# Patient Record
Sex: Female | Born: 1955
Health system: Southern US, Community
[De-identification: ages and names within clinical notes are randomized; demographics above are authoritative.]

## PROBLEM LIST (undated history)

## (undated) DIAGNOSIS — F419 Anxiety disorder, unspecified: Secondary | ICD-10-CM

## (undated) DIAGNOSIS — T7840XA Allergy, unspecified, initial encounter: Secondary | ICD-10-CM

## (undated) DIAGNOSIS — I8390 Asymptomatic varicose veins of unspecified lower extremity: Secondary | ICD-10-CM

## (undated) DIAGNOSIS — M549 Dorsalgia, unspecified: Secondary | ICD-10-CM

## (undated) DIAGNOSIS — N950 Postmenopausal bleeding: Secondary | ICD-10-CM

## (undated) DIAGNOSIS — K219 Gastro-esophageal reflux disease without esophagitis: Secondary | ICD-10-CM

## (undated) DIAGNOSIS — F329 Major depressive disorder, single episode, unspecified: Secondary | ICD-10-CM

## (undated) DIAGNOSIS — F32A Depression, unspecified: Secondary | ICD-10-CM

## (undated) DIAGNOSIS — R51 Headache: Secondary | ICD-10-CM

## (undated) DIAGNOSIS — H269 Unspecified cataract: Secondary | ICD-10-CM

## (undated) DIAGNOSIS — M8580 Other specified disorders of bone density and structure, unspecified site: Secondary | ICD-10-CM

## (undated) HISTORY — PX: APPENDECTOMY: SHX54

## (undated) HISTORY — DX: Asymptomatic varicose veins of unspecified lower extremity: I83.90

## (undated) HISTORY — PX: COLONOSCOPY: SHX174

## (undated) HISTORY — DX: Anxiety disorder, unspecified: F41.9

## (undated) HISTORY — DX: Other specified disorders of bone density and structure, unspecified site: M85.80

## (undated) HISTORY — PX: TONSILLECTOMY: SHX5217

## (undated) HISTORY — DX: Allergy, unspecified, initial encounter: T78.40XA

## (undated) HISTORY — DX: Major depressive disorder, single episode, unspecified: F32.9

## (undated) HISTORY — DX: Depression, unspecified: F32.A

## (undated) HISTORY — DX: Unspecified cataract: H26.9

## (undated) HISTORY — DX: Gastro-esophageal reflux disease without esophagitis: K21.9

---

## 1997-07-04 ENCOUNTER — Other Ambulatory Visit: Admission: RE | Admit: 1997-07-04 | Discharge: 1997-07-04 | Payer: Self-pay | Admitting: Obstetrics and Gynecology

## 1998-08-17 ENCOUNTER — Other Ambulatory Visit: Admission: RE | Admit: 1998-08-17 | Discharge: 1998-08-17 | Payer: Self-pay | Admitting: Obstetrics and Gynecology

## 1998-08-26 ENCOUNTER — Encounter: Payer: Self-pay | Admitting: Emergency Medicine

## 1998-08-26 ENCOUNTER — Inpatient Hospital Stay (HOSPITAL_COMMUNITY): Admission: EM | Admit: 1998-08-26 | Discharge: 1998-08-27 | Payer: Self-pay | Admitting: Emergency Medicine

## 1999-11-12 ENCOUNTER — Other Ambulatory Visit: Admission: RE | Admit: 1999-11-12 | Discharge: 1999-11-12 | Payer: Self-pay | Admitting: Obstetrics and Gynecology

## 1999-11-13 ENCOUNTER — Encounter (INDEPENDENT_AMBULATORY_CARE_PROVIDER_SITE_OTHER): Payer: Self-pay | Admitting: Specialist

## 1999-11-13 ENCOUNTER — Other Ambulatory Visit: Admission: RE | Admit: 1999-11-13 | Discharge: 1999-11-13 | Payer: Self-pay | Admitting: Obstetrics and Gynecology

## 2000-11-17 ENCOUNTER — Other Ambulatory Visit: Admission: RE | Admit: 2000-11-17 | Discharge: 2000-11-17 | Payer: Self-pay | Admitting: Obstetrics and Gynecology

## 2001-11-20 ENCOUNTER — Other Ambulatory Visit: Admission: RE | Admit: 2001-11-20 | Discharge: 2001-11-20 | Payer: Self-pay | Admitting: Obstetrics and Gynecology

## 2002-04-15 DIAGNOSIS — M8580 Other specified disorders of bone density and structure, unspecified site: Secondary | ICD-10-CM

## 2002-04-15 HISTORY — DX: Other specified disorders of bone density and structure, unspecified site: M85.80

## 2003-02-14 ENCOUNTER — Other Ambulatory Visit: Admission: RE | Admit: 2003-02-14 | Discharge: 2003-02-14 | Payer: Self-pay | Admitting: Obstetrics and Gynecology

## 2004-06-04 ENCOUNTER — Ambulatory Visit: Payer: Self-pay | Admitting: Internal Medicine

## 2005-04-24 ENCOUNTER — Ambulatory Visit: Payer: Self-pay | Admitting: Internal Medicine

## 2005-06-03 ENCOUNTER — Ambulatory Visit: Payer: Self-pay | Admitting: Internal Medicine

## 2005-11-04 ENCOUNTER — Ambulatory Visit: Payer: Self-pay | Admitting: Internal Medicine

## 2006-01-03 ENCOUNTER — Ambulatory Visit: Payer: Self-pay | Admitting: Internal Medicine

## 2006-04-25 ENCOUNTER — Ambulatory Visit: Payer: Self-pay | Admitting: Internal Medicine

## 2006-05-20 ENCOUNTER — Ambulatory Visit: Payer: Self-pay | Admitting: Internal Medicine

## 2006-05-20 LAB — CONVERTED CEMR LAB
ALT: 12 units/L (ref 0–40)
AST: 26 units/L (ref 0–37)
Albumin: 3.6 g/dL (ref 3.5–5.2)
Alkaline Phosphatase: 101 units/L (ref 39–117)
BUN: 11 mg/dL (ref 6–23)
Basophils Absolute: 0.1 10*3/uL (ref 0.0–0.1)
Basophils Relative: 1 % (ref 0.0–1.0)
Bilirubin, Direct: 0.1 mg/dL (ref 0.0–0.3)
CO2: 26 meq/L (ref 19–32)
Calcium: 9 mg/dL (ref 8.4–10.5)
Chloride: 110 meq/L (ref 96–112)
Cholesterol: 182 mg/dL (ref 0–200)
Creatinine, Ser: 0.9 mg/dL (ref 0.4–1.2)
Eosinophils Absolute: 0.2 10*3/uL (ref 0.0–0.6)
Eosinophils Relative: 2.9 % (ref 0.0–5.0)
GFR calc Af Amer: 85 mL/min
GFR calc non Af Amer: 70 mL/min
Glucose, Bld: 88 mg/dL (ref 70–99)
HCT: 44.2 % (ref 36.0–46.0)
HDL: 47.9 mg/dL (ref >39.0)
Hemoglobin: 15.1 g/dL — ABNORMAL HIGH (ref 12.0–15.0)
LDL Cholesterol: 117 mg/dL — ABNORMAL HIGH (ref 0–99)
Lymphocytes Relative: 23.9 % (ref 12.0–46.0)
MCHC: 34.1 g/dL (ref 30.0–36.0)
MCV: 89.4 fL (ref 78.0–100.0)
Monocytes Absolute: 0.4 10*3/uL (ref 0.2–0.7)
Monocytes Relative: 5.7 % (ref 3.0–11.0)
Neutro Abs: 4.2 10*3/uL (ref 1.4–7.7)
Neutrophils Relative %: 66.5 % (ref 43.0–77.0)
Platelets: 237 10*3/uL (ref 150–400)
Potassium: 3.9 meq/L (ref 3.5–5.1)
RBC: 4.94 M/uL (ref 3.87–5.11)
RDW: 12.5 % (ref 11.5–14.6)
Sodium: 141 meq/L (ref 135–145)
Total Bilirubin: 0.5 mg/dL (ref 0.3–1.2)
Total CHOL/HDL Ratio: 3.8
Total Protein: 6.6 g/dL (ref 6.0–8.3)
Triglycerides: 88 mg/dL (ref 0–149)
VLDL: 18 mg/dL (ref 0–40)
WBC: 6.4 10*3/uL (ref 4.5–10.5)

## 2006-05-23 ENCOUNTER — Ambulatory Visit: Payer: Self-pay | Admitting: Internal Medicine

## 2006-08-22 ENCOUNTER — Ambulatory Visit: Payer: Self-pay | Admitting: Internal Medicine

## 2007-03-02 ENCOUNTER — Telehealth: Payer: Self-pay | Admitting: Internal Medicine

## 2007-04-06 ENCOUNTER — Encounter (INDEPENDENT_AMBULATORY_CARE_PROVIDER_SITE_OTHER): Payer: Self-pay | Admitting: *Deleted

## 2007-04-21 ENCOUNTER — Ambulatory Visit: Payer: Self-pay | Admitting: Internal Medicine

## 2007-05-01 ENCOUNTER — Ambulatory Visit: Payer: Self-pay | Admitting: Internal Medicine

## 2007-05-05 ENCOUNTER — Encounter: Payer: Self-pay | Admitting: Internal Medicine

## 2007-05-05 ENCOUNTER — Ambulatory Visit: Payer: Self-pay | Admitting: Internal Medicine

## 2007-11-20 ENCOUNTER — Ambulatory Visit: Payer: Self-pay | Admitting: Internal Medicine

## 2007-11-27 ENCOUNTER — Ambulatory Visit: Payer: Self-pay | Admitting: Internal Medicine

## 2007-11-30 LAB — CONVERTED CEMR LAB
ALT: 12 units/L (ref 0–35)
AST: 22 units/L (ref 0–37)
BUN: 10 mg/dL (ref 6–23)
Basophils Absolute: 0.1 10*3/uL (ref 0.0–0.1)
Basophils Relative: 0.9 % (ref 0.0–3.0)
CO2: 27 meq/L (ref 19–32)
Calcium: 8.9 mg/dL (ref 8.4–10.5)
Chloride: 108 meq/L (ref 96–112)
Cholesterol: 203 mg/dL (ref 0–200)
Creatinine, Ser: 0.9 mg/dL (ref 0.4–1.2)
Direct LDL: 141.7 mg/dL
Eosinophils Absolute: 0.2 10*3/uL (ref 0.0–0.7)
Eosinophils Relative: 3 % (ref 0.0–5.0)
GFR calc Af Amer: 85 mL/min
GFR calc non Af Amer: 70 mL/min
Glucose, Bld: 82 mg/dL (ref 70–99)
HCT: 40.6 % (ref 36.0–46.0)
HDL: 53.6 mg/dL (ref >39.0)
Hemoglobin: 14.4 g/dL (ref 12.0–15.0)
Lymphocytes Relative: 28.4 % (ref 12.0–46.0)
MCHC: 35.5 g/dL (ref 30.0–36.0)
MCV: 88.1 fL (ref 78.0–100.0)
Monocytes Absolute: 0.4 10*3/uL (ref 0.1–1.0)
Monocytes Relative: 6.8 % (ref 3.0–12.0)
Neutro Abs: 3.5 10*3/uL (ref 1.4–7.7)
Neutrophils Relative %: 60.9 % (ref 43.0–77.0)
Platelets: 228 10*3/uL (ref 150–400)
Potassium: 4.2 meq/L (ref 3.5–5.1)
RBC: 4.61 M/uL (ref 3.87–5.11)
RDW: 12.6 % (ref 11.5–14.6)
Sodium: 140 meq/L (ref 135–145)
TSH: 1.76 microintl units/mL (ref 0.35–5.50)
Total CHOL/HDL Ratio: 3.8
Triglycerides: 98 mg/dL (ref 0–149)
VLDL: 20 mg/dL (ref 0–40)
WBC: 5.9 10*3/uL (ref 4.5–10.5)

## 2008-03-07 ENCOUNTER — Telehealth: Payer: Self-pay | Admitting: Internal Medicine

## 2008-05-03 ENCOUNTER — Encounter: Payer: Self-pay | Admitting: Internal Medicine

## 2008-07-12 ENCOUNTER — Ambulatory Visit: Payer: Self-pay | Admitting: Internal Medicine

## 2008-08-05 ENCOUNTER — Encounter: Payer: Self-pay | Admitting: Internal Medicine

## 2008-08-13 LAB — CONVERTED CEMR LAB: Pap Smear: NORMAL

## 2008-09-06 ENCOUNTER — Telehealth (INDEPENDENT_AMBULATORY_CARE_PROVIDER_SITE_OTHER): Payer: Self-pay | Admitting: *Deleted

## 2008-09-13 ENCOUNTER — Encounter: Payer: Self-pay | Admitting: Internal Medicine

## 2008-09-20 ENCOUNTER — Encounter: Payer: Self-pay | Admitting: Internal Medicine

## 2008-11-28 ENCOUNTER — Ambulatory Visit: Payer: Self-pay | Admitting: Internal Medicine

## 2008-12-08 ENCOUNTER — Ambulatory Visit: Payer: Self-pay | Admitting: Internal Medicine

## 2008-12-12 LAB — CONVERTED CEMR LAB
ALT: 14 units/L (ref 0–35)
AST: 21 units/L (ref 0–37)
BUN: 13 mg/dL (ref 6–23)
CO2: 27 meq/L (ref 19–32)
Calcium: 8.8 mg/dL (ref 8.4–10.5)
Chloride: 108 meq/L (ref 96–112)
Cholesterol: 214 mg/dL — ABNORMAL HIGH (ref 0–200)
Creatinine, Ser: 0.9 mg/dL (ref 0.4–1.2)
Direct LDL: 145.9 mg/dL
GFR calc non Af Amer: 69.59 mL/min (ref >60)
Glucose, Bld: 85 mg/dL (ref 70–99)
HDL: 52.5 mg/dL (ref >39.00)
Potassium: 4.2 meq/L (ref 3.5–5.1)
Sodium: 141 meq/L (ref 135–145)
TSH: 1.2 microintl units/mL (ref 0.35–5.50)
Total CHOL/HDL Ratio: 4
Triglycerides: 105 mg/dL (ref 0.0–149.0)
VLDL: 21 mg/dL (ref 0.0–40.0)

## 2009-04-21 ENCOUNTER — Telehealth: Payer: Self-pay | Admitting: Internal Medicine

## 2009-05-31 ENCOUNTER — Ambulatory Visit: Payer: Self-pay | Admitting: Internal Medicine

## 2009-06-08 ENCOUNTER — Encounter: Payer: Self-pay | Admitting: Internal Medicine

## 2009-06-08 LAB — HM MAMMOGRAPHY

## 2009-06-21 ENCOUNTER — Encounter: Payer: Self-pay | Admitting: Internal Medicine

## 2009-06-26 ENCOUNTER — Encounter: Payer: Self-pay | Admitting: Internal Medicine

## 2009-09-19 ENCOUNTER — Encounter: Payer: Self-pay | Admitting: Internal Medicine

## 2009-09-19 ENCOUNTER — Telehealth: Payer: Self-pay | Admitting: Internal Medicine

## 2009-11-16 ENCOUNTER — Ambulatory Visit: Payer: Self-pay | Admitting: Internal Medicine

## 2009-11-17 ENCOUNTER — Ambulatory Visit: Payer: Self-pay | Admitting: Internal Medicine

## 2009-11-23 LAB — CONVERTED CEMR LAB
ALT: 13 units/L (ref 0–35)
AST: 21 units/L (ref 0–37)
BUN: 19 mg/dL (ref 6–23)
Basophils Absolute: 0 10*3/uL (ref 0.0–0.1)
Basophils Relative: 0.6 % (ref 0.0–3.0)
Chloride: 108 meq/L (ref 96–112)
Direct LDL: 152.2 mg/dL
Eosinophils Absolute: 0.2 10*3/uL (ref 0.0–0.7)
Lymphocytes Relative: 27.7 % (ref 12.0–46.0)
MCHC: 34 g/dL (ref 30.0–36.0)
Neutrophils Relative %: 63.1 % (ref 43.0–77.0)
Potassium: 4.7 meq/L (ref 3.5–5.1)
RBC: 4.8 M/uL (ref 3.87–5.11)
TSH: 1.4 microintl units/mL (ref 0.35–5.50)
Total CHOL/HDL Ratio: 4
Triglycerides: 59 mg/dL (ref 0.0–149.0)

## 2010-02-05 ENCOUNTER — Telehealth: Payer: Self-pay | Admitting: Internal Medicine

## 2010-05-13 LAB — CONVERTED CEMR LAB
Pap Smear: NEGATIVE
Vit D, 25-Hydroxy: 45.2 ng/mL

## 2010-05-15 NOTE — Progress Notes (Signed)
Summary: Refill Request  Phone Note Refill Request Call back at [phone] Message from:  Pharmacy on September 19, 2009 9:58 AM  Refills Requested: Medication #1:  ALPRAZOLAM 0.5 MG  TABS 1 1/2 qam; 1 by mouth qpm fax [phone]   Dosage confirmed as above?Dosage Confirmed   Supply Requested: 1 month   Last Refilled: 07/20/2009 [facility] [address]  Next Appointment Scheduled: none Initial call taken by: Harold Barban,  September 19, 2009 9:58 AM  Follow-up for Phone Call        last filled, last OV 05-31-09 #60 1..............Marland KitchenFelecia Deloach CMA  September 19, 2009 10:55 AM   ok 60 and 2 RF Jose E. Paz MD  September 19, 2009 5:31 PM     Prescriptions: ALPRAZOLAM 0.5 MG  TABS (ALPRAZOLAM) 1 1/2 qam; 1 by mouth qpm fax [phone]  #60 x 2   Entered by:   Jeremy Johann CMA   Authorized by:   Nolon Rod. Paz MD   Signed by:   Jeremy Johann CMA on 09/20/2009   Method used:   Printed then faxed to ...       [facility] [address] (retail)       [address]       [place], [place]  [address]       Ph: [phone] or [phone]       Fax: [phone]   RxID:   [id number]

## 2010-05-15 NOTE — Progress Notes (Signed)
Summary: Refill--Alprazolam  Phone Note Refill Request Call back at Home Phone [phone] Message from:  Patient on February 05, 2010 8:15 AM  Refills Requested: Medication #1:  ALPRAZOLAM 0.5 MG  TABS 1 1/2 qam; 1 by mouth qpm fax [phone]   Last Refilled: 09/20/2009   Notes: Requesting 90 day supply Patient is changing to [employer] [place]  **ok to left message patient will be at work  Initial call taken by: Lucious Groves CMA,  February 05, 2010 8:15 AM  Follow-up for Phone Call        needs a three-month supply, ok  to call #250, no refills Follow-up by: Jose E. Paz MD,  February 05, 2010 11:48 AM  Additional Follow-up for Phone Call Additional follow up Details #1::        Left message on voicemail notifying patient that prescription was called in. Additional Follow-up by: Lucious Groves CMA,  February 05, 2010 12:13 PM    Prescriptions: ALPRAZOLAM 0.5 MG  TABS (ALPRAZOLAM) 1 1/2 qam; 1 by mouth qpm fax [phone]  #250 x 0   Entered by:   Lucious Groves CMA   Authorized by:   Nolon Rod. Paz MD   Signed by:   Lucious Groves CMA on 02/05/2010   Method used:   Telephoned to ...       [facility]  [address] # [phone]* (retail)       [phone]       [place], [place]  [address]       Ph: [phone]       Fax: [phone]   RxID:   [id number]

## 2010-05-15 NOTE — Progress Notes (Signed)
Summary: RX  Phone Note Refill Request   Refills Requested: Medication #1:  ALPRAZOLAM 0.5 MG  TABS 1 1/2 qam; 1 by mouth qpm fax [phone]   Last Refilled: 09/08/2008 [facility] ON [address]--[id number] [phone]  last ov- 11/28/08- cpx Army Fossa CMA  April 21, 2009 10:22 AM  Initial call taken by: Freddy Jaksch,  April 21, 2009 10:14 AM  Follow-up for Phone Call        LAST RX - #60 with 6 refills 5/10, rov scheduled 05/31/09.  Ok'd #60 -- additional refills @ ov  Follow-up by: Shary Decamp,  April 21, 2009 11:32 AM  Additional Follow-up for Phone Call Additional follow up Details #1::        agree Additional Follow-up by: [facility] E. Paz MD,  April 21, 2009 4:47 PM    Prescriptions: ALPRAZOLAM 0.5 MG  TABS (ALPRAZOLAM) 1 1/2 qam; 1 by mouth qpm fax [phone]  #60 x 0   Entered by:   Shary Decamp   Authorized by:   Nolon Rod. Paz MD   Signed by:   Shary Decamp on 04/21/2009   Method used:   Printed then faxed to ...       [facility] [address] (retail)       [address]       [place], [place]  [address]       Ph: [phone] or [phone]       Fax: [phone]   RxID:   [id number]

## 2010-05-15 NOTE — Letter (Signed)
Summary: planning laser treatment----[facility]  [facility]   Imported By: Lennie Odor 10/10/2009 11:53:56  _____________________________________________________________________  External Attachment:    Type:   Image     Comment:   External Document

## 2010-05-15 NOTE — Assessment & Plan Note (Signed)
Summary: CPX/KN   Vital Signs:  Patient profile:   55 year old female Height:      65 inches Weight:      190.13 pounds Pulse rate:   84 / minute Pulse rhythm:   regular BP sitting:   122 / 76  (left arm) Cuff size:   large  Vitals Entered By: Army Fossa CMA (November 16, 2009 1:37 PM) CC: CPX: Had breakfast at 8 am.   History of Present Illness: complete physical exam    Preventive Screening-Counseling & Management  Caffeine-Diet-Exercise     Does Patient Exercise: yes     Type of exercise: dance     Times/week: 1  Current Medications (verified): 1)  Alprazolam 0.5 Mg  Tabs (Alprazolam) .Marland Kitchen.. 1 1/2 Qam; 1 By Mouth Qpm Fax [phone] 2)  Premphase 0.625-5 Mg Tabs (Conj Estrog-Medroxyprogest Ace) .... As Directed 3)  Vitamin D 1000 Unit Tabs (Cholecalciferol) .... Take 1 Tab Two Times A Day 4)  Calicum  Allergies: 1)  ! Pcn  Past History:  Past Medical History: G2 P2 Anxiety Depression DEXA 2010 :osteopenia hip, gyn Rx Vit D and Ca+ varicose veins   Past Surgical History: Reviewed history from 07/25/2006 and no changes required. Appendectomy Tonsillectomy  Family History: Reviewed history from 11/28/2008 and no changes required. 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 CV:  Denies chest pain or discomfort and shortness of breath with exertion. Resp:  Denies cough and shortness of breath. GI:  Denies bloody stools, diarrhea, nausea, and vomiting. Psych:  doing well as far as her depression is concerned Still having anxiety from time to time, it uses  Xanax p.r.n.Marland Kitchen  Physical Exam  General:  alert, well-developed, and overweight-appearing.   Neck:  no masses, no thyromegaly, and normal carotid upstroke.   Lungs:  normal respiratory effort, no intercostal retractions, no accessory muscle use, and normal breath sounds.   Heart:  normal rate, regular rhythm, no murmur, and no gallop.   Abdomen:  soft, non-tender, no distention, no masses, no guarding, and no rigidity.   Extremities:  no pretibial edema bilaterally  Neurologic:  alert & oriented X3.   Psych:  memory intact for recent and remote, normally interactive, good eye contact, not anxious appearing, and not depressed appearing.     Impression & Recommendations:  Problem # 1:  HEALTH SCREENING (ICD-V70.0) Td 2010 Cscope neg 1-09 labs  diet is healthy encouraged exercise sees gynecology mammogram negative 2-11 Pap Smear negative 3-11  Problem # 2:  ANXIETY (ICD-300.00) Well controlled , refill Xanax as needed Her updated medication list for this problem includes:    Alprazolam 0.5 Mg Tabs (Alprazolam) .Marland Kitchen... 1 1/2 qam; 1 by mouth qpm fax [phone]  Complete Medication List: 1)  Alprazolam 0.5 Mg Tabs (Alprazolam) .Marland Kitchen.. 1 1/2 qam; 1 by mouth qpm fax [phone] 2)  Premphase 0.625-5 Mg Tabs (Conj estrog-medroxyprogest ace) .... As directed 3)  Vitamin D 1000 Unit Tabs (Cholecalciferol) .... Take 1 tab two times a day 4)  Calicum   Patient Instructions: 1)  please come back fasting 2)  FLP,  AST, ALT, CBC, BMP, TSH---dx v70 3)  Please schedule a follow-up appointment in 1 year.    Preventive Care Screening  Mammogram:    Date:  05/16/2009    Results:  normal   Pap Smear:    Date:  05/16/2009    Results:  normal     Risk Factors:  Exercise:  yes    Times per week:  1    Type:  dance  Mammogram History:     Date of Last Mammogram:  05/16/2009    Results:  normal   PAP Smear History:     Date of Last PAP Smear:  05/16/2009    Results:  normal

## 2010-05-15 NOTE — Assessment & Plan Note (Signed)
Summary: 6 mth fu/kdc   Vital Signs:  Patient profile:   55 year old female Height:      65 inches Weight:      194.6 pounds BMI:     32.50 Pulse rate:   60 / minute Pulse rhythm:   regular BP sitting:   140 / 82  (left arm) Cuff size:   large  Vitals Entered By: Shary Decamp (May 31, 2009 3:25 PM) CC: rov   History of Present Illness: ROV,  doing well  Current Medications (verified): 1)  Alprazolam 0.5 Mg  Tabs (Alprazolam) .Marland Kitchen.. 1 1/2 Qam; 1 By Mouth Qpm Fax [phone] 2)  Premphase 0.625-5 Mg Tabs (Conj Estrog-Medroxyprogest Ace) .... As Directed 3)  Vitamin D 1000 Unit Tabs (Cholecalciferol) .... Take 1 Tab Two Times A Day  Allergies (verified): 1)  ! Pcn  Past History:  Past Medical History: G2 P2 Anxiety Depression DEXA 2010 :osteopenia hip, gyn Rx Vit D and Ca+  Past Surgical History: Reviewed history from 07/25/2006 and no changes required. Appendectomy Tonsillectomy  Social History: Reviewed history from 11/28/2008 and no changes required. 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   Ears:  R ear normal and L ear normal.   Lungs:  normal respiratory effort, no intercostal retractions, no accessory muscle use, and normal breath sounds.   Psych:  Cognition and judgment appear intact. Alert and cooperative with normal attention span and concentration.    Impression & Recommendations:  Problem #  1:  DEPRESSION (ICD-311) doing well without Wellbutrin The following medications were removed from the medication list:    Wellbutrin 75 Mg Tabs (Bupropion hcl) .Marland Kitchen... 1 by mouth two times a day Her updated medication list for this problem includes:    Alprazolam 0.5 Mg Tabs (Alprazolam) .Marland Kitchen... 1 1/2 qam; 1 by mouth qpm fax [phone]  Problem # 2:  ANXIETY (ICD-300.00) uses Xanax rarely, uses it more to help her sleep than for anxiety RF  Her updated medication list for this problem includes:    Alprazolam 0.5 Mg Tabs (Alprazolam) .Marland Kitchen... 1 1/2 qam; 1 by mouth qpm fax [phone]  Problem # 3:  URI (ICD-465.9) getting better  Complete Medication List: 1)  Alprazolam 0.5 Mg Tabs (Alprazolam) .Marland Kitchen.. 1 1/2 qam; 1 by mouth qpm fax [phone] 2)  Premphase 0.625-5 Mg Tabs (Conj estrog-medroxyprogest ace) .... As directed 3)  Vitamin D 1000 Unit Tabs (Cholecalciferol) .... Take 1 tab two times a day  Patient Instructions: 1)  Please schedule a follow-up appointment in 6-7  months (physical) Prescriptions: ALPRAZOLAM 0.5 MG  TABS (ALPRAZOLAM) 1 1/2 qam; 1 by mouth qpm fax [phone]  #60 x 1   Entered and Authorized by:   Nolon Rod. Natasia Sanko MD   Signed by:   Nolon Rod. Alcide Memoli MD on 05/31/2009   Method used:   Print then Give to Patient   RxID:  [id number]  

## 2010-05-15 NOTE — Miscellaneous (Signed)
Summary: neg pap, Dr. Maxie Better   Clinical Lists Changes  Observations: Added new observation of PAP SMEAR: Interpretation/Result:Negative for intraepithelial Lesion or Malignancy.    (06/21/2009 10:20)      Pap Smear  Procedure date:  06/21/2009  Findings:      Interpretation/Result:Negative for intraepithelial Lesion or Malignancy.     Comments:      Repeat Pap in 1 year.     Pap Smear  Procedure date:  06/21/2009  Findings:      Interpretation/Result:Negative for intraepithelial Lesion or Malignancy.     Comments:      Repeat Pap in 1 year.

## 2010-06-11 ENCOUNTER — Encounter: Payer: Self-pay | Admitting: Internal Medicine

## 2010-06-25 ENCOUNTER — Other Ambulatory Visit: Payer: Self-pay | Admitting: Obstetrics and Gynecology

## 2010-08-19 ENCOUNTER — Other Ambulatory Visit: Payer: Self-pay | Admitting: Internal Medicine

## 2010-08-20 NOTE — Telephone Encounter (Signed)
Ok 250, no RF (3 months supply)

## 2010-08-27 ENCOUNTER — Other Ambulatory Visit: Payer: Self-pay | Admitting: Internal Medicine

## 2010-08-30 ENCOUNTER — Telehealth: Payer: Self-pay | Admitting: Internal Medicine

## 2010-08-30 ENCOUNTER — Other Ambulatory Visit: Payer: Self-pay | Admitting: Internal Medicine

## 2010-08-30 NOTE — Telephone Encounter (Signed)
Refill on Dr.Paz's desktop.

## 2010-08-30 NOTE — Telephone Encounter (Signed)
She needs a 90 day supply, #250, no RF

## 2010-08-30 NOTE — Telephone Encounter (Signed)
Patient said she has called [facility] [address] for over a week & they state they havn't received refill for alprazolam - patient wanted to know if it can be called in & call her to let her know  When it was called in

## 2011-01-04 ENCOUNTER — Encounter: Payer: Self-pay | Admitting: Internal Medicine

## 2011-01-11 ENCOUNTER — Encounter: Payer: Self-pay | Admitting: Internal Medicine

## 2011-01-11 ENCOUNTER — Ambulatory Visit (INDEPENDENT_AMBULATORY_CARE_PROVIDER_SITE_OTHER): Payer: 59 | Admitting: Internal Medicine

## 2011-01-11 DIAGNOSIS — F329 Major depressive disorder, single episode, unspecified: Secondary | ICD-10-CM | POA: Insufficient documentation

## 2011-01-11 DIAGNOSIS — Z0000 Encounter for general adult medical examination without abnormal findings: Secondary | ICD-10-CM | POA: Insufficient documentation

## 2011-01-11 DIAGNOSIS — F32A Depression, unspecified: Secondary | ICD-10-CM | POA: Insufficient documentation

## 2011-01-11 MED ORDER — ALPRAZOLAM 0.5 MG PO TABS: ORAL_TABLET | ORAL | Status: DC

## 2011-01-11 NOTE — Progress Notes (Signed)
  Subjective:    Patient ID: Jessica Huang, female    DOB: 1955/09/12, 55 y.o.   MRN: [id number]  HPI Complete physical exam  Past Medical History  Diagnosis Date  . Anxiety and depression   . Varicose veins   . Osteopenia 2004    Dexa, hip   Past Surgical History  Procedure Date  . Appendectomy   . Tonsillectomy    History   Social History  . Marital Status: Married    Spouse Name: N/A    Number of Children: 2  . Years of Education: N/A   Occupational History  . Private school teacher, 7th grade    Social History Main Topics  . Smoking status: Never Smoker   . Smokeless tobacco: Never Used  . Alcohol Use: Yes     rarely   . Drug Use: No  . Sexually Active: Not on file   Other Topics Concern  . Not on file   Social History Narrative   Regular exercise- X 2 week, activeDiet-- healthy divorce, ex-husband is bipolar   Family History  Problem Relation Age of Onset  . Other Mother     valve replacement--M  . Breast cancer Mother     M late 87s  . COPD Father     smoker  . Heart attack Neg Hx   . Colon cancer Neg Hx   . Heart failure      M  . Diabetes      GPs , late onset    Review of Systems Denies any chest pain or shortness of breath No nausea, vomiting, diarrhea or blood in the stools. They discontinued her hormone replacement therapy about 9 months ago, since then, gradually has developed episodes of feeling sad, crying, feeling blue. Symptoms are mostly during the weekends when she is no busy, no suicidal ideas    Objective:   Physical Exam  Constitutional: She is oriented to person, place, and time. She appears well-developed. No distress.  HENT:  Head: Normocephalic and atraumatic.  Neck: No thyromegaly present.  Cardiovascular: Normal rate, regular rhythm and normal heart sounds.   No murmur heard. Pulmonary/Chest: Effort normal and breath sounds normal. No respiratory distress. She has no wheezes. She has no rales.  Abdominal: Soft. Bowel  sounds are normal. She exhibits no distension. There is no tenderness. There is no rebound and no guarding.  Musculoskeletal: She exhibits no edema.  Neurological: She is alert and oriented to person, place, and time.  Skin: Skin is warm and dry. She is not diaphoretic.  Psychiatric: She has a normal mood and affect. Her behavior is normal. Judgment and thought content normal.          Assessment & Plan:

## 2011-01-11 NOTE — Patient Instructions (Signed)
Please come back fasting: FLP, TSH, CBC, CMP====dx v70

## 2011-01-11 NOTE — Assessment & Plan Note (Addendum)
She was on Wellbutrin up to  2 years ago and was doing well  Few months ago  her hormone replacement therapy was d/c , since then having episodes of mild depression . Plan:  Counseled today She  sees a counselor once a month, has not discuss this issue with her. As a first step I recommend discussion with a counselor, consider medication. Patient will let me know

## 2011-01-11 NOTE — Assessment & Plan Note (Addendum)
Td 2010 Shingles immunization discussed Cscope neg 1-09 labs  diet is healthy Encouraged exercise  Female care: sees gynecology

## 2011-01-16 ENCOUNTER — Other Ambulatory Visit: Payer: Self-pay | Admitting: Internal Medicine

## 2011-01-16 DIAGNOSIS — Z0000 Encounter for general adult medical examination without abnormal findings: Secondary | ICD-10-CM

## 2011-01-17 ENCOUNTER — Other Ambulatory Visit (INDEPENDENT_AMBULATORY_CARE_PROVIDER_SITE_OTHER): Payer: 59

## 2011-01-17 DIAGNOSIS — Z0000 Encounter for general adult medical examination without abnormal findings: Secondary | ICD-10-CM

## 2011-01-17 LAB — CBC WITH DIFFERENTIAL/PLATELET
Basophils Absolute: 0 10*3/uL (ref 0.0–0.1)
Basophils Relative: 0.6 % (ref 0.0–3.0)
Eosinophils Absolute: 0.2 10*3/uL (ref 0.0–0.7)
MCHC: 33.8 g/dL (ref 30.0–36.0)
MCV: 90.4 fl (ref 78.0–100.0)
Monocytes Absolute: 0.4 10*3/uL (ref 0.1–1.0)
Neutrophils Relative %: 63.8 % (ref 43.0–77.0)
Platelets: 207 10*3/uL (ref 150.0–400.0)
RBC: 4.86 Mil/uL (ref 3.87–5.11)
RDW: 12.8 % (ref 11.5–14.6)

## 2011-01-17 LAB — COMPREHENSIVE METABOLIC PANEL
AST: 17 U/L (ref 0–37)
Albumin: 4.1 g/dL (ref 3.5–5.2)
Alkaline Phosphatase: 92 U/L (ref 39–117)
BUN: 16 mg/dL (ref 6–23)
Potassium: 4 mEq/L (ref 3.5–5.1)

## 2011-01-17 LAB — LIPID PANEL
Cholesterol: 192 mg/dL (ref 0–200)
LDL Cholesterol: 117 mg/dL — ABNORMAL HIGH (ref 0–99)
Total CHOL/HDL Ratio: 3
Triglycerides: 78 mg/dL (ref 0.0–149.0)
VLDL: 15.6 mg/dL (ref 0.0–40.0)

## 2011-01-17 NOTE — Progress Notes (Signed)
Labs only

## 2011-02-08 ENCOUNTER — Telehealth: Payer: Self-pay | Admitting: Internal Medicine

## 2011-02-08 MED ORDER — BUPROPION HCL ER (XL) 150 MG PO TB24: ORAL_TABLET | ORAL | Status: DC

## 2011-02-08 NOTE — Telephone Encounter (Signed)
Patient aware rx sent in  

## 2011-02-08 NOTE — Telephone Encounter (Signed)
Patient discussed depression at cpx - patient tried to coupe be feels she needs rx - she has taken wellbutrin in the past with success - [facility] [address] [place]

## 2011-02-08 NOTE — Telephone Encounter (Signed)
Please call Wellbutrin XL 150 mg : one by mouth for 2 weeks, then increase to one tablet twice a day. Office visit in 4 weeks.

## 2011-03-25 ENCOUNTER — Ambulatory Visit (INDEPENDENT_AMBULATORY_CARE_PROVIDER_SITE_OTHER): Payer: 59 | Admitting: Internal Medicine

## 2011-03-25 VITALS — BP 120/82 | HR 69 | Temp 98.5°F | Ht 65.0 in | Wt 187.0 lb

## 2011-03-25 DIAGNOSIS — J069 Acute upper respiratory infection, unspecified: Secondary | ICD-10-CM

## 2011-03-25 MED ORDER — AZITHROMYCIN 250 MG PO TABS: ORAL_TABLET | ORAL | Status: AC

## 2011-03-25 NOTE — Progress Notes (Signed)
  Subjective:    Patient ID: Jessica Huang, female    DOB: [birth year], 55 y.o.   MRN: [id number]  HPI One-week history of headache, nasal congestion, postnasal dripping, . She did have a flu shot. She is taking Sudafed behind the counter with some relief.  Past Medical History  Diagnosis Date  . Anxiety and depression   . Varicose veins   . Osteopenia 2004    Dexa, hip     Review of Systems No fever chills No nausea, vomiting, diarrhea or myalgias No sore throat Some cough, she believes due to postnasal dripping. No chest congestion    Objective:   Physical Exam  Constitutional: She appears well-developed and well-nourished. No distress.  HENT:  Head: Normocephalic and atraumatic.  Right Ear: External ear normal.  Left Ear: External ear normal.  Mouth/Throat: No oropharyngeal exudate.       Face is not tender to palpation  Cardiovascular: Normal rate, regular rhythm and normal heart sounds.   No murmur heard. Pulmonary/Chest: Effort normal and breath sounds normal. No respiratory distress. She has no wheezes. She has no rales.  Skin: She is not diaphoretic.      Assessment & Plan:  URI: Symptoms consistent with URI, see instructions

## 2011-03-25 NOTE — Patient Instructions (Signed)
Rest, fluids , tylenol For cough, take Mucinex DM twice a day as needed  For congestion use Sudafed (pseudoephedrine) behind the counter 30 mg every 4 to 6 hours as needed If no better in few days, take the antibiotic as prescribed ----> zithromax Call anytime if the symptoms are severe or not well in 2 weeks

## 2011-06-28 ENCOUNTER — Encounter: Payer: Self-pay | Admitting: Internal Medicine

## 2011-08-13 ENCOUNTER — Other Ambulatory Visit: Payer: Self-pay | Admitting: Internal Medicine

## 2011-08-13 NOTE — Telephone Encounter (Signed)
Refill done.  

## 2011-08-30 ENCOUNTER — Other Ambulatory Visit: Payer: Self-pay | Admitting: Internal Medicine

## 2011-08-30 NOTE — Telephone Encounter (Signed)
#  90 with 3 refills. Last filled on 9.28.12. OK to refill?

## 2011-09-05 NOTE — Telephone Encounter (Signed)
Refill done.  

## 2011-09-05 NOTE — Telephone Encounter (Signed)
90, 2 RF 

## 2011-11-19 ENCOUNTER — Telehealth: Payer: Self-pay | Admitting: Internal Medicine

## 2011-11-19 MED ORDER — ALPRAZOLAM 0.5 MG PO TABS: ORAL_TABLET | ORAL | Status: DC

## 2011-11-19 NOTE — Telephone Encounter (Signed)
90, 3 Rf

## 2011-11-19 NOTE — Telephone Encounter (Signed)
Refill done.  

## 2011-11-19 NOTE — Telephone Encounter (Signed)
Refill: Alprazolam 0.5mg  tab. Take one and one-half tablets by mouth in the morning, then one at bedtime as needed. Qty 90. Last fill 09-05-11

## 2011-11-19 NOTE — Telephone Encounter (Signed)
Ok to refill 

## 2011-12-02 ENCOUNTER — Ambulatory Visit
Admission: RE | Admit: 2011-12-02 | Discharge: 2011-12-02 | Disposition: A | Discharge status: Home / Self Care | Payer: 59 | Source: Ambulatory Visit | Attending: Obstetrics and Gynecology | Admitting: Obstetrics and Gynecology

## 2011-12-02 ENCOUNTER — Other Ambulatory Visit: Payer: Self-pay | Admitting: Obstetrics and Gynecology

## 2011-12-02 DIAGNOSIS — T148XXA Other injury of unspecified body region, initial encounter: Secondary | ICD-10-CM

## 2012-01-03 ENCOUNTER — Ambulatory Visit (INDEPENDENT_AMBULATORY_CARE_PROVIDER_SITE_OTHER): Payer: 59 | Admitting: Internal Medicine

## 2012-01-03 VITALS — BP 138/86 | HR 72 | Temp 98.0°F | Wt 200.0 lb

## 2012-01-03 DIAGNOSIS — M7710 Lateral epicondylitis, unspecified elbow: Secondary | ICD-10-CM

## 2012-01-03 DIAGNOSIS — IMO0002 Reserved for concepts with insufficient information to code with codable children: Secondary | ICD-10-CM

## 2012-01-03 MED ORDER — PREDNISONE 10 MG PO TABS: ORAL_TABLET | ORAL | Status: DC

## 2012-01-03 NOTE — Patient Instructions (Addendum)
No heavy lifting Prednisone for a few days as prescribed Tylenol as needed for pain Call if you're not improving in the next 2 or 3 weeks

## 2012-01-03 NOTE — Progress Notes (Signed)
  Subjective:    Patient ID: Jessica Huang, female    DOB: Sep 05, 1955, 56 y.o.   MRN: [id number]  HPI Acute visit Right elbow pain developed 2 weeks ago after she did a lot of yard  work. Pain was initially only located at the elbow and now it is radiating down to the forearm. Pain increased by grasping or picking up things with her right hand. Took some Advil and use icy-hot with mild help.  Past medical history Anxiety and depression Varicose veins Osteopenia, bone density test 2004  Review of Systems Denies neck pain or hand tingling.     Objective:   Physical Exam General -- alert, well-developed  Extremities-- Left elbow and hand normal Right elbow without deformities, lateral epicondyle tender to palpation, no swollen or red. The olecranon is normal Right wrist and hand normal to inspection on palpation. Psych-- Cognition and judgment appear intact. Alert and cooperative with normal attention span and concentration.  not anxious appearing and not depressed appearing.       Assessment & Plan:

## 2012-01-03 NOTE — Assessment & Plan Note (Addendum)
Epicondylitis likely related to recent yard work. Advil has not helped much. Plan: Anti-inflamatory treatment with prednisone ice If not better will call for a sports medicine referral, she may benefit from a local ejection

## 2012-01-08 ENCOUNTER — Encounter: Payer: Self-pay | Admitting: Internal Medicine

## 2012-01-20 ENCOUNTER — Telehealth: Payer: Self-pay | Admitting: Internal Medicine

## 2012-01-20 DIAGNOSIS — M25529 Pain in unspecified elbow: Secondary | ICD-10-CM

## 2012-01-20 NOTE — Telephone Encounter (Signed)
Please advise 

## 2012-01-20 NOTE — Telephone Encounter (Signed)
Referral entered  

## 2012-01-20 NOTE — Telephone Encounter (Signed)
Please arrange, Dx elbow pain

## 2012-01-20 NOTE — Telephone Encounter (Signed)
Pt states she is not better and is interested in the sports medicine referral. She would prefer to go to Dr. Pearletha Forge at [facility].

## 2012-01-22 ENCOUNTER — Ambulatory Visit: Payer: 59 | Admitting: Family Medicine

## 2012-01-23 ENCOUNTER — Ambulatory Visit: Payer: 59 | Admitting: Family Medicine

## 2012-01-28 ENCOUNTER — Encounter: Payer: Self-pay | Admitting: Family Medicine

## 2012-01-28 ENCOUNTER — Ambulatory Visit (INDEPENDENT_AMBULATORY_CARE_PROVIDER_SITE_OTHER): Payer: 59 | Admitting: Family Medicine

## 2012-01-28 VITALS — BP 151/89 | HR 80 | Ht 65.0 in | Wt 195.0 lb

## 2012-01-28 DIAGNOSIS — IMO0002 Reserved for concepts with insufficient information to code with codable children: Secondary | ICD-10-CM

## 2012-01-28 DIAGNOSIS — S56919A Strain of unspecified muscles, fascia and tendons at forearm level, unspecified arm, initial encounter: Secondary | ICD-10-CM

## 2012-01-28 NOTE — Patient Instructions (Addendum)
You have strained your right forearm extensor muscles. Unfortunately a cortisone shot is not an option in this location because it's a wide area (as opposed to when people have epicondylitis up at the elbow focally). Ice area 15 minutes at a time 3-4 times a day. Start physical therapy including iontophoresis. Do home exercises on days you don't go to physical therapy. Wrist brace may help with preventing extension that can aggravate this. Ibuprofen 600mg  three times a day OR aleve 2 tabs twice a day with food for pain and inflammation. Ok to take tylenol in addition to this (2 tabs three times a day). Follow up with me in 6 weeks for reevaluation.

## 2012-01-29 ENCOUNTER — Encounter: Payer: Self-pay | Admitting: Family Medicine

## 2012-01-29 NOTE — Assessment & Plan Note (Signed)
Right forearm strain - No tenderness at lateral epicondyle where would expect tennis elbow.  And advised her unfortunately with diffuse forearm pain/strain cortisone injection unlikely to be helpful.  Start with formal PT including ionto.  Icing, ibuprofen.  Will buy OTC wrist brace to help prevent wrist extension which is one of the motions that worsens her pain.  F/u in 6 weeks for reevaluation.

## 2012-01-29 NOTE — Progress Notes (Signed)
Subjective:    Patient ID: Jessica Huang, female    DOB: 1955-09-08, 56 y.o.   MRN: [id number]  PCP: Dr. Drue Novel  HPI 56 yo F here for right elbow/forearm injury.  Patient reports about 1 month ago while picking up a ladder she felt a pull in right forearm/elbow area. Able to finish carrying this but seemed to worsen later that day and has persisted. Now has pain when picking things up, turning to outside within forearm and mostly outside right elbow. Is right handed. Took a prednisone 6 day pack which helped a little. Has tried icing, aleve, massage. No prior problems.  Past Medical History  Diagnosis Date  . Anxiety and depression   . Varicose veins   . Osteopenia 2004    Dexa, hip    Current Outpatient Prescriptions on File Prior to Visit  Medication Sig Dispense Refill  . ALPRAZolam (XANAX) 0.5 MG tablet Take one & one-half tablets by mouth daily in the morning & when at bedtime as needed.  90 tablet  3  . buPROPion (WELLBUTRIN XL) 150 MG 24 hr tablet TAKE ONE TABLET BY MOUTH EVERY DAY FOR 2 WEEKS THEN INCREASE TO 2 BY MOUTH DAILY  60 tablet  5  . CALCIUM PO Take by mouth.        . cholecalciferol (VITAMIN D) 1000 UNITS tablet Take 1,000 Units by mouth 2 (two) times daily.          Past Surgical History  Procedure Date  . Appendectomy   . Tonsillectomy     Allergies  Allergen Reactions  . Penicillins     History   Social History  . Marital Status: Divorced    Spouse Name: N/A    Number of Children: 2  . Years of Education: N/A   Occupational History  . Private school teacher, 7th grade    Social History Main Topics  . Smoking status: Never Smoker   . Smokeless tobacco: Never Used  . Alcohol Use: Yes     rarely   . Drug Use: No  . Sexually Active: Not on file   Other Topics Concern  . Not on file   Social History Narrative   Regular exercise- X 2 week, activeDiet-- healthy divorce, ex-husband is bipolar    Family History  Problem Relation Age of  Onset  . Other Mother     valve replacement--M  . Breast cancer Mother     M late 33s  . COPD Father     smoker  . Heart attack Neg Hx   . Colon cancer Neg Hx   . Heart failure      M  . Diabetes      GPs , late onset    BP 151/89  Pulse 80  Ht 5\' 5"  (1.651 m)  Wt 195 lb (88.451 kg)  BMI 32.45 kg/m2   Review of Systems See HPI above.    Objective:   Physical Exam Gen: NAD  R elbow: No gross deformity, swelling, bruising. TTP extensors of forearm.  No lateral epicondyle tenderness.  Minimal tenderness medial epicondyle.  No other TTP about elbow, wrist. FROM. Collateral ligaments intact. Pain reproduced with resisted wrist and 3rd finger extension but pain is within forearm, not at lat epicondyle. NVI distally. Negative tinels radial and cubital tunnels.    Assessment & Plan:  1. Right forearm strain - No tenderness at lateral epicondyle where would expect tennis elbow.  And advised her unfortunately with diffuse forearm  pain/strain cortisone injection unlikely to be helpful.  Start with formal PT including ionto.  Icing, ibuprofen.  Will buy OTC wrist brace to help prevent wrist extension which is one of the motions that worsens her pain.  F/u in 6 weeks for reevaluation.

## 2012-01-30 MED ORDER — TRAMADOL HCL 50 MG PO TABS: 50.0000 mg | ORAL_TABLET | Freq: Three times a day (TID) | ORAL | Status: DC | PRN

## 2012-01-30 NOTE — Addendum Note (Signed)
Addended by: Lenda Kelp on: 01/30/2012 03:42 PM   Modules accepted: Orders

## 2012-02-03 ENCOUNTER — Ambulatory Visit: Payer: 59 | Attending: Family Medicine | Admitting: Rehabilitation

## 2012-02-03 DIAGNOSIS — M25539 Pain in unspecified wrist: Secondary | ICD-10-CM | POA: Insufficient documentation

## 2012-02-03 DIAGNOSIS — IMO0001 Reserved for inherently not codable concepts without codable children: Secondary | ICD-10-CM | POA: Insufficient documentation

## 2012-02-08 ENCOUNTER — Other Ambulatory Visit: Payer: Self-pay | Admitting: Internal Medicine

## 2012-02-10 NOTE — Telephone Encounter (Signed)
Refill done.  

## 2012-02-11 ENCOUNTER — Ambulatory Visit: Payer: 59 | Admitting: Rehabilitation

## 2012-02-13 ENCOUNTER — Ambulatory Visit: Payer: 59 | Admitting: Rehabilitation

## 2012-02-18 ENCOUNTER — Ambulatory Visit: Payer: 59 | Attending: Family Medicine | Admitting: Rehabilitation

## 2012-02-18 DIAGNOSIS — IMO0001 Reserved for inherently not codable concepts without codable children: Secondary | ICD-10-CM | POA: Insufficient documentation

## 2012-02-18 DIAGNOSIS — M25539 Pain in unspecified wrist: Secondary | ICD-10-CM | POA: Insufficient documentation

## 2012-02-20 ENCOUNTER — Ambulatory Visit: Payer: 59 | Admitting: Rehabilitation

## 2012-02-24 ENCOUNTER — Encounter: Payer: Self-pay | Admitting: Internal Medicine

## 2012-02-24 ENCOUNTER — Ambulatory Visit (INDEPENDENT_AMBULATORY_CARE_PROVIDER_SITE_OTHER): Payer: 59 | Admitting: Internal Medicine

## 2012-02-24 VITALS — BP 144/82 | HR 74 | Temp 98.1°F | Ht 65.5 in | Wt 199.0 lb

## 2012-02-24 DIAGNOSIS — F329 Major depressive disorder, single episode, unspecified: Secondary | ICD-10-CM

## 2012-02-24 DIAGNOSIS — F32A Depression, unspecified: Secondary | ICD-10-CM

## 2012-02-24 DIAGNOSIS — Z0000 Encounter for general adult medical examination without abnormal findings: Secondary | ICD-10-CM

## 2012-02-24 LAB — LDL CHOLESTEROL, DIRECT: Direct LDL: 128.2 mg/dL

## 2012-02-24 LAB — COMPREHENSIVE METABOLIC PANEL
Albumin: 3.8 g/dL (ref 3.5–5.2)
Alkaline Phosphatase: 104 U/L (ref 39–117)
BUN: 14 mg/dL (ref 6–23)
GFR: 56.93 mL/min — ABNORMAL LOW (ref >60.00)
Glucose, Bld: 89 mg/dL (ref 70–99)
Potassium: 4.1 mEq/L (ref 3.5–5.1)
Total Bilirubin: 0.5 mg/dL (ref 0.3–1.2)

## 2012-02-24 LAB — LIPID PANEL
Cholesterol: 201 mg/dL — ABNORMAL HIGH (ref 0–200)
HDL: 58.3 mg/dL (ref >39.00)
VLDL: 15.6 mg/dL (ref 0.0–40.0)

## 2012-02-24 MED ORDER — FLUOXETINE HCL 20 MG PO TABS: 20.0000 mg | ORAL_TABLET | Freq: Every day | ORAL | Status: DC

## 2012-02-24 MED ORDER — BUPROPION HCL ER (XL) 150 MG PO TB24: 300.0000 mg | ORAL_TABLET | ORAL | Status: DC

## 2012-02-24 MED ORDER — FLUTICASONE PROPIONATE 50 MCG/ACT NA SUSP: 2.0000 | Freq: Every day | NASAL | Status: DC

## 2012-02-24 NOTE — Progress Notes (Signed)
  Subjective:    Patient ID: Jessica Huang, female    DOB: [birth year], 56 y.o.   MRN: [id number]  HPI CPX  Past Medical History  Diagnosis Date  . Anxiety and depression   . Varicose veins   . Osteopenia 2004    Dexa, hip   Past Surgical History  Procedure Date  . Appendectomy   . Tonsillectomy    Family History  Problem Relation Age of Onset  . Other Mother     valve replacement--M  . Breast cancer Mother     M late 65s  . COPD Father     smoker  . Heart attack Neg Hx   . Colon cancer Neg Hx   . Heart failure      M  . Diabetes      GPs , late onset   History   Social History  . Marital Status: Divorced    Spouse Name: N/A    Number of Children: 2  . Years of Education: N/A   Occupational History  . Private school teacher, 7th grade    Social History Main Topics  . Smoking status: Never Smoker   . Smokeless tobacco: Never Used  . Alcohol Use: Yes     Comment: rarely   . Drug Use: No  . Sexually Active: Not on file   Other Topics Concern  . Not on file   Social History Narrative   Regular exercise non lately ----Diet: not  healthy ---divorce, 1 her children lives at home     Review of Systems Reports anxiety and depression are not well controlled despite taking Wellbutrin 2 tablets daily. Has crying spells, feeling sad and down. During the weekdays when she is busy she is mostly okay, during the weekends she feels that she does not like to do anything. Denies any suicidal thoughts. See assessment and plan. Also, for few months has noted a cough mostly in the morning, she does have some phlegm in the throat, color?. Denies any itchy eyes or itchy nose. Denies chest congestion and wheezing. Claritin over-the-counter help to some extent. Also having right arm pain, doing physical therapy and taking ultram.     Objective:   Physical Exam General -- alert, well-developed, and overweight appearing. No apparent distress.  HEENT -- Tnose slt congested    Lungs -- normal respiratory effort, no intercostal retractions, no accessory muscle use, and normal breath sounds.   Heart-- normal rate, regular rhythm, no murmur, and no gallop.   Abdomen--soft, non-tender, no distention, no masses, no HSM, no guarding, and no rigidity.   Extremities-- no pretibial edema bilaterally  Neurologic-- alert & oriented X3 and strength normal in all extremities. Psych-- Cognition and judgment appear intact. Alert and cooperative with normal attention span and concentration.  Emotional during the visit, tearful at times .      Assessment & Plan:

## 2012-02-24 NOTE — Assessment & Plan Note (Addendum)
Td 2010 Had a flu shot  Shingles immunization pending, concerned about cost, rec to discuss w/ insurance  Cscope neg 1-09 labs  Female care: sees gynecology , up date on pap, mmg  Currently not eating healthy or exercising, mostly due to depression. Patient is counseled.

## 2012-02-24 NOTE — Patient Instructions (Addendum)
Think about Zostavax, the  shingles shot Continue with Wellbutrin 2 tablets a day Fluoxetine half tablet the first week, then one tablet every day. ---- Continue Claritin every day as needed Flonase 2 sprays on each side of the nose daily ---- Return in 4 to 5 weeks

## 2012-02-24 NOTE — Assessment & Plan Note (Addendum)
Symptoms not well-controlled, worse during  weekends when she is not busy. She is divorced, one of her childhood lives with her but she does not see her future as bright. PHQ. 9---> #14 , moderate depression Plan: Definitely needs counseling, continue welbutrin, add prozac Counseled today

## 2012-02-25 ENCOUNTER — Encounter: Payer: 59 | Admitting: Rehabilitation

## 2012-02-26 ENCOUNTER — Encounter: Payer: Self-pay | Admitting: *Deleted

## 2012-02-27 ENCOUNTER — Encounter: Payer: 59 | Admitting: Rehabilitation

## 2012-03-03 ENCOUNTER — Encounter: Payer: 59 | Admitting: Rehabilitation

## 2012-03-05 ENCOUNTER — Encounter: Payer: 59 | Admitting: Rehabilitation

## 2012-03-10 ENCOUNTER — Encounter: Payer: 59 | Admitting: Rehabilitation

## 2012-04-15 HISTORY — PX: VARICOSE VEIN SURGERY: SHX832

## 2012-05-15 ENCOUNTER — Other Ambulatory Visit: Payer: Self-pay | Admitting: Internal Medicine

## 2012-05-15 NOTE — Telephone Encounter (Signed)
Refill done.  

## 2012-07-04 ENCOUNTER — Telehealth: Payer: Self-pay | Admitting: Internal Medicine

## 2012-07-06 ENCOUNTER — Encounter: Payer: Self-pay | Admitting: *Deleted

## 2012-07-06 NOTE — Telephone Encounter (Signed)
Done Needs controlled substance agreement and urine test

## 2012-07-06 NOTE — Telephone Encounter (Signed)
Ok to refill? Last OV 11.11.13 Last filled 8.6.13

## 2012-07-06 NOTE — Telephone Encounter (Signed)
Pt made aware rx & controlled substance contract is at front desk ready to be picked up.

## 2012-09-13 DIAGNOSIS — K219 Gastro-esophageal reflux disease without esophagitis: Secondary | ICD-10-CM

## 2012-09-13 HISTORY — DX: Gastro-esophageal reflux disease without esophagitis: K21.9

## 2012-09-23 ENCOUNTER — Encounter: Payer: Self-pay | Admitting: Internal Medicine

## 2012-10-14 ENCOUNTER — Other Ambulatory Visit: Payer: Self-pay | Admitting: Internal Medicine

## 2012-10-14 ENCOUNTER — Encounter: Payer: Self-pay | Admitting: *Deleted

## 2012-10-14 NOTE — Telephone Encounter (Signed)
Letter mailed to pt to make aware she is due for OV.  Refill done.

## 2012-11-06 ENCOUNTER — Encounter: Payer: Self-pay | Admitting: Internal Medicine

## 2012-11-06 ENCOUNTER — Ambulatory Visit (INDEPENDENT_AMBULATORY_CARE_PROVIDER_SITE_OTHER): Payer: Commercial Managed Care - PPO | Admitting: Internal Medicine

## 2012-11-06 VITALS — BP 130/90 | HR 71 | Temp 98.0°F | Wt 196.4 lb

## 2012-11-06 DIAGNOSIS — F419 Anxiety disorder, unspecified: Secondary | ICD-10-CM

## 2012-11-06 DIAGNOSIS — F32A Depression, unspecified: Secondary | ICD-10-CM

## 2012-11-06 DIAGNOSIS — K219 Gastro-esophageal reflux disease without esophagitis: Secondary | ICD-10-CM

## 2012-11-06 DIAGNOSIS — F341 Dysthymic disorder: Secondary | ICD-10-CM

## 2012-11-06 MED ORDER — BUPROPION HCL ER (XL) 150 MG PO TB24: ORAL_TABLET | ORAL | Status: DC

## 2012-11-06 MED ORDER — FLUOXETINE HCL 20 MG PO TABS: ORAL_TABLET | ORAL | Status: DC

## 2012-11-06 NOTE — Assessment & Plan Note (Signed)
Improve with the addition of fluoxetine few months ago.We talk about possibly increasing the SSRIs dose but she does not feel that is necessary at this point. Doing counseling, encouraged to continue with that. Refill Wellbutrin and fluoxetine. Will call for Xanax RF when necessary. States she gave a urine sample for a UDS few months ago, I don't see any results, will ask her to sign a contract and provide another urine sample today.

## 2012-11-06 NOTE — Progress Notes (Signed)
  Subjective:    Patient ID: Jessica Huang, female    DOB: 1955-11-16, 57 y.o.   MRN: [id number]  HPI Routine office visit Anxiety and depression, started Prozac several months ago, initially took only half tablet then one tablet daily. Symptoms have definitely improved, see assessment and plan. She takes Xanax, needs a UDS. Has ultram at home, a leftover, has not taken it recently.  Past Medical History  Diagnosis Date  . Anxiety and depression   . Varicose veins   . Osteopenia 2004    Dexa, hip  . GERD (gastroesophageal reflux disease) 09-2012    dx by ENT    Past Surgical History  Procedure Laterality Date  . Appendectomy    . Tonsillectomy    . Varicose vein surgery  2014    laser    History   Social History  . Marital Status: Divorced    Spouse Name: N/A    Number of Children: 2  . Years of Education: N/A   Occupational History  . Private school teacher, 7th grade    Social History Main Topics  . Smoking status: Never Smoker   . Smokeless tobacco: Never Used  . Alcohol Use: Yes     Comment: rarely   . Drug Use: No  . Sexually Active: Not on file   Other Topics Concern  . Not on file   Social History Narrative   divorce, 1 her children lives at home    Review of Systems In general feels well. Sleeping better. Need to take Xanax in the morning has definitely decreased. Denies suicidal ideas History of "allergies",  failed to improve with standard treatment and eventually saw ENT, was diagnosed with GERD. On Prilosec. Sx improving     Objective:   Physical Exam BP 130/90  Pulse 71  Temp(Src) 98 F (36.7 C) (Oral)  Wt 196 lb 6.4 oz (89.086 kg)  BMI 32.17 kg/m2  SpO2 95%  General -- alert, well-developed, NAD .    Lungs -- normal respiratory effort, no intercostal retractions, no accessory muscle use, and normal breath sounds.   Heart-- normal rate, regular rhythm, no murmur, and no gallop.    Psych-- Cognition and judgment appear intact. Alert and  cooperative with normal attention span and concentration.  not anxious appearing and not depressed appearing.      Assessment & Plan:

## 2012-11-06 NOTE — Assessment & Plan Note (Signed)
Present diagnosis of atypical GERD by ENT.

## 2012-11-06 NOTE — Patient Instructions (Addendum)
Next visit by November 2014, fasting for a physical exam

## 2012-11-08 ENCOUNTER — Encounter: Payer: Self-pay | Admitting: Internal Medicine

## 2012-11-11 ENCOUNTER — Telehealth: Payer: Self-pay | Admitting: Internal Medicine

## 2012-11-11 NOTE — Telephone Encounter (Signed)
Chart updated with this information.  

## 2012-11-11 NOTE — Telephone Encounter (Addendum)
UDS 06-2012  showed "Darvocet". UDS 11-06-12 +4 hydrocodone which is not on hee medication list. No darvocet Plan: Moderate risk

## 2012-11-20 ENCOUNTER — Encounter: Payer: Self-pay | Admitting: Internal Medicine

## 2013-02-16 ENCOUNTER — Telehealth: Payer: Self-pay | Admitting: *Deleted

## 2013-02-16 MED ORDER — ALPRAZOLAM 0.5 MG PO TABS: ORAL_TABLET | ORAL | Status: DC

## 2013-02-16 NOTE — Telephone Encounter (Signed)
rx refill- Xanax 0.5mg   Last ov- 11/06/12 Last refilled- 07/04/12 #90 / 3 rf  Future ov- 03/08/13  UDS moderate - 11/06/12  Please advise. DJR

## 2013-02-16 NOTE — Telephone Encounter (Signed)
Pt notified. DJR  

## 2013-02-16 NOTE — Telephone Encounter (Signed)
Ok RF, needs UDS

## 2013-02-16 NOTE — Addendum Note (Signed)
Addended by: Willow Ora E on: 02/16/2013 10:34 AM   Modules accepted: Orders

## 2013-03-04 ENCOUNTER — Telehealth: Payer: Self-pay

## 2013-03-04 NOTE — Telephone Encounter (Signed)
Left message for call back  identifiable     

## 2013-03-05 NOTE — Telephone Encounter (Addendum)
Medication and allergies: reviewed and updated  90 day supply/mail order: na Local pharmacy: [facility] [address] [place]   Immunizations due:  UTD  A/P:   No changes to FH or PSH Flu vaccine at work Health Maintenance  Topic Date Due  . Pap Smear  06/24/2013  . Influenza Vaccine  11/13/2013  . Mammogram  09/16/2014  . Colonoscopy  05/04/2017  . Tetanus/tdap  11/29/2018    To Discuss with Provider: Prilosec

## 2013-03-08 ENCOUNTER — Encounter: Payer: Self-pay | Admitting: Internal Medicine

## 2013-03-08 ENCOUNTER — Ambulatory Visit (INDEPENDENT_AMBULATORY_CARE_PROVIDER_SITE_OTHER): Payer: Commercial Managed Care - PPO | Admitting: Internal Medicine

## 2013-03-08 VITALS — BP 131/82 | HR 77 | Temp 98.0°F | Ht 65.2 in | Wt 201.0 lb

## 2013-03-08 DIAGNOSIS — F32A Depression, unspecified: Secondary | ICD-10-CM

## 2013-03-08 DIAGNOSIS — K219 Gastro-esophageal reflux disease without esophagitis: Secondary | ICD-10-CM

## 2013-03-08 DIAGNOSIS — F329 Major depressive disorder, single episode, unspecified: Secondary | ICD-10-CM

## 2013-03-08 DIAGNOSIS — Z0000 Encounter for general adult medical examination without abnormal findings: Secondary | ICD-10-CM

## 2013-03-08 LAB — CBC WITH DIFFERENTIAL/PLATELET
Basophils Absolute: 0 10*3/uL (ref 0.0–0.1)
Eosinophils Absolute: 0.2 10*3/uL (ref 0.0–0.7)
HCT: 44 % (ref 36.0–46.0)
Lymphs Abs: 1.6 10*3/uL (ref 0.7–4.0)
MCHC: 34.3 g/dL (ref 30.0–36.0)
MCV: 89.3 fl (ref 78.0–100.0)
Monocytes Absolute: 0.4 10*3/uL (ref 0.1–1.0)
Platelets: 256 10*3/uL (ref 150.0–400.0)
RDW: 13.1 % (ref 11.5–14.6)

## 2013-03-08 LAB — LIPID PANEL
Cholesterol: 187 mg/dL (ref 0–200)
LDL Cholesterol: 116 mg/dL — ABNORMAL HIGH (ref 0–99)

## 2013-03-08 LAB — COMPREHENSIVE METABOLIC PANEL
ALT: 10 U/L (ref 0–35)
Albumin: 3.8 g/dL (ref 3.5–5.2)
Alkaline Phosphatase: 103 U/L (ref 39–117)
Potassium: 4.3 mEq/L (ref 3.5–5.1)
Sodium: 139 mEq/L (ref 135–145)
Total Bilirubin: 0.4 mg/dL (ref 0.3–1.2)
Total Protein: 7.4 g/dL (ref 6.0–8.3)

## 2013-03-08 MED ORDER — FLUTICASONE PROPIONATE 50 MCG/ACT NA SUSP: NASAL | Status: DC

## 2013-03-08 MED ORDER — TRAMADOL HCL 50 MG PO TABS: 50.0000 mg | ORAL_TABLET | Freq: Three times a day (TID) | ORAL | Status: DC | PRN

## 2013-03-08 NOTE — Assessment & Plan Note (Addendum)
Sx are  mostly throat phlegm-irritation in AM, had two laryngoscopes, has some irritation at the [facility], after last scope was red to increase PPIs to bid by ENT, still has sx. Plan: Cont w/ PPIs Prilosec 40 mg BID but to be taken always on a empty stomach  GERD precautions Cont nasal steroids  (could sx be multifactorial?) If  no better, ?GI

## 2013-03-08 NOTE — Assessment & Plan Note (Addendum)
Td 2010 Had a flu shot  Shingles immunization not covered by insurance  Cscope neg 1-09 labs  Female care: sees gynecology , up date on pap, mmg  Patient is counseled about diet-exercise

## 2013-03-08 NOTE — Patient Instructions (Signed)
Get your blood work before you leave  Next visit in 6 months for a   follow up . No Fasting Please make an appointment    

## 2013-03-08 NOTE — Assessment & Plan Note (Addendum)
Excellent control, PHQ 9 zero Xanax prn at night, taking less than before  RTC 6 months, UDS then

## 2013-03-08 NOTE — Progress Notes (Signed)
  Subjective:    Patient ID: Jessica Huang, female    DOB: 1955-10-11, 57 y.o.   MRN: [id number]  HPI CPX  Past Medical History  Diagnosis Date  . Anxiety and depression   . Varicose veins   . Osteopenia 2004    Dexa, hip  . GERD (gastroesophageal reflux disease) 09-2012    dx by ENT    Past Surgical History  Procedure Laterality Date  . Appendectomy    . Tonsillectomy    . Varicose vein surgery  2014    laser    History   Social History  . Marital Status: Divorced    Spouse Name: N/A    Number of Children: 2  . Years of Education: N/A   Occupational History  . Private school teacher, 7th grade    Social History Main Topics  . Smoking status: Never Smoker   . Smokeless tobacco: Never Used  . Alcohol Use: Yes     Comment: rarely   . Drug Use: No  . Sexual Activity: Not on file   Other Topics Concern  . Not on file   Social History Narrative   divorce, 2 children lives at home    Family History  Problem Relation Age of Onset  . Other Mother     valve replacement--M  . Breast cancer Mother     M late 40s  . COPD Father     smoker  . Heart attack Neg Hx   . Colon cancer Neg Hx   . Heart failure Mother     M  . Diabetes Other     GPs , late onset    Review of Systems Diet-- regular to healthy Exercise-- needs more exercise  No  CP, SOB, lower extremity edema Denies  nausea, vomiting diarrhea Denies  blood in the stools See GERD (-) cough, (-) wheezing, chest congestion No dysuria, gross hematuria, difficulty urinating       Objective:   Physical Exam  BP 131/82  Pulse 77  Temp(Src) 98 F (36.7 C)  Ht 5' 5.2" (1.656 m)  Wt 201 lb (91.173 kg)  BMI 33.25 kg/m2  SpO2 96% General -- alert, well-developed, NAD.  Neck --no thyromegaly , normal carotid pulse Lungs -- normal respiratory effort, no intercostal retractions, no accessory muscle use, and normal breath sounds.  Heart-- normal rate, regular rhythm, no murmur.  Abdomen-- Not  distended, good bowel sounds,soft, non-tender. Extremities-- no pretibial edema bilaterally  Neurologic--  alert & oriented X3. Speech normal, gait normal, strength normal in all extremities.  Psych-- Cognition and judgment appear intact. Cooperative with normal attention span and concentration. No anxious appearing , no depressed appearing.     Assessment & Plan:

## 2013-03-08 NOTE — Progress Notes (Signed)
Pre visit review using our clinic review tool, if applicable. No additional management support is needed unless otherwise documented below in the visit note. 

## 2013-03-08 NOTE — Assessment & Plan Note (Signed)
occ ultram

## 2013-03-12 ENCOUNTER — Encounter: Payer: Self-pay | Admitting: *Deleted

## 2013-04-12 ENCOUNTER — Ambulatory Visit (INDEPENDENT_AMBULATORY_CARE_PROVIDER_SITE_OTHER): Payer: Commercial Managed Care - PPO | Admitting: Family Medicine

## 2013-04-12 ENCOUNTER — Encounter: Payer: Self-pay | Admitting: Family Medicine

## 2013-04-12 VITALS — BP 126/80 | HR 77 | Temp 98.1°F | Resp 16 | Wt 202.1 lb

## 2013-04-12 DIAGNOSIS — J0100 Acute maxillary sinusitis, unspecified: Secondary | ICD-10-CM

## 2013-04-12 MED ORDER — PROMETHAZINE-DM 6.25-15 MG/5ML PO SYRP: 5.0000 mL | ORAL_SOLUTION | Freq: Four times a day (QID) | ORAL | Status: DC | PRN

## 2013-04-12 MED ORDER — CLARITHROMYCIN 500 MG PO TABS: 500.0000 mg | ORAL_TABLET | Freq: Two times a day (BID) | ORAL | Status: DC

## 2013-04-12 NOTE — Progress Notes (Signed)
Pre visit review using our clinic review tool, if applicable. No additional management support is needed unless otherwise documented below in the visit note. 

## 2013-04-12 NOTE — Progress Notes (Signed)
   Subjective:    Patient ID: Jessica Huang, female    DOB: [birth year], 57 y.o.   MRN: [id number]  HPI URI- sxs started 2 weeks ago w/ nasal congestion, cough.  Started Sudafed w/out relief.  + sick contacts.  No fevers.  + sinus pain.  Bilateral ear fullness.  No sore throat.  Cough is productive.  Coughing fit to point of near vomiting.  Worsening rather than improving.   Review of Systems For ROS see HPI     Objective:   Physical Exam  Vitals reviewed. Constitutional: She appears well-developed and well-nourished. No distress.  HENT:  Head: Normocephalic and atraumatic.  Right Ear: Tympanic membrane normal.  Left Ear: Tympanic membrane normal.  Nose: Mucosal edema and rhinorrhea present. Right sinus exhibits maxillary sinus tenderness and frontal sinus tenderness. Left sinus exhibits maxillary sinus tenderness and frontal sinus tenderness.  Mouth/Throat: Uvula is midline and mucous membranes are normal. Posterior oropharyngeal erythema present. No oropharyngeal exudate.  Eyes: Conjunctivae and EOM are normal. Pupils are equal, round, and reactive to light.  Neck: Normal range of motion. Neck supple.  Cardiovascular: Normal rate, regular rhythm and normal heart sounds.   Pulmonary/Chest: Effort normal and breath sounds normal. No respiratory distress. She has no wheezes.  Lymphadenopathy:    She has no cervical adenopathy.          Assessment & Plan:

## 2013-04-12 NOTE — Assessment & Plan Note (Signed)
Pt's sxs and PE consistent w/ infxn.  Start abx.  Cough meds prn.  Reviewed supportive care and red flags that should prompt return.  Pt expressed understanding and is in agreement w/ plan.  

## 2013-04-12 NOTE — Patient Instructions (Signed)
Follow up as needed Start the Biaxin twice daily- take w/ food Drink plenty of fluids REST! Use the cough syrup as needed- will cause drowsiness Use Mucinex DM for daytime cough Call with any questions or concerns Hang in there! Happy New Year!

## 2013-06-07 ENCOUNTER — Telehealth: Payer: Self-pay | Admitting: Internal Medicine

## 2013-06-07 MED ORDER — FLUOXETINE HCL 20 MG PO TABS: ORAL_TABLET | ORAL | Status: DC

## 2013-06-07 NOTE — Telephone Encounter (Signed)
rx sent per protocol. 

## 2013-06-07 NOTE — Telephone Encounter (Signed)
Patient's daughter called to request rx for fluoxetine 20 mg tab. Pt uses [facility] in [place]

## 2013-06-17 ENCOUNTER — Other Ambulatory Visit: Payer: Self-pay | Admitting: Internal Medicine

## 2013-06-28 ENCOUNTER — Encounter: Payer: Self-pay | Admitting: Physician Assistant

## 2013-06-28 ENCOUNTER — Ambulatory Visit (INDEPENDENT_AMBULATORY_CARE_PROVIDER_SITE_OTHER): Payer: Commercial Managed Care - PPO | Admitting: Physician Assistant

## 2013-06-28 VITALS — BP 153/85 | HR 83 | Temp 98.3°F | Resp 16 | Ht 65.5 in | Wt 208.2 lb

## 2013-06-28 DIAGNOSIS — L089 Local infection of the skin and subcutaneous tissue, unspecified: Secondary | ICD-10-CM | POA: Insufficient documentation

## 2013-06-28 DIAGNOSIS — T148XXA Other injury of unspecified body region, initial encounter: Principal | ICD-10-CM

## 2013-06-28 MED ORDER — SULFAMETHOXAZOLE-TMP DS 800-160 MG PO TABS: 1.0000 | ORAL_TABLET | Freq: Two times a day (BID) | ORAL | Status: DC

## 2013-06-28 NOTE — Patient Instructions (Signed)
Please take antibiotic as prescribed.  Keep area clean and dry.  Apply topical Neosporin. I am sending fluid off for culture.  Follow-up with Dr. Larose Kells on Friday.  If area worsens while on antibiotic, or if you develop fever/chills, please proceed directly to the ER.

## 2013-06-28 NOTE — Progress Notes (Signed)
Pre visit review using our clinic review tool, if applicable. No additional management support is needed unless otherwise documented below in the visit note/SLS  

## 2013-06-28 NOTE — Progress Notes (Signed)
Patient presents to clinic today c/o pain, swelling and redness of right middle phalanx.  Patient endorses cutting her finger on sharp glass around 2 weeks ago.  Did not have redness or swelling until about 1 week ago.  Endorses mild tenderness and decreased ROM.  Has noted some initial drainage from region that has subsided.  Denies fever, chills, sweats.  Past Medical History  Diagnosis Date  . Anxiety and depression   . Varicose veins   . Osteopenia 2004    Dexa, hip  . GERD (gastroesophageal reflux disease) 09-2012    dx by ENT     Current Outpatient Prescriptions on File Prior to Visit  Medication Sig Dispense Refill  . ALPRAZolam (XANAX) 0.5 MG tablet TAKE ONE AND ONE-HALF TABLETS BY MOUTH EVERY DAY IN THE MORNING AND  AT  BEDTIME  AS  NEEDED  90 tablet  1  . buPROPion (WELLBUTRIN XL) 150 MG 24 hr tablet TAKE TWO TABLETS BY MOUTH IN THE MORNING  60 tablet  5  . CALCIUM PO Take by mouth.        . cetirizine (ZYRTEC) 10 MG tablet Take 10 mg by mouth daily.      . cholecalciferol (VITAMIN D) 1000 UNITS tablet Take 1,000 Units by mouth 2 (two) times daily.        Marland Kitchen FLUoxetine (PROZAC) 20 MG tablet TAKE ONE TABLET BY MOUTH EVERY DAY  30 tablet  6  . fluticasone (FLONASE) 50 MCG/ACT nasal spray 2 sprays into nose daily.  16 g  6  . omeprazole (PRILOSEC) 40 MG capsule Take 40 mg by mouth 2 (two) times daily.       . traMADol (ULTRAM) 50 MG tablet Take 1 tablet (50 mg total) by mouth every 8 (eight) hours as needed.  90 tablet  0   No current facility-administered medications on file prior to visit.    Allergies  Allergen Reactions  . Penicillins     Family History  Problem Relation Age of Onset  . Other Mother     valve replacement--M  . Breast cancer Mother     M late 44s  . COPD Father     smoker  . Heart attack Neg Hx   . Colon cancer Neg Hx   . Heart failure Mother     M  . Diabetes Other     GPs , late onset    History   Social History  . Marital Status: Divorced     Spouse Name: N/A    Number of Children: 2  . Years of Education: N/A   Occupational History  . Private school teacher, 7th grade    Social History Main Topics  . Smoking status: Never Smoker   . Smokeless tobacco: Never Used  . Alcohol Use: Yes     Comment: rarely   . Drug Use: No  . Sexual Activity: None   Other Topics Concern  . None   Social History Narrative   divorce, 2 children lives at home    Review of Systems - See HPI.  All other ROS are negative.  BP 153/85  Pulse 83  Temp(Src) 98.3 F (36.8 C) (Oral)  Resp 16  Ht 5' 5.5" (1.664 m)  Wt 208 lb 4 oz (94.462 kg)  BMI 34.12 kg/m2  SpO2 97%  Physical Exam  Vitals reviewed. Constitutional: She is oriented to person, place, and time and well-developed, well-nourished, and in no distress.  HENT:  Head: Normocephalic.  Eyes:  Conjunctivae are normal. Pupils are equal, round, and reactive to light.  Neck: Neck supple.  Cardiovascular: Normal rate, regular rhythm and normal heart sounds.   Pulmonary/Chest: Effort normal and breath sounds normal. No respiratory distress. She has no wheezes. She has no rales. She exhibits no tenderness.  Neurological: She is oriented to person, place, and time.  Skin: Skin is warm and dry.  Swelling and erythema noted around PIP joint of 3rd R Phalanx.  Serous fluid noted.  Culture obtained.  Psychiatric: Affect normal.   Assessment/Plan: Wound infection Specimen obtained.  Sent for culture.  Td UTD.  Rx Bactrim.  Discussed wound care.  Follow-up in 3-4 days.  If symptoms worsen while on antibiotic, patient to proceed to ER.

## 2013-06-28 NOTE — Assessment & Plan Note (Signed)
Specimen obtained.  Sent for culture.  Td UTD.  Rx Bactrim.  Discussed wound care.  Follow-up in 3-4 days.  If symptoms worsen while on antibiotic, patient to proceed to ER.

## 2013-07-01 LAB — WOUND CULTURE
Gram Stain: NONE SEEN
Organism ID, Bacteria: NO GROWTH

## 2013-07-05 ENCOUNTER — Ambulatory Visit: Payer: Commercial Managed Care - PPO | Admitting: Family Medicine

## 2013-11-01 ENCOUNTER — Encounter: Payer: Self-pay | Admitting: Internal Medicine

## 2013-11-01 ENCOUNTER — Ambulatory Visit (INDEPENDENT_AMBULATORY_CARE_PROVIDER_SITE_OTHER): Payer: Commercial Managed Care - PPO | Admitting: Internal Medicine

## 2013-11-01 VITALS — BP 155/93 | HR 71 | Temp 98.2°F | Wt 215.0 lb

## 2013-11-01 DIAGNOSIS — S42309S Unspecified fracture of shaft of humerus, unspecified arm, sequela: Secondary | ICD-10-CM

## 2013-11-01 DIAGNOSIS — F341 Dysthymic disorder: Secondary | ICD-10-CM

## 2013-11-01 DIAGNOSIS — F32A Depression, unspecified: Secondary | ICD-10-CM

## 2013-11-01 DIAGNOSIS — F419 Anxiety disorder, unspecified: Secondary | ICD-10-CM

## 2013-11-01 DIAGNOSIS — S42301S Unspecified fracture of shaft of humerus, right arm, sequela: Secondary | ICD-10-CM

## 2013-11-01 DIAGNOSIS — F329 Major depressive disorder, single episode, unspecified: Secondary | ICD-10-CM

## 2013-11-01 MED ORDER — OXYCODONE-ACETAMINOPHEN 5-325 MG PO TABS: 1.0000 | ORAL_TABLET | Freq: Three times a day (TID) | ORAL | Status: DC | PRN

## 2013-11-01 MED ORDER — TRAMADOL HCL 50 MG PO TABS: 50.0000 mg | ORAL_TABLET | Freq: Three times a day (TID) | ORAL | Status: DC | PRN

## 2013-11-01 NOTE — Patient Instructions (Addendum)
Please go to the lab for a UDS Next visit by 12-2013

## 2013-11-01 NOTE — Assessment & Plan Note (Signed)
Controlled.  

## 2013-11-01 NOTE — Progress Notes (Signed)
Subjective:    Patient ID: Jessica Huang, female    DOB: 1955-06-18, 58 y.o.   MRN: [id number]  DOS:  11/01/2013 Type of visit - description: acute History: Here with her daughter, 2 weeks ago had a fall at home, developed a comminuted fracture of the humerus, right side. In a lot of pain, The ER doctor prescribed Percocet which helped. Saw the orthopedic doctor today, he refilled  tramadol which is not helping. Other issues are stable, good medication compliance   ROS Denies nausea, vomiting, diarrhea. Mild constipation, thinks related to pain medication  Past Medical History  Diagnosis Date  . Anxiety and depression   . Varicose veins   . Osteopenia 2004    Dexa, hip  . GERD (gastroesophageal reflux disease) 09-2012    dx by ENT     Past Surgical History  Procedure Laterality Date  . Appendectomy    . Tonsillectomy    . Varicose vein surgery  2014    laser     History   Social History  . Marital Status: Divorced    Spouse Name: N/A    Number of Children: 2  . Years of Education: N/A   Occupational History  . Private school teacher, 7th grade    Social History Main Topics  . Smoking status: Never Smoker   . Smokeless tobacco: Never Used  . Alcohol Use: Yes     Comment: rarely   . Drug Use: No  . Sexual Activity: Not on file   Other Topics Concern  . Not on file   Social History Narrative   divorce, 2 children lives at home         Medication List       This list is accurate as of: 11/01/13  5:51 PM.  Always use your most recent med list.               ALPRAZolam 0.5 MG tablet  Commonly known as:  XANAX  TAKE ONE AND ONE-HALF TABLETS BY MOUTH EVERY DAY IN THE MORNING AND  AT  BEDTIME  AS  NEEDED     buPROPion 150 MG 24 hr tablet  Commonly known as:  WELLBUTRIN XL  TAKE TWO TABLETS BY MOUTH IN THE MORNING     CALCIUM PO  Take by mouth.     cetirizine 10 MG tablet  Commonly known as:  ZYRTEC  Take 10 mg by mouth daily.     cholecalciferol 1000 UNITS tablet  Commonly known as:  VITAMIN D  Take 1,000 Units by mouth 2 (two) times daily.     FLUoxetine 20 MG tablet  Commonly known as:  PROZAC  TAKE ONE TABLET BY MOUTH EVERY DAY     fluticasone 50 MCG/ACT nasal spray  Commonly known as:  FLONASE  2 sprays into nose daily.     oxyCODONE-acetaminophen 5-325 MG per tablet  Commonly known as:  ROXICET  Take 1 tablet by mouth every 8 (eight) hours as needed for severe pain.     traMADol 50 MG tablet  Commonly known as:  ULTRAM  Take 1 tablet (50 mg total) by mouth every 8 (eight) hours as needed.           Objective:   Physical Exam BP 155/93  Pulse 71  Temp(Src) 98.2 F (36.8 C)  Wt 215 lb (97.523 kg)  SpO2 97% General -- alert, well-developed, NAD.   Lungs -- normal respiratory effort, no intercostal retractions, no accessory muscle  use, and normal breath sounds.  Heart-- normal rate, regular rhythm, no murmur.   Extremities-- no pretibial edema bilaterally ; arm in a sling on the R Neurologic--  alert & oriented X3.   Psych-- Cognition and judgment appear intact. Cooperative with normal attention span and concentration. No anxious or depressed appearing.        Assessment & Plan:

## 2013-11-01 NOTE — Progress Notes (Signed)
Pre visit review using our clinic review tool, if applicable. No additional management support is needed unless otherwise documented below in the visit note. 

## 2013-11-01 NOTE — Assessment & Plan Note (Addendum)
Arm fracture as described in the HPI. Took Percocet from the ER it worked well, she ran out and now she is taking tramadol (which she  Uses prn )  but is not helping enough. Plan: Will refill Percocet to use temporarily  Also, RF ultram for prn use . She knows not to take both. Will check a UDS noting  that she also takes Xanax and  Used a old darvocet left over

## 2013-11-15 ENCOUNTER — Encounter (HOSPITAL_COMMUNITY): Payer: Self-pay | Admitting: Pharmacy Technician

## 2013-11-16 ENCOUNTER — Encounter (HOSPITAL_COMMUNITY)
Admission: RE | Admit: 2013-11-16 | Discharge: 2013-11-16 | Disposition: A | Discharge status: Home / Self Care | Payer: Commercial Managed Care - PPO | Source: Ambulatory Visit | Attending: Orthopedic Surgery | Admitting: Orthopedic Surgery

## 2013-11-16 ENCOUNTER — Encounter (HOSPITAL_COMMUNITY): Payer: Self-pay

## 2013-11-16 DIAGNOSIS — F411 Generalized anxiety disorder: Secondary | ICD-10-CM | POA: Diagnosis not present

## 2013-11-16 DIAGNOSIS — X58XXXA Exposure to other specified factors, initial encounter: Secondary | ICD-10-CM | POA: Diagnosis not present

## 2013-11-16 DIAGNOSIS — Y929 Unspecified place or not applicable: Secondary | ICD-10-CM | POA: Diagnosis not present

## 2013-11-16 DIAGNOSIS — I8390 Asymptomatic varicose veins of unspecified lower extremity: Secondary | ICD-10-CM | POA: Diagnosis not present

## 2013-11-16 DIAGNOSIS — K219 Gastro-esophageal reflux disease without esophagitis: Secondary | ICD-10-CM | POA: Diagnosis not present

## 2013-11-16 DIAGNOSIS — F329 Major depressive disorder, single episode, unspecified: Secondary | ICD-10-CM | POA: Diagnosis not present

## 2013-11-16 DIAGNOSIS — S42253A Displaced fracture of greater tuberosity of unspecified humerus, initial encounter for closed fracture: Secondary | ICD-10-CM | POA: Diagnosis not present

## 2013-11-16 DIAGNOSIS — Z79899 Other long term (current) drug therapy: Secondary | ICD-10-CM | POA: Diagnosis not present

## 2013-11-16 DIAGNOSIS — F3289 Other specified depressive episodes: Secondary | ICD-10-CM | POA: Diagnosis not present

## 2013-11-16 HISTORY — DX: Headache: R51

## 2013-11-16 HISTORY — DX: Dorsalgia, unspecified: M54.9

## 2013-11-16 LAB — COMPREHENSIVE METABOLIC PANEL
ALBUMIN: 3.3 g/dL — AB (ref 3.5–5.2)
ALT: 18 U/L (ref 0–35)
ANION GAP: 15 (ref 5–15)
AST: 30 U/L (ref 0–37)
Alkaline Phosphatase: 134 U/L — ABNORMAL HIGH (ref 39–117)
BUN: 11 mg/dL (ref 6–23)
CHLORIDE: 104 meq/L (ref 96–112)
CO2: 21 mEq/L (ref 19–32)
Calcium: 8.8 mg/dL (ref 8.4–10.5)
Creatinine, Ser: 0.86 mg/dL (ref 0.50–1.10)
GFR calc non Af Amer: 73 mL/min — ABNORMAL LOW (ref >90)
GFR, EST AFRICAN AMERICAN: 85 mL/min — AB (ref >90)
Glucose, Bld: 88 mg/dL (ref 70–99)
POTASSIUM: 4.5 meq/L (ref 3.7–5.3)
Sodium: 140 mEq/L (ref 137–147)
TOTAL PROTEIN: 6.9 g/dL (ref 6.0–8.3)
Total Bilirubin: 0.3 mg/dL (ref 0.3–1.2)

## 2013-11-16 LAB — CBC WITH DIFFERENTIAL/PLATELET
BASOS ABS: 0 10*3/uL (ref 0.0–0.1)
Basophils Relative: 1 % (ref 0–1)
Eosinophils Absolute: 0.2 10*3/uL (ref 0.0–0.7)
Eosinophils Relative: 3 % (ref 0–5)
HEMATOCRIT: 42.4 % (ref 36.0–46.0)
HEMOGLOBIN: 14.3 g/dL (ref 12.0–15.0)
Lymphocytes Relative: 30 % (ref 12–46)
Lymphs Abs: 2.1 10*3/uL (ref 0.7–4.0)
MCH: 30.6 pg (ref 26.0–34.0)
MCHC: 33.7 g/dL (ref 30.0–36.0)
MCV: 90.6 fL (ref 78.0–100.0)
MONO ABS: 0.5 10*3/uL (ref 0.1–1.0)
Monocytes Relative: 7 % (ref 3–12)
NEUTROS ABS: 4.1 10*3/uL (ref 1.7–7.7)
Neutrophils Relative %: 59 % (ref 43–77)
Platelets: 234 10*3/uL (ref 150–400)
RBC: 4.68 MIL/uL (ref 3.87–5.11)
RDW: 12.7 % (ref 11.5–15.5)
WBC: 7 10*3/uL (ref 4.0–10.5)

## 2013-11-16 LAB — PROTIME-INR
INR: 1.03 (ref 0.00–1.49)
PROTHROMBIN TIME: 13.5 s (ref 11.6–15.2)

## 2013-11-16 LAB — APTT: APTT: 28 s (ref 24–37)

## 2013-11-16 MED ORDER — CHLORHEXIDINE GLUCONATE 4 % EX LIQD: 60.0000 mL | Freq: Once | CUTANEOUS | Status: DC

## 2013-11-16 NOTE — Progress Notes (Signed)
Pt doesn't have a cardiologist  Denies ever having an echo/stress test/heart cath  MEdical Md is Dr.Paz  Denies EKG or CXR in past yr

## 2013-11-16 NOTE — Pre-Procedure Instructions (Signed)
Jessica Huang  11/16/2013   Your procedure is scheduled on:  Thurs, Aug 6 @ 12:35 PM  Report to [facility]  at 10:30 AM.  Call this number if you have problems the morning of surgery: [phone]   Remember:   Do not eat food or drink liquids after midnight.   Take these medicines the morning of surgery with A SIP OF WATER: Alprazolam(Xanax),Wellbutrin(Bupropion),Valium(Diazepam),Prozac(Fluoxetine),and Pain Pill(if needed)               No Goody's,BC's,Aleve,Aspirin,Ibuprofen,Fish Oil,or any Herbal Medications   Do not wear jewelry, make-up or nail polish.  Do not wear lotions, powders, or perfumes. You may wear deodorant.  Do not shave 48 hours prior to surgery.   Do not bring valuables to the hospital.  [facility] is not responsible                  for any belongings or valuables.               Contacts, dentures or bridgework may not be worn into surgery.  Leave suitcase in the car. After surgery it may be brought to your room.  For patients admitted to the hospital, discharge time is determined by your                treatment team.               Patients discharged the day of surgery will not be allowed to drive  home.    Special Instructions:  [facility] - Preparing for Surgery  Before surgery, you can play an important role.  Because skin is not sterile, your skin needs to be as free of germs as possible.  You can reduce the number of germs on you skin by washing with CHG (chlorahexidine gluconate) soap before surgery.  CHG is an antiseptic cleaner which kills germs and bonds with the skin to continue killing germs even after washing.  Please DO NOT use if you have an allergy to CHG or antibacterial soaps.  If your skin becomes reddened/irritated stop using the CHG and inform your nurse when you arrive at Short Stay.  Do not shave (including legs and underarms) for at least 48 hours prior to the first CHG shower.  You may shave your face.  Please follow these  instructions carefully:   1.  Shower with CHG Soap the night before surgery and the                                morning of Surgery.  2.  If you choose to wash your hair, wash your hair first as usual with your       normal shampoo.  3.  After you shampoo, rinse your hair and body thoroughly to remove the                      Shampoo.  4.  Use CHG as you would any other liquid soap.  You can apply chg directly       to the skin and wash gently with scrungie or a clean washcloth.  5.  Apply the CHG Soap to your body ONLY FROM THE NECK DOWN.        Do not use on open wounds or open sores.  Avoid contact with your eyes,       ears, mouth and genitals (  private parts).  Wash genitals (private parts)       with your normal soap.  6.  Wash thoroughly, paying special attention to the area where your surgery        will be performed.  7.  Thoroughly rinse your body with warm water from the neck down.  8.  DO NOT shower/wash with your normal soap after using and rinsing off       the CHG Soap.  9.  Pat yourself dry with a clean towel.            10.  Wear clean pajamas.            11.  Place clean sheets on your bed the night of your first shower and do not        sleep with pets.  Day of Surgery  Do not apply any lotions/deoderants the morning of surgery.  Please wear clean clothes to the hospital/surgery center.     Please read over the following fact sheets that you were given: Pain Booklet, Coughing and Deep Breathing and Surgical Site Infection Prevention

## 2013-11-17 ENCOUNTER — Telehealth: Payer: Self-pay

## 2013-11-17 MED ORDER — CEFAZOLIN SODIUM-DEXTROSE 2-3 GM-% IV SOLR
2.0000 g | INTRAVENOUS | Status: AC
  Administered 2013-11-18: 2 g via INTRAVENOUS
  Filled 2013-11-17: qty 50

## 2013-11-17 MED ORDER — LACTATED RINGERS IV SOLN: INTRAVENOUS | Status: DC

## 2013-11-17 NOTE — Telephone Encounter (Signed)
UDS: 11/02/2013 Negative for oxycodone--not taking at time of test Positive for Tramadol Negative for Xanax--PRN Low Risk per Dr Larose Kells

## 2013-11-18 ENCOUNTER — Ambulatory Visit (HOSPITAL_COMMUNITY)
Admission: RE | Admit: 2013-11-18 | Discharge: 2013-11-18 | Disposition: A | Discharge status: Home / Self Care | Payer: Commercial Managed Care - PPO | Source: Ambulatory Visit | Attending: Orthopedic Surgery | Admitting: Orthopedic Surgery

## 2013-11-18 ENCOUNTER — Encounter (HOSPITAL_COMMUNITY): Payer: Self-pay | Admitting: *Deleted

## 2013-11-18 ENCOUNTER — Ambulatory Visit (HOSPITAL_COMMUNITY): Payer: Commercial Managed Care - PPO | Admitting: Certified Registered Nurse Anesthetist

## 2013-11-18 ENCOUNTER — Encounter (HOSPITAL_COMMUNITY): Payer: Commercial Managed Care - PPO | Admitting: Certified Registered Nurse Anesthetist

## 2013-11-18 ENCOUNTER — Ambulatory Visit (HOSPITAL_COMMUNITY): Payer: Commercial Managed Care - PPO

## 2013-11-18 ENCOUNTER — Encounter (HOSPITAL_COMMUNITY)
Admission: RE | Disposition: A | Discharge status: Home / Self Care | Payer: Self-pay | Source: Ambulatory Visit | Attending: Orthopedic Surgery

## 2013-11-18 DIAGNOSIS — Z79899 Other long term (current) drug therapy: Secondary | ICD-10-CM | POA: Insufficient documentation

## 2013-11-18 DIAGNOSIS — S42253A Displaced fracture of greater tuberosity of unspecified humerus, initial encounter for closed fracture: Secondary | ICD-10-CM | POA: Diagnosis not present

## 2013-11-18 DIAGNOSIS — F329 Major depressive disorder, single episode, unspecified: Secondary | ICD-10-CM | POA: Insufficient documentation

## 2013-11-18 DIAGNOSIS — F411 Generalized anxiety disorder: Secondary | ICD-10-CM | POA: Insufficient documentation

## 2013-11-18 DIAGNOSIS — K219 Gastro-esophageal reflux disease without esophagitis: Secondary | ICD-10-CM | POA: Insufficient documentation

## 2013-11-18 DIAGNOSIS — X58XXXA Exposure to other specified factors, initial encounter: Secondary | ICD-10-CM | POA: Insufficient documentation

## 2013-11-18 DIAGNOSIS — I8390 Asymptomatic varicose veins of unspecified lower extremity: Secondary | ICD-10-CM | POA: Insufficient documentation

## 2013-11-18 DIAGNOSIS — F3289 Other specified depressive episodes: Secondary | ICD-10-CM | POA: Insufficient documentation

## 2013-11-18 DIAGNOSIS — Y929 Unspecified place or not applicable: Secondary | ICD-10-CM | POA: Insufficient documentation

## 2013-11-18 HISTORY — PX: ORIF SHOULDER FRACTURE: SHX5035

## 2013-11-18 SURGERY — OPEN REDUCTION INTERNAL FIXATION (ORIF) SHOULDER FRACTURE
Anesthesia: General | Site: Shoulder | Laterality: Right

## 2013-11-18 MED ORDER — ONDANSETRON HCL 4 MG/2ML IJ SOLN
INTRAMUSCULAR | Status: DC | PRN
  Administered 2013-11-18: 4 mg via INTRAVENOUS

## 2013-11-18 MED ORDER — MIDAZOLAM HCL 2 MG/2ML IJ SOLN
INTRAMUSCULAR | Status: AC
  Administered 2013-11-18: 2 mg
  Filled 2013-11-18: qty 2

## 2013-11-18 MED ORDER — MIDAZOLAM HCL 2 MG/2ML IJ SOLN
INTRAMUSCULAR | Status: AC
  Filled 2013-11-18: qty 2

## 2013-11-18 MED ORDER — LIDOCAINE HCL (CARDIAC) 20 MG/ML IV SOLN
INTRAVENOUS | Status: DC | PRN
  Administered 2013-11-18: 30 mg via INTRAVENOUS
  Administered 2013-11-18: 60 mg via INTRATRACHEAL

## 2013-11-18 MED ORDER — PROPOFOL 10 MG/ML IV BOLUS
INTRAVENOUS | Status: AC
  Filled 2013-11-18: qty 20

## 2013-11-18 MED ORDER — GLYCOPYRROLATE 0.2 MG/ML IJ SOLN
INTRAMUSCULAR | Status: AC
  Filled 2013-11-18: qty 3

## 2013-11-18 MED ORDER — OXYCODONE HCL 5 MG PO TABS
ORAL_TABLET | ORAL | Status: AC
  Filled 2013-11-18: qty 1

## 2013-11-18 MED ORDER — PROMETHAZINE HCL 25 MG/ML IJ SOLN: 6.2500 mg | INTRAMUSCULAR | Status: DC | PRN

## 2013-11-18 MED ORDER — ARTIFICIAL TEARS OP OINT
TOPICAL_OINTMENT | OPHTHALMIC | Status: DC | PRN
  Administered 2013-11-18: 1 via OPHTHALMIC

## 2013-11-18 MED ORDER — EPHEDRINE SULFATE 50 MG/ML IJ SOLN
INTRAMUSCULAR | Status: DC | PRN
  Administered 2013-11-18: 10 mg via INTRAVENOUS
  Administered 2013-11-18 (×2): 15 mg via INTRAVENOUS
  Administered 2013-11-18: 5 mg via INTRAVENOUS

## 2013-11-18 MED ORDER — MEPERIDINE HCL 25 MG/ML IJ SOLN: 6.2500 mg | INTRAMUSCULAR | Status: DC | PRN

## 2013-11-18 MED ORDER — FENTANYL CITRATE 0.05 MG/ML IJ SOLN
INTRAMUSCULAR | Status: AC
  Administered 2013-11-18: 50 ug
  Filled 2013-11-18: qty 2

## 2013-11-18 MED ORDER — OXYCODONE HCL 5 MG/5ML PO SOLN: 5.0000 mg | Freq: Once | ORAL | Status: AC | PRN

## 2013-11-18 MED ORDER — ROCURONIUM BROMIDE 100 MG/10ML IV SOLN
INTRAVENOUS | Status: DC | PRN
  Administered 2013-11-18: 40 mg via INTRAVENOUS

## 2013-11-18 MED ORDER — ROCURONIUM BROMIDE 50 MG/5ML IV SOLN
INTRAVENOUS | Status: AC
  Filled 2013-11-18: qty 1

## 2013-11-18 MED ORDER — BUPIVACAINE-EPINEPHRINE (PF) 0.5% -1:200000 IJ SOLN
INTRAMUSCULAR | Status: DC | PRN
  Administered 2013-11-18: 30 mL via PERINEURAL

## 2013-11-18 MED ORDER — NEOSTIGMINE METHYLSULFATE 10 MG/10ML IV SOLN
INTRAVENOUS | Status: AC
  Filled 2013-11-18: qty 1

## 2013-11-18 MED ORDER — HYDROMORPHONE HCL PF 1 MG/ML IJ SOLN: 0.2500 mg | INTRAMUSCULAR | Status: DC | PRN

## 2013-11-18 MED ORDER — GLYCOPYRROLATE 0.2 MG/ML IJ SOLN
INTRAMUSCULAR | Status: DC | PRN
  Administered 2013-11-18: 0.6 mg via INTRAVENOUS

## 2013-11-18 MED ORDER — NEOSTIGMINE METHYLSULFATE 10 MG/10ML IV SOLN
INTRAVENOUS | Status: DC | PRN
  Administered 2013-11-18: 4 mg via INTRAVENOUS

## 2013-11-18 MED ORDER — DEXAMETHASONE SODIUM PHOSPHATE 4 MG/ML IJ SOLN
INTRAMUSCULAR | Status: DC | PRN
  Administered 2013-11-18: 4 mg via INTRAVENOUS

## 2013-11-18 MED ORDER — PROPOFOL 10 MG/ML IV BOLUS
INTRAVENOUS | Status: DC | PRN
  Administered 2013-11-18: 150 mg via INTRAVENOUS

## 2013-11-18 MED ORDER — PHENYLEPHRINE HCL 10 MG/ML IJ SOLN
10.0000 mg | INTRAVENOUS | Status: DC | PRN
  Administered 2013-11-18: 20 ug/min via INTRAVENOUS

## 2013-11-18 MED ORDER — OXYCODONE HCL 5 MG PO TABS
5.0000 mg | ORAL_TABLET | Freq: Once | ORAL | Status: AC | PRN
  Administered 2013-11-18: 5 mg via ORAL

## 2013-11-18 MED ORDER — ONDANSETRON HCL 4 MG/2ML IJ SOLN
INTRAMUSCULAR | Status: AC
  Filled 2013-11-18: qty 2

## 2013-11-18 MED ORDER — 0.9 % SODIUM CHLORIDE (POUR BTL) OPTIME
TOPICAL | Status: DC | PRN
  Administered 2013-11-18: 1000 mL

## 2013-11-18 MED ORDER — OXYCODONE-ACETAMINOPHEN 5-325 MG PO TABS: 1.0000 | ORAL_TABLET | ORAL | Status: DC | PRN

## 2013-11-18 MED ORDER — FENTANYL CITRATE 0.05 MG/ML IJ SOLN
INTRAMUSCULAR | Status: AC
  Filled 2013-11-18: qty 5

## 2013-11-18 MED ORDER — LACTATED RINGERS IV SOLN
INTRAVENOUS | Status: DC | PRN
  Administered 2013-11-18 (×2): via INTRAVENOUS

## 2013-11-18 MED ORDER — DIAZEPAM 5 MG PO TABS: 2.5000 mg | ORAL_TABLET | Freq: Four times a day (QID) | ORAL | Status: DC | PRN

## 2013-11-18 MED ORDER — MIDAZOLAM HCL 2 MG/2ML IJ SOLN: 0.5000 mg | Freq: Once | INTRAMUSCULAR | Status: DC | PRN

## 2013-11-18 MED ORDER — MIDAZOLAM HCL 5 MG/5ML IJ SOLN
INTRAMUSCULAR | Status: DC | PRN
  Administered 2013-11-18: 2 mg via INTRAVENOUS

## 2013-11-18 MED ORDER — ARTIFICIAL TEARS OP OINT
TOPICAL_OINTMENT | OPHTHALMIC | Status: AC
  Filled 2013-11-18: qty 3.5

## 2013-11-18 MED ORDER — LIDOCAINE HCL (CARDIAC) 20 MG/ML IV SOLN
INTRAVENOUS | Status: AC
  Filled 2013-11-18: qty 5

## 2013-11-18 MED ORDER — SCOPOLAMINE 1 MG/3DAYS TD PT72
1.0000 | MEDICATED_PATCH | Freq: Once | TRANSDERMAL | Status: DC
  Administered 2013-11-18: 1 via TRANSDERMAL

## 2013-11-18 MED ORDER — ONDANSETRON HCL 4 MG PO TABS: 4.0000 mg | ORAL_TABLET | Freq: Three times a day (TID) | ORAL | Status: DC | PRN

## 2013-11-18 MED ORDER — FENTANYL CITRATE 0.05 MG/ML IJ SOLN
INTRAMUSCULAR | Status: DC | PRN
  Administered 2013-11-18: 150 ug via INTRAVENOUS

## 2013-11-18 MED ORDER — SCOPOLAMINE 1 MG/3DAYS TD PT72
MEDICATED_PATCH | TRANSDERMAL | Status: AC
  Filled 2013-11-18: qty 1

## 2013-11-18 MED ORDER — DEXAMETHASONE SODIUM PHOSPHATE 4 MG/ML IJ SOLN
INTRAMUSCULAR | Status: AC
  Filled 2013-11-18: qty 1

## 2013-11-18 MED ORDER — LACTATED RINGERS IV SOLN
INTRAVENOUS | Status: DC
  Administered 2013-11-18: 11:00:00 via INTRAVENOUS

## 2013-11-18 SURGICAL SUPPLY — 57 items
ADH SKN CLS APL DERMABOND .7 (GAUZE/BANDAGES/DRESSINGS) ×2
ANCH SUT 2 SUTTK 14.5X3 BABSR (Orthopedic Implant) ×1 IMPLANT
ANCH SUT SWLK 19.1X4.75 VT (Anchor) ×1 IMPLANT
ANCHOR BIO-SUTURETAK 3X14.5 (Orthopedic Implant) ×2 IMPLANT
ANCHOR PEEK 4.75X19.1 SWLK C (Anchor) ×2 IMPLANT
ANCHOR PEEK CORKSCREW 4.5 (Anchor) ×2 IMPLANT
APL SKNCLS STERI-STRIP NONHPOA (GAUZE/BANDAGES/DRESSINGS)
BENZOIN TINCTURE PRP APPL 2/3 (GAUZE/BANDAGES/DRESSINGS) IMPLANT
CLSR STERI-STRIP ANTIMIC 1/2X4 (GAUZE/BANDAGES/DRESSINGS) IMPLANT
DERMABOND ADVANCED (GAUZE/BANDAGES/DRESSINGS) ×2
DERMABOND ADVANCED .7 DNX12 (GAUZE/BANDAGES/DRESSINGS) ×2 IMPLANT
DRAPE C-ARM 42X72 X-RAY (DRAPES) ×2 IMPLANT
DRAPE INCISE IOBAN 66X45 STRL (DRAPES) ×2 IMPLANT
DRAPE POUCH INSTRU U-SHP 10X18 (DRAPES) ×2 IMPLANT
DRAPE U-SHAPE 47X51 STRL (DRAPES) ×2 IMPLANT
DRSG EMULSION OIL 3X3 NADH (GAUZE/BANDAGES/DRESSINGS) IMPLANT
DRSG MEPILEX BORDER 4X4 (GAUZE/BANDAGES/DRESSINGS) ×2 IMPLANT
DRSG PAD ABDOMINAL 8X10 ST (GAUZE/BANDAGES/DRESSINGS) ×2 IMPLANT
ELECT REM PT RETURN 9FT ADLT (ELECTROSURGICAL) ×2
ELECTRODE REM PT RTRN 9FT ADLT (ELECTROSURGICAL) ×1 IMPLANT
GAUZE SPONGE 4X4 12PLY STRL (GAUZE/BANDAGES/DRESSINGS) IMPLANT
GLOVE BIO SURGEON STRL SZ7.5 (GLOVE) ×2 IMPLANT
GLOVE BIO SURGEON STRL SZ8 (GLOVE) ×2 IMPLANT
GLOVE EUDERMIC 7 POWDERFREE (GLOVE) ×2 IMPLANT
GLOVE SS BIOGEL STRL SZ 7.5 (GLOVE) ×1 IMPLANT
GLOVE SUPERSENSE BIOGEL SZ 7.5 (GLOVE) ×1
GOWN STRL REUS W/ TWL LRG LVL3 (GOWN DISPOSABLE) ×1 IMPLANT
GOWN STRL REUS W/ TWL XL LVL3 (GOWN DISPOSABLE) ×3 IMPLANT
GOWN STRL REUS W/TWL LRG LVL3 (GOWN DISPOSABLE) ×2
GOWN STRL REUS W/TWL XL LVL3 (GOWN DISPOSABLE) ×6
KIT BASIN OR (CUSTOM PROCEDURE TRAY) ×2 IMPLANT
KIT ROOM TURNOVER OR (KITS) ×2 IMPLANT
MANIFOLD NEPTUNE II (INSTRUMENTS) ×2 IMPLANT
NDL SUT 6 .5 CRC .975X.05 MAYO (NEEDLE) ×2 IMPLANT
NEEDLE 22X1 1/2 (OR ONLY) (NEEDLE) IMPLANT
NEEDLE MAYO TAPER (NEEDLE) ×2
NS IRRIG 1000ML POUR BTL (IV SOLUTION) ×2 IMPLANT
PACK SHOULDER (CUSTOM PROCEDURE TRAY) ×2 IMPLANT
PAD ARMBOARD 7.5X6 YLW CONV (MISCELLANEOUS) ×4 IMPLANT
SPONGE LAP 4X18 X RAY DECT (DISPOSABLE) ×2 IMPLANT
STRIP CLOSURE SKIN 1/2X4 (GAUZE/BANDAGES/DRESSINGS) ×2 IMPLANT
SUCTION FRAZIER TIP 10 FR DISP (SUCTIONS) ×2 IMPLANT
SUT BONE WAX W31G (SUTURE) ×2 IMPLANT
SUT ETHIBOND NAB CT1 #1 30IN (SUTURE) IMPLANT
SUT FIBERWIRE #2 38 T-5 BLUE (SUTURE) ×4
SUT MNCRL AB 3-0 PS2 18 (SUTURE) ×2 IMPLANT
SUT VIC AB 0 CT1 27 (SUTURE) ×2
SUT VIC AB 0 CT1 27XBRD ANBCTR (SUTURE) ×2 IMPLANT
SUT VIC AB 2-0 CT1 27 (SUTURE) ×2
SUT VIC AB 2-0 CT1 TAPERPNT 27 (SUTURE) ×1 IMPLANT
SUT VICRYL 4-0 PS2 18IN ABS (SUTURE) IMPLANT
SUTURE FIBERWR #2 38 T-5 BLUE (SUTURE) ×2 IMPLANT
SYR CONTROL 10ML LL (SYRINGE) ×2 IMPLANT
TOWEL OR 17X24 6PK STRL BLUE (TOWEL DISPOSABLE) ×2 IMPLANT
TOWEL OR 17X26 10 PK STRL BLUE (TOWEL DISPOSABLE) ×2 IMPLANT
WATER STERILE IRR 1000ML POUR (IV SOLUTION) IMPLANT
YANKAUER SUCT BULB TIP NO VENT (SUCTIONS) IMPLANT

## 2013-11-18 NOTE — Anesthesia Procedure Notes (Addendum)
Anesthesia Regional Block:  Interscalene brachial plexus block  Pre-Anesthetic Checklist: ,, timeout performed, Correct Patient, Correct Site, Correct Laterality, Correct Procedure, Correct Position, site marked, Risks and benefits discussed,  Surgical consent,  Pre-op evaluation,  At surgeon's request and post-op pain management  Laterality: Right and Upper  Prep: chloraprep       Needles:  Injection technique: Single-shot  Needle Type: Echogenic Stimulator Needle      Needle Gauge: 22 and 22 G    Additional Needles:  Procedures: nerve stimulator Interscalene brachial plexus block  Nerve Stimulator or Paresthesia:  Response: forearm twitch, 0.4 mA, 0.1 ms,   Additional Responses:   Narrative:  Start time: 11/18/2013 11:07 AM End time: 11/18/2013 11:12 AM Injection made incrementally with aspirations every 5 mL.  Performed by: Personally  Anesthesiologist: Jenita Seashore, MD  Additional Notes: Pt identified in Holding room.  Monitors applied. Working IV access confirmed. Sterile prep R neck.  #22ga PNS to forearm twitch at 0.83m threshold.  30cc 0.5% Bupivacaine with 1:200k epi injected incrementally after negative test dose.  Patient asymptomatic, VSS, no heme aspirated, tolerated well.  CJenita Seashore MD   Procedure Name: Intubation Date/Time: 11/18/2013 1:28 PM Performed by: YMary[place] PinkPre-anesthesia Checklist: Patient identified, Emergency Drugs available, Suction available, Patient being monitored and Timeout performed Patient Re-evaluated:Patient Re-evaluated prior to inductionOxygen Delivery Method: Circle system utilized Preoxygenation: Pre-oxygenation with 100% oxygen Intubation Type: IV induction Ventilation: Mask ventilation without difficulty Laryngoscope Size: Mac and 3 Grade View: Grade II Tube type: Oral Tube size: 7.0 mm Number of attempts: 1 Airway Equipment and Method: Stylet and LTA kit utilized Placement Confirmation: ETT inserted through vocal cords  under direct vision,  positive ETCO2 and breath sounds checked- equal and bilateral Secured at: 21 cm Tube secured with: Tape Dental Injury: Teeth and Oropharynx as per pre-operative assessment

## 2013-11-18 NOTE — Op Note (Signed)
11/18/2013  3:01 PM  PATIENT:   Jessica Huang  58 y.o. female  PRE-OPERATIVE DIAGNOSIS:  Displaced right shoulder great tuberosity fracture  POST-OPERATIVE DIAGNOSIS:  same  PROCEDURE:  ORIF right shoulder greater tuberosity fx  SURGEON:  Jarred Purtee, Metta Clines M.D.  ASSISTANTS: Shuford pac   ANESTHESIA:   GET + ISB  EBL: <50cc  SPECIMEN:  none  Drains: none   PATIENT DISPOSITION:  PACU - hemodynamically stable.    PLAN OF CARE: Discharge to home after PACU  Dictation# [id number]

## 2013-11-18 NOTE — Transfer of Care (Signed)
Immediate Anesthesia Transfer of Care Note  Patient: Jessica Huang  Procedure(s) Performed: Procedure(s): OPEN REDUCTION INTERNAL FIXATION (ORIF) RIGHT SHOULDER TUBEROSITY FRACTURE (Right)  Patient Location: PACU  Anesthesia Type:GA combined with regional for post-op pain  Level of Consciousness: awake, alert  and oriented  Airway & Oxygen Therapy: Patient Spontanous Breathing and Patient connected to nasal cannula oxygen  Post-op Assessment: Report given to PACU RN and Post -op Vital signs reviewed and stable  Post vital signs: Reviewed and stable  Complications: No apparent anesthesia complications

## 2013-11-18 NOTE — H&P (Signed)
Jessica Huang    Chief Complaint: right shoulder great tuberosity fracture HPI: The patient is a 58 y.o. female with a displaced right shoulder greater tuberosity fracture  Past Medical History  Diagnosis Date  . Anxiety and depression     takes Wellbutrin and Prozac daily  . Varicose veins   . Osteopenia 2004    Dexa, hip  . GERD (gastroesophageal reflux disease) 09-2012    dx by ENT   . Headache(784.0) last time at least a yr ago    migraine-occasionally  . Joint pain   . Joint swelling   . Back pain     buldging disc  . Osteopenia   . Anxiety     takes Xanax daily    Past Surgical History  Procedure Laterality Date  . Appendectomy    . Tonsillectomy    . Varicose vein surgery  2014    laser   . Colonoscopy      Family History  Problem Relation Age of Onset  . Other Mother     valve replacement--M  . Breast cancer Mother     M late 10s  . COPD Father     smoker  . Heart attack Neg Hx   . Colon cancer Neg Hx   . Heart failure Mother     M  . Diabetes Other     GPs , late onset    Social History:  reports that she has never smoked. She has never used smokeless tobacco. She reports that she does not drink alcohol or use illicit drugs.  Allergies:  Allergies  Allergen Reactions  . Penicillins Rash    CHILDHOOD ALLERGY    Medications Prior to Admission  Medication Sig Dispense Refill  . ALPRAZolam (XANAX) 0.5 MG tablet TAKE ONE AND ONE-HALF TABLETS BY MOUTH EVERY DAY IN THE MORNING AND  AT  BEDTIME  AS  NEEDED  90 tablet  1  . buPROPion (WELLBUTRIN XL) 150 MG 24 hr tablet Take 300 mg by mouth daily.      . diazepam (VALIUM) 5 MG tablet Take 2.5-5 mg by mouth every 6 (six) hours as needed for muscle spasms.      Marland Kitchen FLUoxetine (PROZAC) 20 MG tablet Take 20 mg by mouth daily.      Marland Kitchen oxyCODONE-acetaminophen (PERCOCET/ROXICET) 5-325 MG per tablet Take 1 tablet by mouth every 4 (four) hours as needed for severe pain.      . traMADol (ULTRAM) 50 MG tablet Take  50 mg by mouth every 6 (six) hours as needed for moderate pain.      Marland Kitchen ALPRAZolam (XANAX) 0.5 MG tablet Take 0.75 mg by mouth 2 (two) times daily as needed for anxiety or sleep.         Physical Exam: right shoulder with painful and restricted motion as noted at recent office visit  Vitals  Temp:  [98.2 F (36.8 C)] 98.2 F (36.8 C) (08/06 1116) Pulse Rate:  [71] 71 (08/06 1116) Resp:  [16] 16 (08/06 1116) BP: (163)/(82) 163/82 mmHg (08/06 1116) SpO2:  [97 %] 97 % (08/06 1116) Weight:  [98.431 kg (217 lb)] 98.431 kg (217 lb) (08/06 1116)  Assessment/Plan  Impression: Displaced right shoulder great tuberosity fracture  Plan of Action: Procedure(s): OPEN REDUCTION INTERNAL FIXATION (ORIF) RIGHT SHOULDER TUBEROSITY FRACTURE  Jessica Huang M 11/18/2013, 12:42 PM

## 2013-11-18 NOTE — Op Note (Signed)
NAMEMITCHELL, EPLING                ACCOUNT NO.:  [id number]  MEDICAL RECORD NO.:  [id number]  LOCATION:  [facility]                         FACILITY:  [facility]  PHYSICIAN:  Metta Clines. Zidan Helget, M.D.  DATE OF BIRTH:  1956/03/01  DATE OF PROCEDURE:  11/18/2013 DATE OF DISCHARGE:  11/18/2013                              OPERATIVE REPORT   PREOPERATIVE DIAGNOSIS:  Right shoulder displaced greater tuberosity fracture.  POSTOPERATIVE DIAGNOSIS:  Right shoulder displaced greater tuberosity fracture.  PROCEDURE:  Open reduction and internal fixation of right shoulder displaced greater tuberosity fracture using a suture anchor construct.  SURGEON:  Metta Clines. Deni Lefever, MD  ASSISTANT:  Reather Laurence. Shuford, PA-C  ANESTHESIA:  General endotracheal as well as interscalene block.  BLOOD LOSS:  Less than 50 mL.  DRAINS:  None.  HISTORY:  Ms. Filosa is a 58 year old female who approximately 3 weeks ago fell at home injuring the right shoulder sustaining a greater tuberosity fracture with displacement.  She had been seen at an outside facility and subsequently followed up at our office where repeat x-rays were obtained confirming displacement of a fragment of the greater tuberosity with insertion of the supraspinatus and in addition a triangular fragment involving the majority of the greater tuberosity which was minimally displaced.  Due to the degree of displacement of the smaller fragment of bone, approximately 1.5 x 2 cm at the apex of the tuberosity with displacement towards the acromion showed that she was at significant risk for impingement in the future as well as for an associated defect in the rotator cuff.  Given the degree of displacement, we discussed with Ms. Nourse, the possibility of open reduction and internal fixation and risks versus benefits thereof. Possible surgical complications were all reviewed including potential for bleeding, infection, neurovascular injury, failure of  fixation, anesthetic complication, possible need for additional surgery.  She understands and accepts and agrees for right shoulder surgery with plan for open reduction and internal fixation with either suture construct or hardware depending on pattern of fracture and quality bone.  PROCEDURE IN DETAIL:  After undergoing routine preop evaluation, the patient received prophylactic antibiotics.  An interscalene block was established in the holding area by the Anesthesia Department, placed supine on operative table, underwent smooth induction of a general endotracheal anesthesia.  Placed in beach-chair position and appropriate padding protected.  Right shoulder girdle was then sterilely prepped and draped in standard fashion.  Time-out was called.  A lateral deltoid splitting approach to the greater tuberosity was made through a 5-cm incision off the lateral margin of the acromion.  Skin flaps were elevated and electrocautery was used for hemostasis.  The deltoid was then split along the line of its fibers and self-retaining retractor were placed and digital dissection was used to divide adhesions throughout the subacromial/subdeltoid bursectomy.  This allowed immediate visualization of the displaced bone fragment which was quite prominent and pinging upon the acromion.  I carefully divided the soft tissues around the displaced fragment of bone and obtained good mobility in this region.  Given the dimensions of the fracture fragment in the overall construct, felt this would best be treated with a suture construct.  With this  plan, we utilized a 4.5  Corkscrew suture anchor and placed this into the bed of the displaced fracture fragment medially adjacent to the articular margin.  The 4 suture limbs were then passed through the rotator cuff distally at the bone-tendon junction in a horizontal mattress pattern and we did use fluoroscopic imaging to confirm that these sutures were passed  appropriately around the bone fragment.  These sutures were then tied initially with a number of overhand throws which reduced the rotator cuff tendon, get down to the osteoarticular margin of the humeral head, debriding rotator cuff back in excellent position and then we created "suture bridge" with 2 SwiveLock anchors which were placed slightly anterior and posterior straddling the apex of the larger tuberosity fracture fragment obtaining purchase into good solid metaphyseal bone and the suture bridge then allowed [place] to allow an excellent reduction of the bone fragment back into a nearly anatomic position, the overall construct and stability was much to our satisfaction.  We obtained final fluoroscopic images which showed good position of the fracture fragments.  The wound was then copiously irrigated.  Hemostasis was obtained.  The deltoid split was then repaired with figure-of-eight #1 Vicryl sutures, 2-0 Vicryl was used for the subcu layer, and intracuticular 3-0 Monocryl for the skin followed by Steri-Strips and a dry dressing.  Right arm was then placed in a sling.  The patient was awakened, extubated, and taken to recovery room in stable condition.  Jenetta Loges, PA-C was used as an Environmental consultant throughout this case essential for help with positioning of the patient, positioning of the extremity, tissue manipulation, retraction, wound closure, and intraoperative decision making.     Metta Clines. Lindzie Boxx, M.D.     KMS/MEDQ  D:  11/18/2013  T:  11/18/2013  Job:  [id number]

## 2013-11-18 NOTE — Anesthesia Preprocedure Evaluation (Addendum)
Anesthesia Evaluation  Patient identified by MRN, date of birth, ID band Patient awake    Reviewed: Allergy & Precautions, H&P , NPO status , Patient's Chart, lab work & pertinent test results  History of Anesthesia Complications Negative for: history of anesthetic complications  Airway Mallampati: I  Neck ROM: Full    Dental  (+) Teeth Intact, Dental Advisory Given   Pulmonary neg pulmonary ROS,  breath sounds clear to auscultation  Pulmonary exam normal       Cardiovascular negative cardio ROS  Rhythm:Regular Rate:Normal     Neuro/Psych Anxiety Depression negative neurological ROS     GI/Hepatic GERD-  Controlled,  Endo/Other  Morbid obesity  Renal/GU negative Renal ROS     Musculoskeletal   Abdominal (+) + obese,   Peds  Hematology negative hematology ROS (+)   Anesthesia Other Findings   Reproductive/Obstetrics                          Anesthesia Physical Anesthesia Plan  ASA: II  Anesthesia Plan: General   Post-op Pain Management:    Induction: Intravenous  Airway Management Planned: Oral ETT  Additional Equipment:   Intra-op Plan:   Post-operative Plan: Extubation in OR  Informed Consent: I have reviewed the patients History and Physical, chart, labs and discussed the procedure including the risks, benefits and alternatives for the proposed anesthesia with the patient or authorized representative who has indicated his/her understanding and acceptance.   Dental advisory given  Plan Discussed with: CRNA and Surgeon  Anesthesia Plan Comments: (Plan routine monitors, GETA with interscalene block for post op analgesia)        Anesthesia Quick Evaluation

## 2013-11-18 NOTE — Discharge Instructions (Signed)
You may allow arm out of sling to allow arm to dangle and move elbow wrist and hand. You may change dressing and shower on day 3. Leave the dermibond along incision. Follow up in 7-10 days

## 2013-11-19 ENCOUNTER — Encounter (HOSPITAL_COMMUNITY): Payer: Self-pay | Admitting: Orthopedic Surgery

## 2013-11-22 NOTE — Anesthesia Postprocedure Evaluation (Signed)
  Anesthesia Post-op Note  Patient: Jessica Huang  Procedure(s) Performed: Procedure(s): OPEN REDUCTION INTERNAL FIXATION (ORIF) RIGHT SHOULDER TUBEROSITY FRACTURE (Right)  Patient Location: PACU  Anesthesia Type:General  Level of Consciousness: awake and alert   Airway and Oxygen Therapy: Patient Spontanous Breathing  Post-op Pain: none  Post-op Assessment: Post-op Vital signs reviewed  Post-op Vital Signs: stable  Last Vitals:  Filed Vitals:   11/18/13 1700  BP: 140/78  Pulse: 76  Temp: 36.7 C  Resp: 18    Complications: No apparent anesthesia complications

## 2013-11-26 ENCOUNTER — Encounter: Payer: Self-pay | Admitting: Internal Medicine

## 2013-12-23 ENCOUNTER — Encounter: Payer: Self-pay | Admitting: Internal Medicine

## 2014-01-03 ENCOUNTER — Other Ambulatory Visit: Payer: Self-pay | Admitting: Internal Medicine

## 2014-01-03 ENCOUNTER — Telehealth: Payer: Self-pay

## 2014-01-03 MED ORDER — ALPRAZOLAM 0.5 MG PO TABS: ORAL_TABLET | ORAL | Status: DC

## 2014-01-03 NOTE — Telephone Encounter (Signed)
Will check w/ pt before RF

## 2014-01-03 NOTE — Telephone Encounter (Signed)
Pt is requesting refill on Tramadol and Alprazolam.  Last OV: 11/01/2013 UDS: 11/02/2013: Low Risk  Alprazolam discontinued on 11/18/2013 Tramadol never filled.    Please advise.

## 2014-01-03 NOTE — Telephone Encounter (Signed)
Spoke with the patient, was prescribed valium and  Percocet by ortho She's not taking Valium but is taking half Percocet 3-4 times a day. Plan:  Discontinue valium, ok to refill Xanax take Percocet, hold ultram (RF percocet per ortho) Please put  Prescription at the front desk

## 2014-01-04 NOTE — Telephone Encounter (Signed)
Medication placed at front desk for pick up.

## 2014-01-14 ENCOUNTER — Telehealth: Payer: Self-pay | Admitting: Internal Medicine

## 2014-01-14 NOTE — Telephone Encounter (Signed)
A user error has taken place.

## 2014-02-02 ENCOUNTER — Other Ambulatory Visit: Payer: Self-pay

## 2014-02-02 ENCOUNTER — Telehealth: Payer: Self-pay | Admitting: Internal Medicine

## 2014-02-02 MED ORDER — BUPROPION HCL ER (XL) 150 MG PO TB24: 300.0000 mg | ORAL_TABLET | Freq: Every day | ORAL | Status: DC

## 2014-02-02 NOTE — Telephone Encounter (Signed)
Caller name: Zissel Relation to pt: Call back [place]: [place]  Reason for call: pt is wanting a refill on Rx buPROPion (WELLBUTRIN XL) 150 MG 24 hr tablet  -pt is going out of town, and would like to have this before then.  Thanks

## 2014-02-02 NOTE — Telephone Encounter (Signed)
Wellbutrin sent to [facility].

## 2014-02-03 ENCOUNTER — Other Ambulatory Visit: Payer: Self-pay

## 2014-02-03 MED ORDER — BUPROPION HCL ER (XL) 150 MG PO TB24: 300.0000 mg | ORAL_TABLET | Freq: Every day | ORAL | Status: DC

## 2014-02-21 ENCOUNTER — Other Ambulatory Visit: Payer: Self-pay

## 2014-02-21 MED ORDER — FLUOXETINE HCL 20 MG PO TABS: 20.0000 mg | ORAL_TABLET | Freq: Every day | ORAL | Status: DC

## 2014-03-07 ENCOUNTER — Other Ambulatory Visit: Payer: Self-pay

## 2014-04-20 ENCOUNTER — Telehealth: Payer: Self-pay

## 2014-04-20 MED ORDER — ALPRAZOLAM 0.5 MG PO TABS: ORAL_TABLET | ORAL | Status: AC

## 2014-04-20 NOTE — Telephone Encounter (Signed)
done

## 2014-04-20 NOTE — Telephone Encounter (Signed)
Faxed to [facility]

## 2014-04-20 NOTE — Telephone Encounter (Signed)
Pt is requesting refill on Alprazolam  Last OV: 11/01/2013, CPE scheduled for 05/16/2014 Last Fill: 01/03/2014 # [id number] UDS: 11/02/2013 Low risk  Please advise.

## 2014-05-16 ENCOUNTER — Ambulatory Visit (INDEPENDENT_AMBULATORY_CARE_PROVIDER_SITE_OTHER): Payer: Commercial Managed Care - PPO | Admitting: Internal Medicine

## 2014-05-16 ENCOUNTER — Encounter: Payer: Self-pay | Admitting: Internal Medicine

## 2014-05-16 VITALS — BP 143/87 | HR 80 | Temp 98.1°F | Ht 65.0 in | Wt 211.2 lb

## 2014-05-16 DIAGNOSIS — F419 Anxiety disorder, unspecified: Secondary | ICD-10-CM

## 2014-05-16 DIAGNOSIS — M8580 Other specified disorders of bone density and structure, unspecified site: Secondary | ICD-10-CM | POA: Insufficient documentation

## 2014-05-16 DIAGNOSIS — F32A Depression, unspecified: Secondary | ICD-10-CM

## 2014-05-16 DIAGNOSIS — Z0000 Encounter for general adult medical examination without abnormal findings: Secondary | ICD-10-CM

## 2014-05-16 DIAGNOSIS — F329 Major depressive disorder, single episode, unspecified: Secondary | ICD-10-CM

## 2014-05-16 LAB — LIPID PANEL
CHOLESTEROL: 205 mg/dL — AB (ref 0–200)
HDL: 54.5 mg/dL (ref >39.00)
LDL Cholesterol: 120 mg/dL — ABNORMAL HIGH (ref 0–99)
NONHDL: 150.5
TRIGLYCERIDES: 155 mg/dL — AB (ref 0.0–149.0)
Total CHOL/HDL Ratio: 4
VLDL: 31 mg/dL (ref 0.0–40.0)

## 2014-05-16 NOTE — Assessment & Plan Note (Addendum)
Td 2010 Had a  flu shot  Shingles immunization discussed before Cscope neg 1-09 labs from the last few months reviewed, will check FLP EKG normal sinus rhythm Female care: sees gynecology, gets her  MMG there  Patient is counseled about diet-exercise

## 2014-05-16 NOTE — Patient Instructions (Signed)
Get your blood work before you leave   Check the  blood pressure   Monthly  Be sure your blood pressure is between 110/65 and  145/85.  if it is consistently higher or lower, let me know     Please come back to the office in 6 months  for a routine check up  No fasting

## 2014-05-16 NOTE — Progress Notes (Signed)
Pre visit review using our clinic review tool, if applicable. No additional management support is needed unless otherwise documented below in the visit note. 

## 2014-05-16 NOTE — Assessment & Plan Note (Signed)
Well-controlled with current meds, return to the office 6 months

## 2014-05-16 NOTE — Progress Notes (Signed)
Subjective:    Patient ID: Jessica Huang, female    DOB: 10/30/1955, 59 y.o.   MRN: [id number]  DOS:  05/16/2014 Type of visit - description : cpx Interval history: No concerns, doing well, had a recent fracture, recovering well  ROS Denies chest pain or difficulty breathing No nausea, vomiting, diarrhea No cough or sputum production No dysuria, gross hematuria difficulty urinating  Past Medical History  Diagnosis Date  . Anxiety and depression     takes Wellbutrin and Prozac daily  . Varicose veins   . Osteopenia 2004    Dexa, hip  . GERD (gastroesophageal reflux disease) 09-2012    dx by ENT   . Headache(784.0) last time at least a yr ago    migraine-occasionally  . Joint pain   . Joint swelling   . Back pain     buldging disc  . Anxiety     takes Xanax daily    Past Surgical History  Procedure Laterality Date  . Appendectomy    . Tonsillectomy    . Varicose vein surgery  2014    laser   . Colonoscopy    . Orif shoulder fracture Right 11/18/2013    Procedure: OPEN REDUCTION INTERNAL FIXATION (ORIF) RIGHT SHOULDER TUBEROSITY FRACTURE;  Surgeon: Marin Shutter, MD;  Location: [facility];  Service: Orthopedics;  Laterality: Right;    History   Social History  . Marital Status: Divorced    Spouse Name: N/A    Number of Children: 2  . Years of Education: N/A   Occupational History  . Private school teacher, 7th grade    Social History Main Topics  . Smoking status: Never Smoker   . Smokeless tobacco: Never Used  . Alcohol Use: 0.0 oz/week    0 Not specified per week     Comment: socially   . Drug Use: No  . Sexual Activity: Not Currently    Birth Control/ Protection: Post-menopausal   Other Topics Concern  . Not on file   Social History Narrative   divorce, 2 children lives at home      Family History  Problem Relation Age of Onset  . Other Mother     valve replacement--M  . Breast cancer Mother     M late 49s  . COPD Father     smoker  . Heart  attack Neg Hx   . Colon cancer Neg Hx   . Heart failure Mother     M  . Diabetes Other     GPs , late onset       Medication List       This list is accurate as of: 05/16/14  7:00 PM.  Always use your most recent med list.               ALPRAZolam 0.5 MG tablet  Commonly known as:  XANAX  TAKE ONE AND ONE-HALF TABLETS BY MOUTH EVERY DAY IN THE MORNING AND  AT  BEDTIME  AS  NEEDED     buPROPion 150 MG 24 hr tablet  Commonly known as:  WELLBUTRIN XL  Take 2 tablets (300 mg total) by mouth daily.     FLUoxetine 20 MG tablet  Commonly known as:  PROZAC  Take 1 tablet (20 mg total) by mouth daily.     fluticasone 50 MCG/ACT nasal spray  Commonly known as:  FLONASE  Place 2 sprays into both nostrils daily as needed for allergies or rhinitis.  traMADol 50 MG tablet  Commonly known as:  ULTRAM  Take 50 mg by mouth every 6 (six) hours as needed for moderate pain.           Objective:   Physical Exam  Constitutional: She is oriented to person, place, and time. She appears well-developed and well-nourished. No distress.  Neck: Normal range of motion. Neck supple. No tracheal deviation present. No thyromegaly present.  Normal carotid pulse B  Cardiovascular:  RRR, no murmur or Gallop  Pulmonary/Chest: Effort normal. No stridor. No respiratory distress.  CTA B  Abdominal: Soft. Bowel sounds are normal. She exhibits no distension and no mass. There is no tenderness. There is no rebound and no guarding.  No organomegaly  Musculoskeletal: She exhibits no edema or tenderness.  Lymphadenopathy:    She has no cervical adenopathy.  Neurological: She is alert and oriented to person, place, and time. No cranial nerve deficit. She exhibits normal muscle tone. Coordination normal.  Speech normal, gait unassisted and normal for age, motor strength appropriate for age   Skin: Skin is warm and dry. No pallor.  No jaundice  Psychiatric: She has a normal mood and affect. Her behavior  is normal. Judgment and thought content normal.   BP 143/87 mmHg  Pulse 80  Temp(Src) 98.1 F (36.7 C) (Oral)  Ht 5\' 5"  (1.651 m)  Wt 211 lb 4 oz (95.822 kg)  BMI 35.15 kg/m2  SpO2 97%      Assessment & Plan:

## 2014-07-15 ENCOUNTER — Telehealth: Payer: Self-pay | Admitting: Internal Medicine

## 2014-07-15 MED ORDER — TRAMADOL HCL 50 MG PO TABS: 50.0000 mg | ORAL_TABLET | Freq: Four times a day (QID) | ORAL | Status: DC | PRN

## 2014-07-15 NOTE — Telephone Encounter (Signed)
Rx faxed to [place] on [address].

## 2014-07-15 NOTE — Telephone Encounter (Signed)
Ok 60, no Rf

## 2014-07-15 NOTE — Telephone Encounter (Signed)
Pt is requesting refill on Tramadol.   Last OV: 05/16/2014 Last Fill: 11/01/2013 # 60 0RF UDS: 11/02/2013 Low risk  Please advise.

## 2014-07-15 NOTE — Telephone Encounter (Signed)
Rx printed, awaiting signature from Dr. Larose Kells.

## 2014-07-15 NOTE — Telephone Encounter (Signed)
Caller name: Savannah Relation to pt: self Call back number: [phone] Pharmacy: [facility] [address]  Reason for call:   Requesting tramadol refill

## 2014-08-08 ENCOUNTER — Other Ambulatory Visit: Payer: Self-pay | Admitting: Internal Medicine

## 2014-08-22 ENCOUNTER — Other Ambulatory Visit: Payer: Self-pay | Admitting: Internal Medicine

## 2015-02-10 ENCOUNTER — Other Ambulatory Visit: Payer: Self-pay | Admitting: Internal Medicine

## 2015-02-10 ENCOUNTER — Other Ambulatory Visit: Payer: Self-pay

## 2015-02-13 ENCOUNTER — Encounter: Payer: Self-pay | Admitting: Internal Medicine

## 2015-02-13 ENCOUNTER — Ambulatory Visit (INDEPENDENT_AMBULATORY_CARE_PROVIDER_SITE_OTHER): Payer: Commercial Managed Care - PPO | Admitting: Internal Medicine

## 2015-02-13 VITALS — BP 122/74 | HR 66 | Temp 97.8°F | Ht 65.0 in | Wt 221.5 lb

## 2015-02-13 DIAGNOSIS — H1013 Acute atopic conjunctivitis, bilateral: Secondary | ICD-10-CM | POA: Diagnosis not present

## 2015-02-13 DIAGNOSIS — F32A Depression, unspecified: Secondary | ICD-10-CM

## 2015-02-13 DIAGNOSIS — F418 Other specified anxiety disorders: Secondary | ICD-10-CM | POA: Diagnosis not present

## 2015-02-13 DIAGNOSIS — Z09 Encounter for follow-up examination after completed treatment for conditions other than malignant neoplasm: Secondary | ICD-10-CM

## 2015-02-13 DIAGNOSIS — F329 Major depressive disorder, single episode, unspecified: Secondary | ICD-10-CM

## 2015-02-13 DIAGNOSIS — F419 Anxiety disorder, unspecified: Principal | ICD-10-CM

## 2015-02-13 MED ORDER — FLUOXETINE HCL 20 MG PO TABS: 20.0000 mg | ORAL_TABLET | Freq: Every day | ORAL | Status: DC

## 2015-02-13 MED ORDER — NEDOCROMIL SODIUM 2 % OP SOLN: 2.0000 [drp] | Freq: Every day | OPHTHALMIC | Status: DC | PRN

## 2015-02-13 MED ORDER — BUPROPION HCL ER (XL) 150 MG PO TB24: 300.0000 mg | ORAL_TABLET | Freq: Every day | ORAL | Status: DC

## 2015-02-13 NOTE — Patient Instructions (Signed)
Use the eyedrops daily for 2 or 3 months and then as needed Please see your eye doctor    Next visit  for a physical exam in 6-8 months Please schedule an appointment at the front desk Please come back fasting

## 2015-02-13 NOTE — Progress Notes (Signed)
Pre visit review using our clinic review tool, if applicable. No additional management support is needed unless otherwise documented below in the visit note. 

## 2015-02-13 NOTE — Progress Notes (Signed)
Subjective:    Patient ID: Jessica Huang, female    DOB: Feb 04, 1956, 59 y.o.   MRN: [id number]  DOS:  02/13/2015 Type of visit - description : Routine checkup Interval history: Anxiety depression: Symptoms currently well controlled, needs a refill. She tried to decrease the dose of her medicines and that didn't work well for her, she got anxious again. Also, complaining of watery eyes in the morning along with a dry crust.   Review of Systems  Denies runny nose, sore throat. Eyes are not red or itchy. She did move to a brand-new town-house few months ago.  Past Medical History  Diagnosis Date  . Anxiety and depression     takes Wellbutrin and Prozac daily  . Varicose veins   . Osteopenia 2004    Dexa, hip  . GERD (gastroesophageal reflux disease) 09-2012    dx by ENT   . Headache(784.0) last time at least a yr ago    migraine-occasionally  . Joint pain   . Joint swelling   . Back pain     buldging disc  . Anxiety     takes Xanax daily    Past Surgical History  Procedure Laterality Date  . Appendectomy    . Tonsillectomy    . Varicose vein surgery  2014    laser   . Colonoscopy    . Orif shoulder fracture Right 11/18/2013    Procedure: OPEN REDUCTION INTERNAL FIXATION (ORIF) RIGHT SHOULDER TUBEROSITY FRACTURE;  Surgeon: Marin Shutter, MD;  Location: [facility];  Service: Orthopedics;  Laterality: Right;    Social History   Social History  . Marital Status: Divorced    Spouse Name: N/A  . Number of Children: 2  . Years of Education: N/A   Occupational History  . Private school teacher, 7th grade    Social History Main Topics  . Smoking status: Never Smoker   . Smokeless tobacco: Never Used  . Alcohol Use: 0.0 oz/week    0 Standard drinks or equivalent per week     Comment: socially   . Drug Use: No  . Sexual Activity: Not Currently    Birth Control/ Protection: Post-menopausal   Other Topics Concern  . Not on file   Social History Narrative   divorce,  2 children , one lives at home         Medication List       This list is accurate as of: 02/13/15 11:59 PM.  Always use your most recent med list.               buPROPion 150 MG 24 hr tablet  Commonly known as:  WELLBUTRIN XL  Take 2 tablets (300 mg total) by mouth daily.     FLUoxetine 20 MG tablet  Commonly known as:  PROZAC  Take 1 tablet (20 mg total) by mouth daily.     fluticasone 50 MCG/ACT nasal spray  Commonly known as:  FLONASE  Place 2 sprays into both nostrils daily as needed for allergies or rhinitis.     nedocromil 2 % ophthalmic solution  Commonly known as:  ALOCRIL  Place 2 drops into both eyes daily as needed.           Objective:   Physical Exam BP 122/74 mmHg  Pulse 66  Temp(Src) 97.8 F (36.6 C) (Oral)  Ht 5\' 5"  (1.651 m)  Wt 221 lb 8 oz (100.472 kg)  BMI 36.86 kg/m2  SpO2 99% General:  Well developed, well nourished . NAD.  HEENT:  Normocephalic . Face symmetric, atraumatic. Conjunctiva: No red, no discharge. EOMI, pupils equal and reactive Lungs:  CTA B Normal respiratory effort, no intercostal retractions, no accessory muscle use. Heart: RRR,  no murmur.  No pretibial edema bilaterally  Skin: Not pale. Not jaundice Neurologic:  alert & oriented X3.  Speech normal, gait appropriate for age and unassisted Psych--  Cognition and judgment appear intact.  Cooperative with normal attention span and concentration.  Behavior appropriate. No anxious or depressed appearing.      Assessment & Plan:   Assessment> Anxiety depression Osteopenia GERD DX by ENT 2014  Plan: Anxiety depression: Symptoms well-controlled coronary medications, the past she tried twice to decrease the medicine and that didn't work well for her. We'll continue with same meds and periodically continue reviewing the meds. Allergic conjunctivitis: Symptoms as described above likely due to allergic conjunctivitis, she moved to a brand-new house few months ago  and I wonder if that is causing her symptoms (new carpet, etc) . Rx alocril, at the same time recommend to see her eye doctor. RTC 6-8 moths

## 2015-02-14 DIAGNOSIS — Z09 Encounter for follow-up examination after completed treatment for conditions other than malignant neoplasm: Secondary | ICD-10-CM | POA: Insufficient documentation

## 2015-02-14 NOTE — Assessment & Plan Note (Signed)
Anxiety depression: Symptoms well-controlled coronary medications, the past she tried twice to decrease the medicine and that didn't work well for her. We'll continue with same meds and periodically continue reviewing the meds. Allergic conjunctivitis: Symptoms as described above likely due to allergic conjunctivitis, she moved to a brand-new house few months ago and I wonder if that is causing her symptoms (new carpet, etc) . Rx alocril, at the same time recommend to see her eye doctor. RTC 6-8 moths

## 2015-02-27 ENCOUNTER — Other Ambulatory Visit: Payer: Self-pay | Admitting: Internal Medicine

## 2015-02-27 NOTE — Telephone Encounter (Signed)
Rx printed, awaiting MD signature.  

## 2015-02-27 NOTE — Telephone Encounter (Signed)
Rx faxed to [facility] pharmacy  

## 2015-02-27 NOTE — Telephone Encounter (Signed)
Used to take tramadol, okay to refill #30.

## 2015-02-27 NOTE — Telephone Encounter (Signed)
Pt is requesting refill on Tramadol. No longer on med list.  Last OV: 02/13/2015 Last Fill: 07/15/2014 #60 and 0RF UDS: 11/02/2013 Low risk  Please advise.

## 2015-06-16 LAB — BASIC METABOLIC PANEL
BUN: 15 mg/dL (ref 4–21)
Creatinine: 1 mg/dL (ref 0.5–1.1)
Glucose: 93 mg/dL
Potassium: 4.5 mmol/L (ref 3.4–5.3)
Sodium: 140 mmol/L (ref 137–147)

## 2015-06-16 LAB — HEPATIC FUNCTION PANEL
ALK PHOS: 124 U/L (ref 25–125)
ALT: 9 U/L (ref 7–35)
AST: 16 U/L (ref 13–35)
Bilirubin, Total: 0.4 mg/dL

## 2015-06-16 LAB — LIPID PANEL
CHOLESTEROL: 206 mg/dL — AB (ref 0–200)
HDL: 57 mg/dL (ref 35–70)
LDL CALC: 132 mg/dL
TRIGLYCERIDES: 87 mg/dL (ref 40–160)

## 2015-06-16 LAB — HEMOGLOBIN A1C: HEMOGLOBIN A1C: 5.2

## 2015-06-16 LAB — TSH: TSH: 1.44 u[IU]/mL (ref 0.41–5.90)

## 2015-07-06 LAB — HM DEXA SCAN

## 2015-07-07 LAB — HM PAP SMEAR

## 2015-07-07 LAB — HM MAMMOGRAPHY: HM Mammogram: NORMAL (ref 0–4)

## 2015-07-10 ENCOUNTER — Encounter: Payer: Self-pay | Admitting: Internal Medicine

## 2015-07-19 ENCOUNTER — Encounter: Payer: Self-pay | Admitting: Internal Medicine

## 2015-08-15 ENCOUNTER — Other Ambulatory Visit: Payer: Self-pay

## 2015-08-15 MED ORDER — ALPRAZOLAM 0.5 MG PO TABS: ORAL_TABLET | ORAL | Status: DC

## 2015-08-15 NOTE — Telephone Encounter (Signed)
Rx printed and faxed to the pharmacy.  Confirmation received.//AB/CMA

## 2015-08-15 NOTE — Telephone Encounter (Signed)
Okay Xanax 0.5 mg one tablet twice a day as needed for anxiety or insomnia #30, no refills

## 2015-08-15 NOTE — Telephone Encounter (Signed)
[facility] (on [address]) request received via fax for the following medication:  Alprazolam 0.5 mg  Tab SIG:  Take one and one-half tablets by mouth once daily in the morning and take one and one-half tablets at bedtime as needed.  Rx Written date: 04/20/14 Last fill date: 09/27/14  Medication was no longer on patient's med list.  Called patient to verify that she was still taking medication.  She says that she does and takes it only as needed.  Last OV: 02/13/15 Next OV: 10/31/15  Moderate Risk  11/02/13 no controlled substance contract signed, uds sample given +Hydrocodone  Next Screen: 07/05/13  Please advise.

## 2015-08-15 NOTE — Telephone Encounter (Signed)
Rx printed and forwarded to provider for review and signature.

## 2015-10-04 IMAGING — RF DG C-ARM 61-120 MIN
1 series · 4 of 4 positions shown · non-contrast
Comparison: 10/17/2013.

CLINICAL DATA: Proximal humerus fracture.

EXAM:
DG C-ARM 61-120 MIN; RIGHT HUMERUS - 2+ VIEW

[Series 1: run · 4 of 4 slices shown]
[im 1/4]
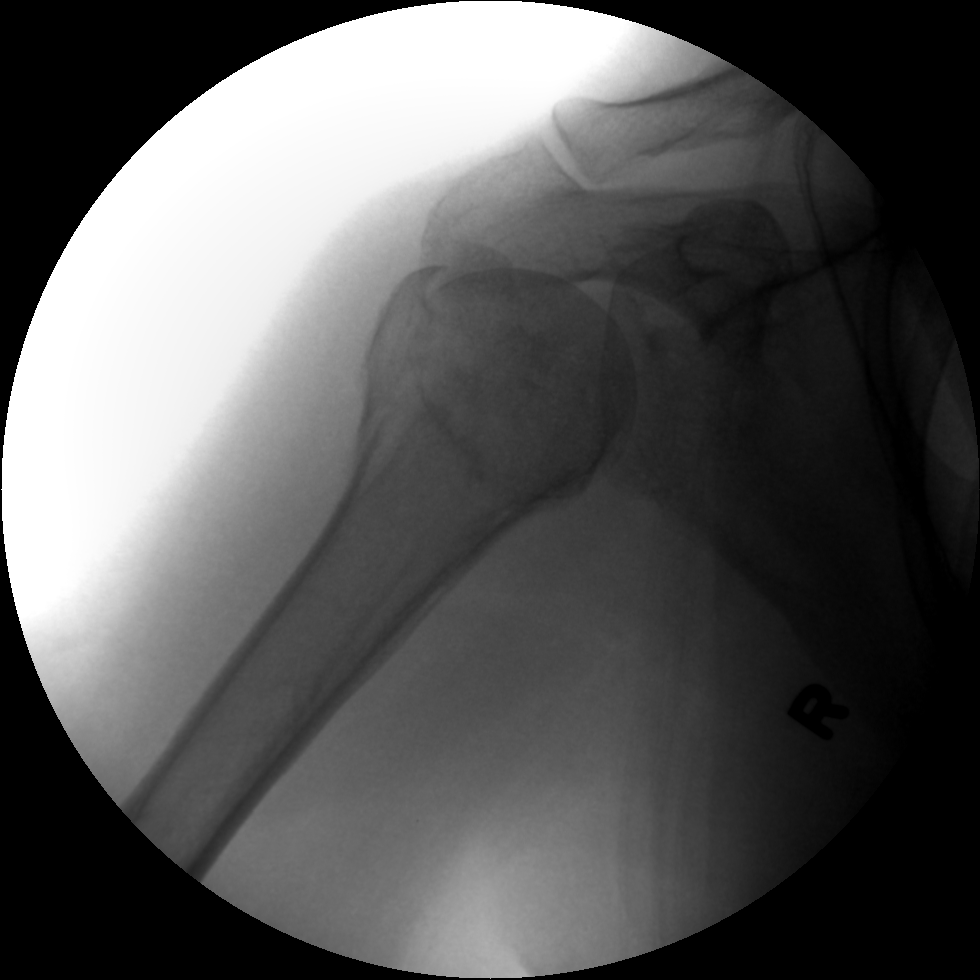
[im 2/4]
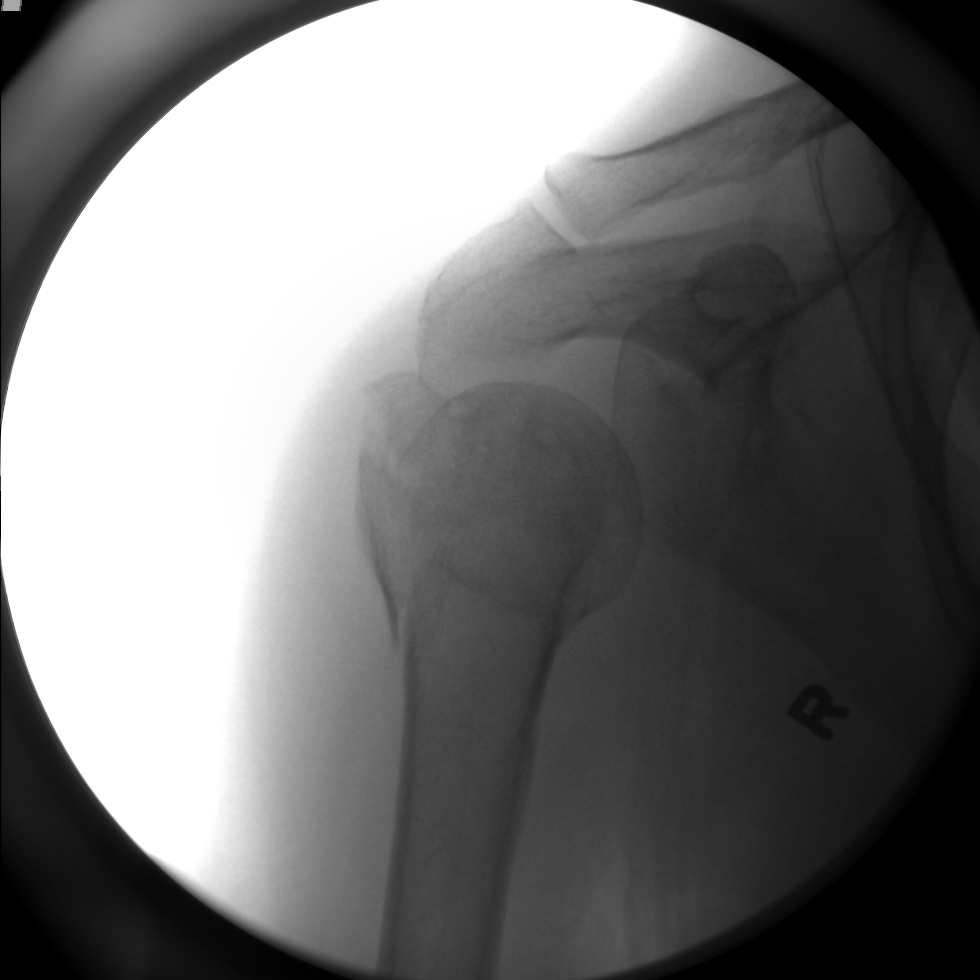
[im 3/4]
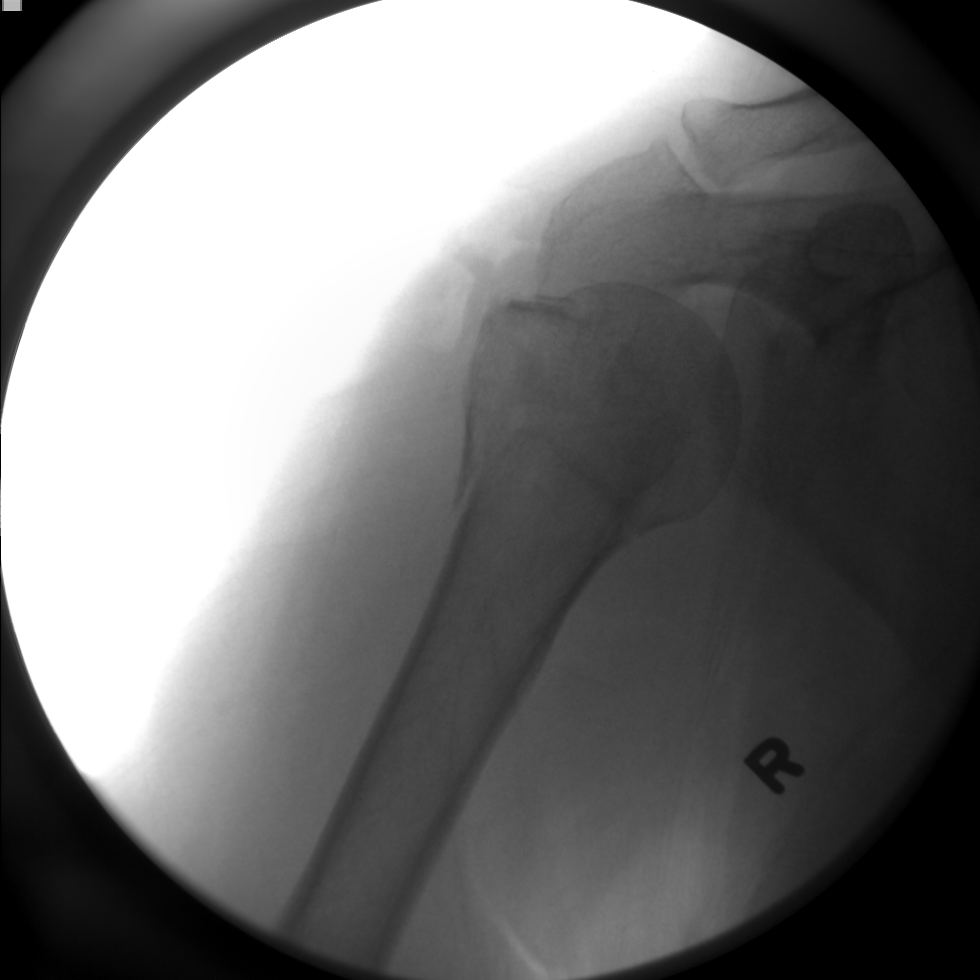
[im 4/4]
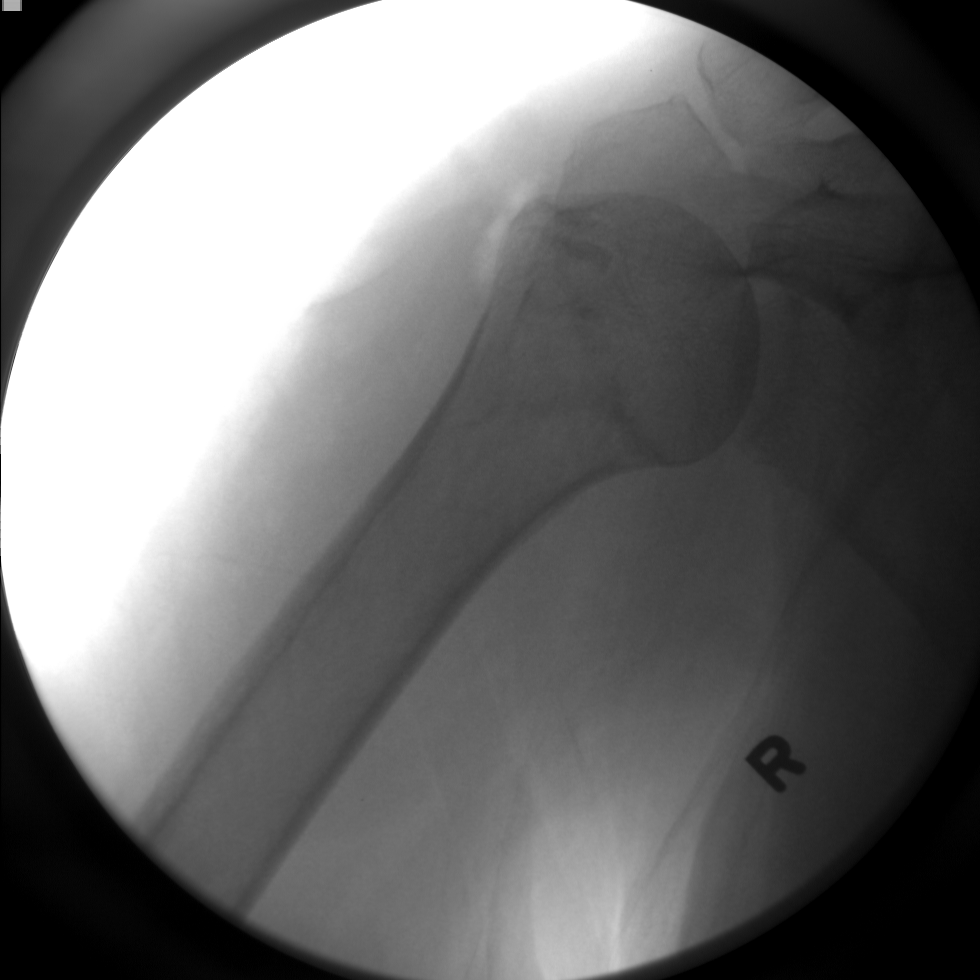

[4 of 4 positions shown; findings below may reference images not displayed]

FINDINGS: C-arm films document intraoperative visualization of a greater
tuberosity fracture.
IMPRESSION: As above.

## 2015-10-31 ENCOUNTER — Encounter: Payer: Self-pay | Admitting: Internal Medicine

## 2015-10-31 ENCOUNTER — Ambulatory Visit (INDEPENDENT_AMBULATORY_CARE_PROVIDER_SITE_OTHER): Payer: Commercial Managed Care - PPO | Admitting: Internal Medicine

## 2015-10-31 VITALS — BP 124/68 | HR 66 | Temp 98.0°F | Ht 65.0 in | Wt 217.1 lb

## 2015-10-31 DIAGNOSIS — Z09 Encounter for follow-up examination after completed treatment for conditions other than malignant neoplasm: Secondary | ICD-10-CM

## 2015-10-31 DIAGNOSIS — Z0000 Encounter for general adult medical examination without abnormal findings: Secondary | ICD-10-CM

## 2015-10-31 NOTE — Assessment & Plan Note (Signed)
Td 2010 ; Shingles immunization - will wait till 60 y/o Cscope neg 1-09   Female care: sees gynecology , UTD on MMG, PAPs per gyn Labs from March 2017: A1c 5.2, creatinine 1.0, sodium 140, potassium 4.5, AST ALT normal. Total cholesterol 206, TG 87, LDL 132. TSH 1.4 Hemoglobin 14.5 Hepatitis C negative Vitamin D 45 normal diet-exercise

## 2015-10-31 NOTE — Progress Notes (Signed)
Subjective:    Patient ID: Jessica Huang, female    DOB: [birth year], 60 y.o.   MRN: [id number]  DOS:  10/31/2015 Type of visit - description : CPX Interval history: Doing well, no major concerns   Review of Systems Constitutional: No fever. No chills. No unexplained wt changes. No unusual sweats  HEENT: No dental problems, no ear discharge, no facial swelling, no voice changes. No eye discharge, no eye  redness , no  intolerance to light   Respiratory: No wheezing , no  difficulty breathing. No cough , no mucus production  Cardiovascular: No CP, no leg swelling , no  Palpitations  GI: no nausea, no vomiting, no diarrhea , no  abdominal pain.  No blood in the stools. No dysphagia, no odynophagia    Endocrine: No polyphagia, no polyuria , no polydipsia  GU: No dysuria, gross hematuria, difficulty urinating. No urinary urgency, no frequency.  Musculoskeletal: No joint swellings or unusual aches or pains  Skin: No change in the color of the skin, palor , no  Rash  Allergic, immunologic: No environmental allergies , no  food allergies  Neurological: No dizziness no  syncope. No headaches. No diplopia, no slurred, no slurred speech, no motor deficits, no facial  Numbness  Hematological: No enlarged lymph nodes, no easy bruising , no unusual bleedings  Psychiatry: No suicidal ideas, no hallucinations, no beavior problems, no confusion.  No unusual/severe anxiety, no depression   Past Medical History  Diagnosis Date  . Anxiety and depression     takes Wellbutrin and Prozac daily  . Varicose veins   . Osteopenia 2004    Dexa, hip  . GERD (gastroesophageal reflux disease) 09-2012    dx by ENT   . Headache(784.0) last time at least a yr ago    migraine-occasionally  . Joint pain   . Joint swelling   . Back pain     buldging disc  . Anxiety     takes Xanax daily    Past Surgical History  Procedure Laterality Date  . Appendectomy    . Tonsillectomy    . Varicose  vein surgery  2014    laser   . Colonoscopy    . Orif shoulder fracture Right 11/18/2013    Procedure: OPEN REDUCTION INTERNAL FIXATION (ORIF) RIGHT SHOULDER TUBEROSITY FRACTURE;  Surgeon: Marin Shutter, MD;  Location: [facility];  Service: Orthopedics;  Laterality: Right;    Social History   Social History  . Marital Status: Divorced    Spouse Name: N/A  . Number of Children: 2  . Years of Education: N/A   Occupational History  . Private school teacher, 7th grade    Social History Main Topics  . Smoking status: Never Smoker   . Smokeless tobacco: Never Used  . Alcohol Use: 0.0 oz/week    0 Standard drinks or equivalent per week     Comment: socially   . Drug Use: No  . Sexual Activity: Not Currently    Birth Control/ Protection: Post-menopausal   Other Topics Concern  . Not on file   Social History Narrative   divorce, 2 children , one lives at home , daughter got married 10-2015     Family History  Problem Relation Age of Onset  . Other Mother     valve replacement--M  . Breast cancer Mother     M late 25s  . COPD Father     smoker  . Heart attack Neg Hx   .  Colon cancer Neg Hx   . Heart failure Mother     M  . Diabetes Other     GPs , late onset       Medication List       This list is accurate as of: 10/31/15 11:59 PM.  Always use your most recent med list.               ALPRAZolam 0.5 MG tablet  Commonly known as:  XANAX  Take one tablet by mouth twice a day as needed for anxiety or insomnia.     buPROPion 150 MG 24 hr tablet  Commonly known as:  WELLBUTRIN XL  Take 2 tablets (300 mg total) by mouth daily.     FLUoxetine 20 MG tablet  Commonly known as:  PROZAC  Take 1 tablet (20 mg total) by mouth daily.     fluticasone 50 MCG/ACT nasal spray  Commonly known as:  FLONASE  Place 2 sprays into both nostrils daily as needed for allergies or rhinitis. Reported on [id number]     folic acid 1 MG tablet  Commonly known as:  FOLVITE  Take 1 mg by  mouth daily.     traMADol 50 MG tablet  Commonly known as:  ULTRAM  Take 1 tablet (50 mg total) by mouth every 6 (six) hours as needed for moderate pain.           Objective:   Physical Exam BP 124/68 mmHg  Pulse 66  Temp(Src) 98 F (36.7 C) (Oral)  Ht 5\' 5"  (1.651 m)  Wt 217 lb 2 oz (98.487 kg)  BMI 36.13 kg/m2  SpO2 96%  General:   Well developed, well nourished . NAD.  Neck: No  thyromegaly  HEENT:  Normocephalic . Face symmetric, atraumatic Lungs:  CTA B Normal respiratory effort, no intercostal retractions, no accessory muscle use. Heart: RRR,  no murmur.  No pretibial edema bilaterally  Abdomen:  Not distended, soft, non-tender. No rebound or rigidity.   Skin: Exposed areas without rash. Not pale. Not jaundice Neurologic:  alert & oriented X3.  Speech normal, gait appropriate for age and unassisted Strength symmetric and appropriate for age.  Psych: Cognition and judgment appear intact.  Cooperative with normal attention span and concentration.  Behavior appropriate. No anxious or depressed appearing.    Assessment & Plan:   Assessment Anxiety depression Osteopenia, last dexa 06-2015 GERD DX by ENT 2014  PLAN Anxiety depression: Well controlled, check a UDS, contract signed, RF as needed On Ultram,  UDS and contract today. Call for RF as needed RTC one year

## 2015-10-31 NOTE — Progress Notes (Signed)
Pre visit review using our clinic review tool, if applicable. No additional management support is needed unless otherwise documented below in the visit note. 

## 2015-10-31 NOTE — Patient Instructions (Signed)
GO TO THE LAB : I did a urine sample for a UDS     GO TO THE FRONT DESK Schedule your next appointment for a  physical exam, fasting.  Call for refills as needed

## 2015-11-01 NOTE — Assessment & Plan Note (Signed)
Anxiety depression: Well controlled, check a UDS, contract signed, RF as needed On Ultram,  UDS and contract today. Call for RF as needed RTC one year

## 2015-11-08 ENCOUNTER — Telehealth: Payer: Self-pay | Admitting: Internal Medicine

## 2015-11-08 NOTE — Telephone Encounter (Signed)
Relation to [id number] Call back number: [phone]   Reason for call:  Patient confirmed with insurance shingle vaccination is covered requesting orders and schedule with nurse.

## 2015-11-08 NOTE — Telephone Encounter (Signed)
Immunization discussed w/ Pt at [facility] on 10/31/2015. Okay to schedule nurse visit at [facility]. Thank you.

## 2015-11-09 ENCOUNTER — Telehealth: Payer: Self-pay

## 2015-11-09 MED ORDER — TRAMADOL HCL 50 MG PO TABS: 50.0000 mg | ORAL_TABLET | Freq: Four times a day (QID) | ORAL | 0 refills | Status: DC | PRN

## 2015-11-09 NOTE — Telephone Encounter (Signed)
Rx printed, awaiting MD signature.  

## 2015-11-09 NOTE — Telephone Encounter (Signed)
LVM advising patient to schedule nurse appointment.

## 2015-11-09 NOTE — Telephone Encounter (Signed)
Appointment scheduled with nurse for 11/10/2015

## 2015-11-09 NOTE — Telephone Encounter (Signed)
Pt is requesting refill on Tramadol.   Last OV: 10/31/2015 Last Fill: 02/27/2015 #30 and 0RF UDS: 10/31/2015 Pending    Please advise.

## 2015-11-09 NOTE — Telephone Encounter (Signed)
Ok #30, no RF 

## 2015-11-09 NOTE — Telephone Encounter (Signed)
Rx faxed to [facility] pharmacy  

## 2015-11-10 ENCOUNTER — Ambulatory Visit (INDEPENDENT_AMBULATORY_CARE_PROVIDER_SITE_OTHER): Payer: Commercial Managed Care - PPO

## 2015-11-10 DIAGNOSIS — Z23 Encounter for immunization: Secondary | ICD-10-CM

## 2015-11-10 NOTE — Telephone Encounter (Signed)
Noted. Thanks.

## 2015-11-10 NOTE — Progress Notes (Signed)
Pre visit review using our clinic tool,if applicable. No additional management support is needed unless otherwise documented below in the visit note.   Patient in for Zoster vaccine. Given Left Deltoid. Patient tolerated well.

## 2015-11-14 ENCOUNTER — Telehealth: Payer: Self-pay

## 2015-11-14 NOTE — Telephone Encounter (Signed)
UDS: 10/31/2015  Positive for Alprazolam   Low risk per Dr. Larose Kells 11/13/2015

## 2015-12-30 ENCOUNTER — Other Ambulatory Visit: Payer: Self-pay | Admitting: Internal Medicine

## 2016-01-01 NOTE — Telephone Encounter (Signed)
Pt is requesting refill on Alprazolam.  Last OV: 10/31/2015  Last Fill: 08/15/2015 #30 and 0RF UDS: 10/31/2015 Low risk  Please advise.

## 2016-01-01 NOTE — Telephone Encounter (Signed)
Rx printed, awaiting MD signature.  

## 2016-01-01 NOTE — Telephone Encounter (Signed)
Ok 30 and 2 RF 

## 2016-01-01 NOTE — Telephone Encounter (Signed)
Rx faxed to [facility] pharmacy  

## 2016-03-01 ENCOUNTER — Other Ambulatory Visit: Payer: Self-pay | Admitting: Internal Medicine

## 2016-03-24 ENCOUNTER — Other Ambulatory Visit: Payer: Self-pay | Admitting: Internal Medicine

## 2016-03-26 ENCOUNTER — Ambulatory Visit: Payer: Commercial Managed Care - PPO | Admitting: Internal Medicine

## 2016-04-01 ENCOUNTER — Other Ambulatory Visit: Payer: Self-pay | Admitting: Internal Medicine

## 2016-04-01 NOTE — Telephone Encounter (Signed)
Rx faxed to [facility] pharmacy  

## 2016-04-01 NOTE — Telephone Encounter (Signed)
Ok 30 and 0 RF

## 2016-04-01 NOTE — Telephone Encounter (Signed)
Pt is requesting refill on Tramadol.  Last OV: 10/31/2015 Last Fill: 11/09/2015 #30 and 0RF UDS: 10/31/2015 Low risk  Please advise.

## 2016-04-01 NOTE — Telephone Encounter (Signed)
Rx printed, awaiting MD signature.  

## 2016-06-06 DIAGNOSIS — M5386 Other specified dorsopathies, lumbar region: Secondary | ICD-10-CM | POA: Diagnosis not present

## 2016-06-06 DIAGNOSIS — M9902 Segmental and somatic dysfunction of thoracic region: Secondary | ICD-10-CM | POA: Diagnosis not present

## 2016-06-06 DIAGNOSIS — M9903 Segmental and somatic dysfunction of lumbar region: Secondary | ICD-10-CM | POA: Diagnosis not present

## 2016-07-04 DIAGNOSIS — Z131 Encounter for screening for diabetes mellitus: Secondary | ICD-10-CM | POA: Diagnosis not present

## 2016-07-04 DIAGNOSIS — Z1231 Encounter for screening mammogram for malignant neoplasm of breast: Secondary | ICD-10-CM | POA: Diagnosis not present

## 2016-07-04 DIAGNOSIS — N870 Mild cervical dysplasia: Secondary | ICD-10-CM | POA: Diagnosis not present

## 2016-07-04 DIAGNOSIS — Z1329 Encounter for screening for other suspected endocrine disorder: Secondary | ICD-10-CM | POA: Diagnosis not present

## 2016-07-04 DIAGNOSIS — Z130 Encounter for screening for diseases of the blood and blood-forming organs and certain disorders involving the immune mechanism: Secondary | ICD-10-CM | POA: Diagnosis not present

## 2016-07-04 DIAGNOSIS — Z01419 Encounter for gynecological examination (general) (routine) without abnormal findings: Secondary | ICD-10-CM | POA: Diagnosis not present

## 2016-07-04 DIAGNOSIS — Z13220 Encounter for screening for lipoid disorders: Secondary | ICD-10-CM | POA: Diagnosis not present

## 2016-07-04 LAB — LIPID PANEL
Cholesterol: 219 mg/dL — AB (ref 0–200)
HDL: 55 mg/dL (ref 35–70)
LDL Cholesterol: 147 mg/dL
TRIGLYCERIDES: 84 mg/dL (ref 40–160)

## 2016-07-04 LAB — BASIC METABOLIC PANEL
BUN: 17 mg/dL (ref 4–21)
CREATININE: 1.2 mg/dL — AB (ref 0.5–1.1)
Glucose: 93 mg/dL
POTASSIUM: 4.8 mmol/L (ref 3.4–5.3)
SODIUM: 141 mmol/L (ref 137–147)

## 2016-07-04 LAB — VITAMIN D 25 HYDROXY (VIT D DEFICIENCY, FRACTURES)
VIT D 25 HYDROXY: 45.2
VIT D 25 HYDROXY: 45.2

## 2016-07-04 LAB — HEPATIC FUNCTION PANEL
ALT: 10 U/L (ref 7–35)
AST: 19 U/L (ref 13–35)
Alkaline Phosphatase: 127 U/L — AB (ref 25–125)
Bilirubin, Total: 0.3 mg/dL

## 2016-07-04 LAB — CBC AND DIFFERENTIAL
HEMATOCRIT: 46 % (ref 36–46)
HEMOGLOBIN: 14.9 g/dL (ref 12.0–16.0)
Platelets: 336 10*3/uL (ref 150–399)
WBC: 6.4 10^3/mL

## 2016-07-04 LAB — TSH: TSH: 1.56 u[IU]/mL (ref 0.41–5.90)

## 2016-07-04 LAB — HEMOGLOBIN A1C: HEMOGLOBIN A1C: 51

## 2016-07-09 DIAGNOSIS — I83893 Varicose veins of bilateral lower extremities with other complications: Secondary | ICD-10-CM | POA: Diagnosis not present

## 2016-07-09 DIAGNOSIS — I83813 Varicose veins of bilateral lower extremities with pain: Secondary | ICD-10-CM | POA: Diagnosis not present

## 2016-07-11 DIAGNOSIS — I8312 Varicose veins of left lower extremity with inflammation: Secondary | ICD-10-CM | POA: Diagnosis not present

## 2016-07-11 DIAGNOSIS — I83893 Varicose veins of bilateral lower extremities with other complications: Secondary | ICD-10-CM | POA: Diagnosis not present

## 2016-07-11 DIAGNOSIS — I8311 Varicose veins of right lower extremity with inflammation: Secondary | ICD-10-CM | POA: Diagnosis not present

## 2016-08-01 ENCOUNTER — Encounter: Payer: Self-pay | Admitting: Internal Medicine

## 2016-08-14 DIAGNOSIS — L821 Other seborrheic keratosis: Secondary | ICD-10-CM | POA: Diagnosis not present

## 2016-08-14 DIAGNOSIS — D485 Neoplasm of uncertain behavior of skin: Secondary | ICD-10-CM | POA: Diagnosis not present

## 2016-08-14 DIAGNOSIS — D225 Melanocytic nevi of trunk: Secondary | ICD-10-CM | POA: Diagnosis not present

## 2016-08-23 ENCOUNTER — Other Ambulatory Visit: Payer: Self-pay | Admitting: Internal Medicine

## 2016-09-10 DIAGNOSIS — I8312 Varicose veins of left lower extremity with inflammation: Secondary | ICD-10-CM | POA: Diagnosis not present

## 2016-09-10 DIAGNOSIS — I83892 Varicose veins of left lower extremities with other complications: Secondary | ICD-10-CM | POA: Diagnosis not present

## 2016-09-23 DIAGNOSIS — M5386 Other specified dorsopathies, lumbar region: Secondary | ICD-10-CM | POA: Diagnosis not present

## 2016-09-23 DIAGNOSIS — M9903 Segmental and somatic dysfunction of lumbar region: Secondary | ICD-10-CM | POA: Diagnosis not present

## 2016-09-23 DIAGNOSIS — M9905 Segmental and somatic dysfunction of pelvic region: Secondary | ICD-10-CM | POA: Diagnosis not present

## 2016-10-02 DIAGNOSIS — I8312 Varicose veins of left lower extremity with inflammation: Secondary | ICD-10-CM | POA: Diagnosis not present

## 2016-10-04 ENCOUNTER — Other Ambulatory Visit: Payer: Self-pay | Admitting: Internal Medicine

## 2016-10-04 ENCOUNTER — Telehealth: Payer: Self-pay | Admitting: Internal Medicine

## 2016-10-04 MED ORDER — ALPRAZOLAM 0.5 MG PO TABS: 0.5000 mg | ORAL_TABLET | Freq: Two times a day (BID) | ORAL | 0 refills | Status: DC | PRN

## 2016-10-04 NOTE — Telephone Encounter (Signed)
Caller name: Relation to UD:APTC Call back [place]: [facility]  Reason for call: pt states she only has a few tablets left of her rx ALPRAZolam (XANAX) 0.5 MG tablet , pharmacy informed her that she needs an appt before the rx can be filled. Pt states she has an appt scheduled for 11/08/16, pt would like to know if Dr. Larose Kells can give her enough to last her until her appointment. Pt states to leave her a message if she is not available to answer.

## 2016-10-04 NOTE — Telephone Encounter (Signed)
Pt is requesting refill on alprazolam 0.5mg . Paz Pt.  Last OV: 10/31/2015, cpe scheduled 11/08/2016 Last Fill: 01/01/2016 #30 and 3RF UDS: 10/31/2015 Low risk  Please advise.

## 2016-10-04 NOTE — Telephone Encounter (Signed)
Rx faxed to [facility] pharmacy  

## 2016-10-04 NOTE — Telephone Encounter (Signed)
Refill x1 only

## 2016-10-04 NOTE — Telephone Encounter (Signed)
Rx printed, awaiting DO signature.  

## 2016-11-01 ENCOUNTER — Encounter: Payer: Commercial Managed Care - PPO | Admitting: Internal Medicine

## 2016-11-04 DIAGNOSIS — I83811 Varicose veins of right lower extremities with pain: Secondary | ICD-10-CM | POA: Diagnosis not present

## 2016-11-04 DIAGNOSIS — I8311 Varicose veins of right lower extremity with inflammation: Secondary | ICD-10-CM | POA: Diagnosis not present

## 2016-11-08 ENCOUNTER — Encounter: Payer: Self-pay | Admitting: Internal Medicine

## 2016-11-08 ENCOUNTER — Ambulatory Visit (INDEPENDENT_AMBULATORY_CARE_PROVIDER_SITE_OTHER): Payer: Commercial Managed Care - PPO | Admitting: Internal Medicine

## 2016-11-08 VITALS — BP 140/84 | HR 71 | Temp 98.0°F | Ht 65.0 in | Wt 220.2 lb

## 2016-11-08 DIAGNOSIS — Z0000 Encounter for general adult medical examination without abnormal findings: Secondary | ICD-10-CM | POA: Diagnosis not present

## 2016-11-08 DIAGNOSIS — Z114 Encounter for screening for human immunodeficiency virus [HIV]: Secondary | ICD-10-CM

## 2016-11-08 LAB — HIV ANTIBODY (ROUTINE TESTING W REFLEX): HIV: NONREACTIVE

## 2016-11-08 MED ORDER — TRAMADOL HCL 50 MG PO TABS: 50.0000 mg | ORAL_TABLET | Freq: Four times a day (QID) | ORAL | 0 refills | Status: DC | PRN

## 2016-11-08 MED ORDER — BUPROPION HCL ER (XL) 150 MG PO TB24: 300.0000 mg | ORAL_TABLET | Freq: Every day | ORAL | 5 refills | Status: DC

## 2016-11-08 MED ORDER — FLUOXETINE HCL 20 MG PO TABS: 30.0000 mg | ORAL_TABLET | Freq: Every day | ORAL | 6 refills | Status: DC

## 2016-11-08 NOTE — Patient Instructions (Signed)
GO TO THE LAB : Get the blood work  . Also provide a urine sample for a UDS   GO TO THE FRONT DESK Schedule your next appointment for a  checkup in 4 months   Increase your fluoxetine to 30 mg daily (1.5 tablets a day)

## 2016-11-08 NOTE — Assessment & Plan Note (Addendum)
-  Td 2010 ; zostavax 2017; shingrex discussed, rec to wait till 61 y/o  --Cscope neg 1-09 -Female care: sees gynecology regularly; MMG-DEXA per gyn -Labs from 07-2016 @ gyn: Vitamin D, TSH, CMP normal. A1c 5.1. LDL 147, total cholesterol 219. Slightly high. We'll get a HIV test per guidelines -diet-exercise : discussed

## 2016-11-08 NOTE — Progress Notes (Signed)
Subjective:    Patient ID: Jessica Huang, female    DOB: 18-Nov-1955, 61 y.o.   MRN: [id number]  DOS:  11/08/2016 Type of visit - description : cpx Interval history: In general feeling well. Has right-sided hip pain, mostly with walking. Takes Aleve most days and occasionally uses ice; feels satisfied with treatment. Very seldom use Ultram. Depression well-controlled Anxiety: Still has a sporadic symptoms in the morning. Treatment options?   Review of Systems   Other than above, a 14 point review of systems is negative      Past Medical History:  Diagnosis Date  . Anxiety    takes Xanax daily  . Anxiety and depression    takes Wellbutrin and Prozac daily  . Back pain    buldging disc  . GERD (gastroesophageal reflux disease) 09-2012   dx by ENT   . Headache(784.0) last time at least a yr ago   migraine-occasionally  . Joint pain   . Joint swelling   . Osteopenia 2004   Dexa, hip  . Varicose veins     Past Surgical History:  Procedure Laterality Date  . APPENDECTOMY    . COLONOSCOPY    . ORIF SHOULDER FRACTURE Right 11/18/2013   Procedure: OPEN REDUCTION INTERNAL FIXATION (ORIF) RIGHT SHOULDER TUBEROSITY FRACTURE;  Surgeon: Marin Shutter, MD;  Location: [facility] [place];  Service: Orthopedics;  Laterality: Right;  . TONSILLECTOMY    . VARICOSE VEIN SURGERY  2014   laser     Social History   Social History  . Marital status: Divorced    Spouse name: N/A  . Number of children: 2  . Years of education: N/A   Occupational History  . Private school teacher, 7th grade    Social History Main Topics  . Smoking status: Never Smoker  . Smokeless tobacco: Never Used  . Alcohol use 0.0 oz/week     Comment: socially   . Drug use: No  . Sexual activity: Not Currently    Birth control/ protection: Post-menopausal   Other Topics Concern  . Not on file   Social History Narrative   divorce, 2 children , one lives at home , daughter got married 10-2015     Family History    Problem Relation Age of Onset  . Other Mother        valve replacement--M  . Breast cancer Mother        M late 79s  . Heart failure Mother        M  . COPD Father        smoker  . Diabetes Other        GPs , late onset  . Heart attack Neg Hx   . Colon cancer Neg Hx      Allergies as of 11/08/2016      Reactions   Penicillins Rash   CHILDHOOD ALLERGY      Medication List       Accurate as of 11/08/16 11:59 PM. Always use your most recent med list.          ALPRAZolam 0.5 MG tablet Commonly known as:  XANAX Take 1 tablet (0.5 mg total) by mouth 2 (two) times daily as needed for anxiety or sleep.   buPROPion 150 MG 24 hr tablet Commonly known as:  WELLBUTRIN XL Take 2 tablets (300 mg total) by mouth daily.   CALCIUM 1000 + D PO Take 1,000 mg by mouth daily.   cetirizine 10 MG tablet  Commonly known as:  ZYRTEC Take 10 mg by mouth daily.   FLUoxetine 20 MG tablet Commonly known as:  PROZAC Take 1.5 tablets (30 mg total) by mouth daily.   fluticasone 50 MCG/ACT nasal spray Commonly known as:  FLONASE Place 2 sprays into both nostrils daily as needed for allergies or rhinitis. Reported on 04/25/7354   folic acid 1 MG tablet Commonly known as:  FOLVITE Take 1 mg by mouth daily.   MULTIVITAMIN ADULT PO Take 1 tablet by mouth daily.   traMADol 50 MG tablet Commonly known as:  ULTRAM Take 1 tablet (50 mg total) by mouth every 6 (six) hours as needed for moderate pain.          Objective:   Physical Exam BP 140/84 (BP Location: Left Arm, Patient Position: Sitting, Cuff Size: Normal)   Pulse 71   Temp 98 F (36.7 C) (Oral)   Ht 5\' 5"  (1.651 m)   Wt 220 lb 4 oz (99.9 kg)   SpO2 98%   BMI 36.65 kg/m  General:   Well developed, well nourished . NAD.  HEENT:  Normocephalic . Face symmetric, atraumatic Neck: No thyromegaly Lungs:  CTA B Normal respiratory effort, no intercostal retractions, no accessory muscle use. Heart: RRR,  no murmur.  no  pretibial edema bilaterally  Abdomen:  Not distended, soft, non-tender. No rebound or rigidity.  Skin: Not pale. Not jaundice MSK: No TTP at the trochanteric bursa, low back or SI joints. Hip rotations normal. Neurologic:  alert & oriented X3.  Speech normal, gait appropriate for age and unassisted Psych--  Cognition and judgment appear intact.  Cooperative with normal attention span and concentration.  Behavior appropriate. No anxious or depressed appearing.    Assessment & Plan:   Assessment Anxiety depression Osteopenia, last dexa 06-2015 MSK: Back pain, hip pain GERD DX by ENT 2014  PLAN Anxiety depression: Needs a slightly better control of anxiety early in the morning. Options discussed, elected to increase fluoxetine to 30 mg. I also encourage yoga-meditation; okay to use Xanax as needed. RTC 4 months MSK: Right hip pain, occasional back pain. Currently handling sx well w/ PRN Aleve, ice. Has ultram just in case UDS and contract today because takes Xanax and Ultram. RTC 4 months. Marland Kitchen

## 2016-11-08 NOTE — Progress Notes (Signed)
Pre visit review using our clinic review tool, if applicable. No additional management support is needed unless otherwise documented below in the visit note. 

## 2016-11-09 NOTE — Assessment & Plan Note (Signed)
Anxiety depression: Needs a slightly better control of anxiety early in the morning. Options discussed, elected to increase fluoxetine to 30 mg. I also encourage yoga-meditation; okay to use Xanax as needed. RTC 4 months MSK: Right hip pain, occasional back pain. Currently handling sx well w/ PRN Aleve, ice. Has ultram just in case UDS and contract today because takes Xanax and Ultram. RTC 4 months. Marland Kitchen

## 2016-11-25 ENCOUNTER — Telehealth: Payer: Self-pay

## 2016-11-25 NOTE — Telephone Encounter (Signed)
UDS: 11/08/2016  Alprazolam: Not detected: PRN Tramadol: detected   Low risk per PCP 11/25/2016

## 2016-11-27 DIAGNOSIS — I83811 Varicose veins of right lower extremities with pain: Secondary | ICD-10-CM | POA: Diagnosis not present

## 2016-11-27 DIAGNOSIS — I8311 Varicose veins of right lower extremity with inflammation: Secondary | ICD-10-CM | POA: Diagnosis not present

## 2016-12-09 DIAGNOSIS — I8312 Varicose veins of left lower extremity with inflammation: Secondary | ICD-10-CM | POA: Diagnosis not present

## 2016-12-17 DIAGNOSIS — J019 Acute sinusitis, unspecified: Secondary | ICD-10-CM | POA: Diagnosis not present

## 2016-12-22 DIAGNOSIS — J329 Chronic sinusitis, unspecified: Secondary | ICD-10-CM | POA: Diagnosis not present

## 2017-01-01 ENCOUNTER — Other Ambulatory Visit: Payer: Self-pay | Admitting: Family Medicine

## 2017-01-01 NOTE — Telephone Encounter (Signed)
Requesting: Alprazolam Contract:Yes UDS: 7.26.18 low risk next screen 1.18.19 Last OV: 7.27.18 Next OV: 11.26.18 Last Refill: 6.22.18   Please advise

## 2017-01-01 NOTE — Telephone Encounter (Signed)
Okay 30 and 2 refills 

## 2017-01-01 NOTE — Telephone Encounter (Signed)
Rx faxed to [facility] pharmacy  

## 2017-01-08 DIAGNOSIS — I8311 Varicose veins of right lower extremity with inflammation: Secondary | ICD-10-CM | POA: Diagnosis not present

## 2017-01-08 DIAGNOSIS — I83891 Varicose veins of right lower extremities with other complications: Secondary | ICD-10-CM | POA: Diagnosis not present

## 2017-01-22 DIAGNOSIS — I8312 Varicose veins of left lower extremity with inflammation: Secondary | ICD-10-CM | POA: Diagnosis not present

## 2017-02-06 DIAGNOSIS — I8311 Varicose veins of right lower extremity with inflammation: Secondary | ICD-10-CM | POA: Diagnosis not present

## 2017-02-20 DIAGNOSIS — M9901 Segmental and somatic dysfunction of cervical region: Secondary | ICD-10-CM | POA: Diagnosis not present

## 2017-02-20 DIAGNOSIS — M5414 Radiculopathy, thoracic region: Secondary | ICD-10-CM | POA: Diagnosis not present

## 2017-02-20 DIAGNOSIS — M9902 Segmental and somatic dysfunction of thoracic region: Secondary | ICD-10-CM | POA: Diagnosis not present

## 2017-03-10 ENCOUNTER — Encounter: Payer: Self-pay | Admitting: Internal Medicine

## 2017-03-10 ENCOUNTER — Ambulatory Visit: Payer: Commercial Managed Care - PPO | Admitting: Internal Medicine

## 2017-03-10 VITALS — BP 132/68 | HR 70 | Temp 97.9°F | Resp 14 | Ht 65.0 in | Wt 225.1 lb

## 2017-03-10 DIAGNOSIS — F419 Anxiety disorder, unspecified: Secondary | ICD-10-CM

## 2017-03-10 DIAGNOSIS — F329 Major depressive disorder, single episode, unspecified: Secondary | ICD-10-CM

## 2017-03-10 DIAGNOSIS — F32A Depression, unspecified: Secondary | ICD-10-CM

## 2017-03-10 MED ORDER — FLUOXETINE HCL 20 MG PO TABS: 40.0000 mg | ORAL_TABLET | Freq: Every day | ORAL | 3 refills | Status: DC

## 2017-03-10 NOTE — Assessment & Plan Note (Signed)
Anxiety-Depression: long h/o such problems, on  Wellbutrin and fluoxetine for years, still has residual anxiety in the morning.  No depression at all, the major trigger for depression was her divorce years ago.  Options include increase fluoxetine, psychotherapy, no change or actually decrease Wellbutrin and increase fluoxetine. We agreed on decrease Wellbutrin 300 mg to 150 mg qd  and increase fluoxetine to 40 mg.  Continue with occasional Xanax and  reassess in a few weeks.  If this approach is not working, we can always go back on Wellbutrin 300 mg or continue increasing fluoxetine to 60 mg. RTC 6-8 w

## 2017-03-10 NOTE — Patient Instructions (Signed)
Next visit in 6-8 weeks  Decrease Wellbutrin to 1 tablet daily  Increase fluoxetine to 2 tablets daily

## 2017-03-10 NOTE — Progress Notes (Signed)
Pre visit review using our clinic review tool, if applicable. No additional management support is needed unless otherwise documented below in the visit note. 

## 2017-03-10 NOTE — Progress Notes (Signed)
Subjective:    Patient ID: Jessica Huang, female    DOB: [birth year], 61 y.o.   MRN: [id number]  DOS:  03/10/2017 Type of visit - description : rov Interval history: See last visit, here for follow-up. Due to anxiety, fluoxetine dose  was slightly increased, some improvement, still has sxs in the mornings. Alternatives?   Review of Systems No depression at all Sleeps well No major stressors , doing well at work, her family life is good.  Past Medical History:  Diagnosis Date  . Anxiety    takes Xanax daily  . Anxiety and depression    takes Wellbutrin and Prozac daily  . Back pain    buldging disc  . GERD (gastroesophageal reflux disease) 09-2012   dx by ENT   . Headache(784.0) last time at least a yr ago   migraine-occasionally  . Joint pain   . Joint swelling   . Osteopenia 2004   Dexa, hip  . Varicose veins     Past Surgical History:  Procedure Laterality Date  . APPENDECTOMY    . COLONOSCOPY    . ORIF SHOULDER FRACTURE Right 11/18/2013   Procedure: OPEN REDUCTION INTERNAL FIXATION (ORIF) RIGHT SHOULDER TUBEROSITY FRACTURE;  Surgeon: Marin Shutter, MD;  Location: [facility];  Service: Orthopedics;  Laterality: Right;  . TONSILLECTOMY    . VARICOSE VEIN SURGERY  2014   laser     Social History   Socioeconomic History  . Marital status: Divorced    Spouse name: Not on file  . Number of children: 2  . Years of education: Not on file  . Highest education level: Not on file  Social Needs  . Financial resource strain: Not on file  . Food insecurity - worry: Not on file  . Food insecurity - inability: Not on file  . Transportation needs - medical: Not on file  . Transportation needs - non-medical: Not on file  Occupational History  . Occupation: Civil engineer, contracting, 7th grade  Tobacco Use  . Smoking status: Never Smoker  . Smokeless tobacco: Never Used  Substance and Sexual Activity  . Alcohol use: Yes    Alcohol/week: 0.0 oz    Comment: socially   .  Drug use: No  . Sexual activity: Not Currently    Birth control/protection: Post-menopausal  Other Topics Concern  . Not on file  Social History Narrative   divorce, 2 children , one lives at home , daughter got married 10-2015      Allergies as of 03/10/2017      Reactions   Penicillins Rash   CHILDHOOD ALLERGY      Medication List        Accurate as of 03/10/17  6:34 PM. Always use your most recent med list.          ALPRAZolam 0.5 MG tablet Commonly known as:  XANAX Take 1 tablet (0.5 mg total) by mouth 2 (two) times daily as needed for anxiety or sleep.   buPROPion 150 MG 24 hr tablet Commonly known as:  WELLBUTRIN XL Take 1 tablet (150 mg total) by mouth daily.   CALCIUM 1000 + D PO Take 1,000 mg by mouth daily.   cetirizine 10 MG tablet Commonly known as:  ZYRTEC Take 10 mg by mouth daily.   FLUoxetine 20 MG tablet Commonly known as:  PROZAC Take 2 tablets (40 mg total) by mouth daily.   fluticasone 50 MCG/ACT nasal spray Commonly known as:  FLONASE Place 2  sprays into both nostrils daily as needed for allergies or rhinitis. Reported on 4/96/7591   folic acid 1 MG tablet Commonly known as:  FOLVITE Take 1 mg by mouth daily.   MULTIVITAMIN ADULT PO Take 1 tablet by mouth daily.   traMADol 50 MG tablet Commonly known as:  ULTRAM Take 1 tablet (50 mg total) by mouth every 6 (six) hours as needed for moderate pain.          Objective:   Physical Exam BP 132/68 (BP Location: Left Arm, Patient Position: Sitting, Cuff Size: Small)   Pulse 70   Temp 97.9 F (36.6 C) (Oral)   Resp 14   Ht 5\' 5"  (1.651 m)   Wt 225 lb 2 oz (102.1 kg)   SpO2 96%   BMI 37.46 kg/m  General:   Well developed, well nourished . NAD.  HEENT:  Normocephalic . Face symmetric, atraumatic  Skin: Not pale. Not jaundice Neurologic:  alert & oriented X3.  Speech normal, gait appropriate for age and unassisted Psych--  Cognition and judgment appear intact.  Cooperative  with normal attention span and concentration.  Behavior appropriate. No anxious or depressed appearing.      Assessment & Plan:    Assessment Anxiety depression Osteopenia, last dexa 06-2015 MSK: Back pain, hip pain GERD DX by ENT 2014  PLAN Anxiety-Depression: long h/o such problems, on  Wellbutrin and fluoxetine for years, still has residual anxiety in the morning.  No depression at all, the major trigger for depression was her divorce years ago.  Options include increase fluoxetine, psychotherapy, no change or actually decrease Wellbutrin and increase fluoxetine. We agreed on decrease Wellbutrin 300 mg to 150 mg qd  and increase fluoxetine to 40 mg.  Continue with occasional Xanax and  reassess in a few weeks.  If this approach is not working, we can always go back on Wellbutrin 300 mg or continue increasing fluoxetine to 60 mg. RTC 6-8 w

## 2017-03-22 DIAGNOSIS — J0100 Acute maxillary sinusitis, unspecified: Secondary | ICD-10-CM | POA: Diagnosis not present

## 2017-04-14 ENCOUNTER — Telehealth: Payer: Self-pay

## 2017-04-14 MED ORDER — TRAMADOL HCL 50 MG PO TABS: 50.0000 mg | ORAL_TABLET | Freq: Four times a day (QID) | ORAL | 1 refills | Status: DC | PRN

## 2017-04-14 NOTE — Telephone Encounter (Signed)
sent 

## 2017-04-14 NOTE — Telephone Encounter (Signed)
Pt is requesting refill on tramadol.   Last OV: 03/10/2017 Last Fill: 11/08/2016 #30 and 0RF UDS: 11/08/2016 Low risk  Please advise.

## 2017-04-16 ENCOUNTER — Ambulatory Visit: Payer: Commercial Managed Care - PPO | Admitting: Internal Medicine

## 2017-04-16 ENCOUNTER — Encounter: Payer: Self-pay | Admitting: Internal Medicine

## 2017-04-16 VITALS — BP 128/80 | HR 82 | Temp 98.2°F | Resp 14 | Ht 65.0 in | Wt 227.4 lb

## 2017-04-16 DIAGNOSIS — F419 Anxiety disorder, unspecified: Secondary | ICD-10-CM | POA: Diagnosis not present

## 2017-04-16 DIAGNOSIS — F329 Major depressive disorder, single episode, unspecified: Secondary | ICD-10-CM

## 2017-04-16 DIAGNOSIS — F32A Depression, unspecified: Secondary | ICD-10-CM

## 2017-04-16 NOTE — Assessment & Plan Note (Signed)
Anxiety, depression: See last visit, Wellbutrin decrease and fluoxetine increased, she is doing very well, good compliance, no apparent s/e.   No suicidal ideas. I encouraged her to continue to be physically active and engage socially. Call for refills when needed. RTC 6 months.

## 2017-04-16 NOTE — Progress Notes (Signed)
Pre visit review using our clinic review tool, if applicable. No additional management support is needed unless otherwise documented below in the visit note. 

## 2017-04-16 NOTE — Patient Instructions (Signed)
  GO TO THE FRONT DESK Schedule your next appointment for a  Physical exam in 6 months, fasting     

## 2017-04-16 NOTE — Progress Notes (Signed)
Subjective:    Patient ID: Jessica Huang, female    DOB: 11-16-1955, 62 y.o.   MRN: [id number]  DOS:  04/16/2017 Type of visit - description : f/u Interval history: See last visit, had anxiety, medications were adjusted. She tolerated the change well, no apparent side effects.  Symptoms have definitely improved.   Review of Systems Sleeps well Denies suicidal ideas She remains socially engaged, has good relationships at work, is seen a gentleman on and off.   Past Medical History:  Diagnosis Date  . Anxiety    takes Xanax daily  . Anxiety and depression    takes Wellbutrin and Prozac daily  . Back pain    buldging disc  . GERD (gastroesophageal reflux disease) 09-2012   dx by ENT   . Headache(784.0) last time at least a yr ago   migraine-occasionally  . Joint pain   . Joint swelling   . Osteopenia 2004   Dexa, hip  . Varicose veins     Past Surgical History:  Procedure Laterality Date  . APPENDECTOMY    . COLONOSCOPY    . ORIF SHOULDER FRACTURE Right 11/18/2013   Procedure: OPEN REDUCTION INTERNAL FIXATION (ORIF) RIGHT SHOULDER TUBEROSITY FRACTURE;  Surgeon: Marin Shutter, MD;  Location: [facility];  Service: Orthopedics;  Laterality: Right;  . TONSILLECTOMY    . VARICOSE VEIN SURGERY  2014   laser     Social History   Socioeconomic History  . Marital status: Divorced    Spouse name: Not on file  . Number of children: 2  . Years of education: Not on file  . Highest education level: Not on file  Social Needs  . Financial resource strain: Not on file  . Food insecurity - worry: Not on file  . Food insecurity - inability: Not on file  . Transportation needs - medical: Not on file  . Transportation needs - non-medical: Not on file  Occupational History  . Occupation: Civil engineer, contracting, 7th grade  Tobacco Use  . Smoking status: Never Smoker  . Smokeless tobacco: Never Used  Substance and Sexual Activity  . Alcohol use: Yes    Alcohol/week: 0.0 oz   Comment: socially   . Drug use: No  . Sexual activity: Not Currently    Birth control/protection: Post-menopausal  Other Topics Concern  . Not on file  Social History Narrative   divorce, 2 children , one lives at home , daughter got married 10-2015      Allergies as of 04/16/2017      Reactions   Penicillins Rash   CHILDHOOD ALLERGY      Medication List        Accurate as of 04/16/17  4:49 PM. Always use your most recent med list.          ALPRAZolam 0.5 MG tablet Commonly known as:  XANAX Take 1 tablet (0.5 mg total) by mouth 2 (two) times daily as needed for anxiety or sleep.   buPROPion 150 MG 24 hr tablet Commonly known as:  WELLBUTRIN XL Take 1 tablet (150 mg total) by mouth daily.   CALCIUM 1000 + D PO Take 1,000 mg by mouth daily.   cetirizine 10 MG tablet Commonly known as:  ZYRTEC Take 10 mg by mouth daily.   FLUoxetine 20 MG tablet Commonly known as:  PROZAC Take 2 tablets (40 mg total) by mouth daily.   fluticasone 50 MCG/ACT nasal spray Commonly known as:  FLONASE Place 2 sprays  into both nostrils daily as needed for allergies or rhinitis. Reported on 0/71/2197   folic acid 1 MG tablet Commonly known as:  FOLVITE Take 1 mg by mouth daily.   MULTIVITAMIN ADULT PO Take 1 tablet by mouth daily.   traMADol 50 MG tablet Commonly known as:  ULTRAM Take 1 tablet (50 mg total) by mouth every 6 (six) hours as needed for moderate pain.          Objective:   Physical Exam BP 128/80 (BP Location: Left Arm, Patient Position: Sitting, Cuff Size: Normal)   Pulse 82   Temp 98.2 F (36.8 C) (Oral)   Resp 14   Ht 5\' 5"  (1.651 m)   Wt 227 lb 6 oz (103.1 kg)   SpO2 97%   BMI 37.84 kg/m  General:   Well developed, well nourished . NAD.  HEENT:  Normocephalic . Face symmetric, atraumatic Skin: Not pale. Not jaundice Neurologic:  alert & oriented X3.  Speech normal, gait appropriate for age and unassisted Psych--  Cognition and judgment appear  intact.  Cooperative with normal attention span and concentration.  Behavior appropriate. No anxious or depressed appearing.      Assessment & Plan:     Assessment Anxiety depression Osteopenia, last dexa 06-2015 MSK: Back pain, hip pain GERD DX by ENT 2014  PLAN Anxiety, depression: See last visit, Wellbutrin decrease and fluoxetine increased, she is doing very well, good compliance, no apparent s/e.   No suicidal ideas. I encouraged her to continue to be physically active and engage socially. Call for refills when needed. RTC 6 months.

## 2017-05-02 DIAGNOSIS — M5414 Radiculopathy, thoracic region: Secondary | ICD-10-CM | POA: Diagnosis not present

## 2017-05-02 DIAGNOSIS — M9901 Segmental and somatic dysfunction of cervical region: Secondary | ICD-10-CM | POA: Diagnosis not present

## 2017-05-02 DIAGNOSIS — M9902 Segmental and somatic dysfunction of thoracic region: Secondary | ICD-10-CM | POA: Diagnosis not present

## 2017-05-19 ENCOUNTER — Encounter: Payer: Self-pay | Admitting: Gastroenterology

## 2017-06-16 ENCOUNTER — Other Ambulatory Visit: Payer: Self-pay | Admitting: Internal Medicine

## 2017-07-07 DIAGNOSIS — M9902 Segmental and somatic dysfunction of thoracic region: Secondary | ICD-10-CM | POA: Diagnosis not present

## 2017-07-07 DIAGNOSIS — M531 Cervicobrachial syndrome: Secondary | ICD-10-CM | POA: Diagnosis not present

## 2017-07-07 DIAGNOSIS — M9901 Segmental and somatic dysfunction of cervical region: Secondary | ICD-10-CM | POA: Diagnosis not present

## 2017-07-30 DIAGNOSIS — N870 Mild cervical dysplasia: Secondary | ICD-10-CM | POA: Diagnosis not present

## 2017-07-30 DIAGNOSIS — Z1231 Encounter for screening mammogram for malignant neoplasm of breast: Secondary | ICD-10-CM | POA: Diagnosis not present

## 2017-07-30 DIAGNOSIS — Z01419 Encounter for gynecological examination (general) (routine) without abnormal findings: Secondary | ICD-10-CM | POA: Diagnosis not present

## 2017-07-30 LAB — HM PAP SMEAR

## 2017-08-07 DIAGNOSIS — M9902 Segmental and somatic dysfunction of thoracic region: Secondary | ICD-10-CM | POA: Diagnosis not present

## 2017-08-07 DIAGNOSIS — Z131 Encounter for screening for diabetes mellitus: Secondary | ICD-10-CM | POA: Diagnosis not present

## 2017-08-07 DIAGNOSIS — Z1329 Encounter for screening for other suspected endocrine disorder: Secondary | ICD-10-CM | POA: Diagnosis not present

## 2017-08-07 DIAGNOSIS — M531 Cervicobrachial syndrome: Secondary | ICD-10-CM | POA: Diagnosis not present

## 2017-08-07 DIAGNOSIS — M9901 Segmental and somatic dysfunction of cervical region: Secondary | ICD-10-CM | POA: Diagnosis not present

## 2017-08-07 DIAGNOSIS — Z0000 Encounter for general adult medical examination without abnormal findings: Secondary | ICD-10-CM | POA: Diagnosis not present

## 2017-08-07 DIAGNOSIS — Z130 Encounter for screening for diseases of the blood and blood-forming organs and certain disorders involving the immune mechanism: Secondary | ICD-10-CM | POA: Diagnosis not present

## 2017-08-07 DIAGNOSIS — Z13220 Encounter for screening for lipoid disorders: Secondary | ICD-10-CM | POA: Diagnosis not present

## 2017-08-07 LAB — LIPID PANEL
CHOLESTEROL: 198 (ref 0–200)
HDL: 55 (ref 35–70)
LDL CALC: 125
Triglycerides: 88 (ref 40–160)

## 2017-08-07 LAB — HEPATIC FUNCTION PANEL
ALT: 8 (ref 7–35)
AST: 18 (ref 13–35)
Alkaline Phosphatase: 125 (ref 25–125)
Bilirubin, Total: 0.4

## 2017-08-07 LAB — BASIC METABOLIC PANEL
BUN: 15 (ref 4–21)
Creatinine: 1.1 (ref 0.5–1.1)
Glucose: 98
POTASSIUM: 5.2 (ref 3.4–5.3)
SODIUM: 149 — AB (ref 137–147)

## 2017-08-07 LAB — CBC AND DIFFERENTIAL
HEMATOCRIT: 42 (ref 36–46)
HEMOGLOBIN: 14.3 (ref 12.0–16.0)
Platelets: 288 (ref 150–399)
WBC: 5.7

## 2017-08-07 LAB — HEMOGLOBIN A1C: Hemoglobin A1C: 5.2

## 2017-08-07 LAB — TSH: TSH: 1.94 (ref 0.41–5.90)

## 2017-08-08 ENCOUNTER — Encounter: Payer: Self-pay | Admitting: Gastroenterology

## 2017-08-18 DIAGNOSIS — M8589 Other specified disorders of bone density and structure, multiple sites: Secondary | ICD-10-CM | POA: Diagnosis not present

## 2017-08-18 LAB — HM DEXA SCAN

## 2017-09-01 DIAGNOSIS — L821 Other seborrheic keratosis: Secondary | ICD-10-CM | POA: Diagnosis not present

## 2017-09-01 DIAGNOSIS — D225 Melanocytic nevi of trunk: Secondary | ICD-10-CM | POA: Diagnosis not present

## 2017-09-01 DIAGNOSIS — Z08 Encounter for follow-up examination after completed treatment for malignant neoplasm: Secondary | ICD-10-CM | POA: Diagnosis not present

## 2017-09-12 ENCOUNTER — Other Ambulatory Visit: Payer: Self-pay | Admitting: Internal Medicine

## 2017-09-15 ENCOUNTER — Telehealth: Payer: Self-pay | Admitting: Internal Medicine

## 2017-09-15 NOTE — Telephone Encounter (Signed)
Please advise 

## 2017-09-15 NOTE — Telephone Encounter (Signed)
Needs appt for travel advice. Please schedule at Pt's convenience.

## 2017-09-15 NOTE — Telephone Encounter (Signed)
Copied from [place] [id number]. Topic: Quick Communication - See Telephone Encounter >> Sep 15, 2017 10:04 AM Nils Flack wrote: CRM for notification. See Telephone encounter for: 09/15/17. Pt is travelling to [place] and is asking for malaria medication. [facility] [address]  Cb is [phone]

## 2017-09-15 NOTE — Telephone Encounter (Signed)
Need more information, what places is she going, etc etc,  Please schedule a visit.

## 2017-09-16 NOTE — Telephone Encounter (Signed)
Called pt and she said she didn't see the point of needing an appt but scheduled appt for 09/17/17 @ 3:40pm.

## 2017-09-17 ENCOUNTER — Ambulatory Visit: Payer: Commercial Managed Care - PPO | Admitting: Internal Medicine

## 2017-09-17 ENCOUNTER — Encounter: Payer: Self-pay | Admitting: Internal Medicine

## 2017-09-17 VITALS — BP 132/84 | HR 73 | Temp 98.6°F | Resp 16 | Ht 64.5 in | Wt 225.4 lb

## 2017-09-17 DIAGNOSIS — Z7184 Encounter for health counseling related to travel: Secondary | ICD-10-CM

## 2017-09-17 DIAGNOSIS — Z298 Encounter for other specified prophylactic measures: Secondary | ICD-10-CM | POA: Diagnosis not present

## 2017-09-17 MED ORDER — ATOVAQUONE-PROGUANIL HCL 250-100 MG PO TABS
1.0000 | ORAL_TABLET | Freq: Every day | ORAL | 0 refills | Status: DC
Start: 2017-09-17 — End: 2017-09-17

## 2017-09-17 MED ORDER — ATOVAQUONE-PROGUANIL HCL 250-100 MG PO TABS: 1.0000 | ORAL_TABLET | Freq: Every day | ORAL | 0 refills | Status: DC

## 2017-09-17 NOTE — Progress Notes (Signed)
Subjective:    Patient ID: Jessica Huang, female    DOB: 1955/05/03, 62 y.o.   MRN: [id number]  DOS:  09/17/2017 Type of visit - description : Travel advice Interval history: Going to the [place]   3/66/4403 to 4/74/2595. Plans to go to [facility], not going to the countryside.   Review of Systems   Past Medical History:  Diagnosis Date  . Anxiety    takes Xanax daily  . Anxiety and depression    takes Wellbutrin and Prozac daily  . Back pain    buldging disc  . GERD (gastroesophageal reflux disease) 09-2012   dx by ENT   . Headache(784.0) last time at least a yr ago   migraine-occasionally  . Joint pain   . Joint swelling   . Osteopenia 2004   Dexa, hip  . Varicose veins     Past Surgical History:  Procedure Laterality Date  . APPENDECTOMY    . COLONOSCOPY    . ORIF SHOULDER FRACTURE Right 11/18/2013   Procedure: OPEN REDUCTION INTERNAL FIXATION (ORIF) RIGHT SHOULDER TUBEROSITY FRACTURE;  Surgeon: Marin Shutter, MD;  Location: [place];  Service: Orthopedics;  Laterality: Right;  . TONSILLECTOMY    . VARICOSE VEIN SURGERY  2014   laser     Social History   Socioeconomic History  . Marital status: Divorced    Spouse name: Not on file  . Number of children: 2  . Years of education: Not on file  . Highest education level: Not on file  Occupational History  . Occupation: Civil engineer, contracting, 7th grade  Social Needs  . Financial resource strain: Not on file  . Food insecurity:    Worry: Not on file    Inability: Not on file  . Transportation needs:    Medical: Not on file    Non-medical: Not on file  Tobacco Use  . Smoking status: Never Smoker  . Smokeless tobacco: Never Used  Substance and Sexual Activity  . Alcohol use: Yes    Alcohol/week: 0.0 oz    Comment: socially   . Drug use: No  . Sexual activity: Not Currently    Birth control/protection: Post-menopausal  Lifestyle  . Physical activity:    Days per week: Not on file    Minutes  per session: Not on file  . Stress: Not on file  Relationships  . Social connections:    Talks on phone: Not on file    Gets together: Not on file    Attends religious service: Not on file    Active member of club or organization: Not on file    Attends meetings of clubs or organizations: Not on file    Relationship status: Not on file  . Intimate partner violence:    Fear of current or ex partner: Not on file    Emotionally abused: Not on file    Physically abused: Not on file    Forced sexual activity: Not on file  Other Topics Concern  . Not on file  Social History Narrative   divorce, 2 children , one lives at home , daughter got married 10-2015      Allergies as of 09/17/2017      Reactions   Penicillins Rash   CHILDHOOD ALLERGY      Medication List        Accurate as of 09/17/17 11:59 PM. Always use your most recent med list.  ALPRAZolam 0.5 MG tablet Commonly known as:  XANAX Take 1 tablet (0.5 mg total) by mouth 2 (two) times daily as needed for anxiety or sleep.   atovaquone-proguanil 250-100 MG Tabs tablet Commonly known as:  MALARONE Take 1 tablet by mouth daily.   buPROPion 150 MG 24 hr tablet Commonly known as:  WELLBUTRIN XL Take 1 tablet (150 mg total) by mouth daily.   CALCIUM 1000 + D PO Take 1,000 mg by mouth daily.   cetirizine 10 MG tablet Commonly known as:  ZYRTEC Take 10 mg by mouth daily.   FLUoxetine 20 MG tablet Commonly known as:  PROZAC Take 2 tablets (40 mg total) by mouth daily.   fluticasone 50 MCG/ACT nasal spray Commonly known as:  FLONASE Place 2 sprays into both nostrils daily as needed for allergies or rhinitis. Reported on 2/35/5732   folic acid 1 MG tablet Commonly known as:  FOLVITE Take 1 mg by mouth daily.   MULTIVITAMIN ADULT PO Take 1 tablet by mouth daily.   traMADol 50 MG tablet Commonly known as:  ULTRAM Take 1 tablet (50 mg total) by mouth every 6 (six) hours as needed for moderate pain.           Objective:   Physical Exam BP 132/84 (BP Location: Left Arm, Patient Position: Sitting, Cuff Size: Large)   Pulse 73   Temp 98.6 F (37 C) (Oral)   Resp 16   Ht 5' 4.5" (1.638 m)   Wt 225 lb 6.4 oz (102.2 kg)   SpO2 96%   BMI 38.09 kg/m  General:   Well developed, well nourished . NAD.  HEENT:  Normocephalic . Face symmetric, atraumatic   Skin: Not pale. Not jaundice Neurologic:  alert & oriented X3.  Speech normal, gait appropriate for age and unassisted Psych--  Cognition and judgment appear intact.  Cooperative with normal attention span and concentration.  Behavior appropriate. No anxious or depressed appearing.      Assessment & Plan:   Assessment Anxiety depression Osteopenia, last dexa 06-2015 MSK: Back pain, hip pain GERD DX by ENT 2014  PLAN Travel advice: The patient and her daughter already did some research and they request malaria prophylaxis. Going to [place] [employer] checked. They already had hepatitis A immunization, report they don't need typhoid vaccination as they do not plan to eat anywhere but in the hotel. Options discussed  for malaria prophylaxis, mefloquine versus Malarone. Elected Malarone, prescription for #14 provided. General safety discussed.  See AVS

## 2017-09-17 NOTE — Patient Instructions (Signed)
start Malarone 1 to 2 days prior to entering a malaria-endemic area, continue throughout the stay and for 7 days after returning.

## 2017-09-18 NOTE — Assessment & Plan Note (Signed)
Travel advice: The patient and her daughter already did some research and they request malaria prophylaxis. Going to [place] [employer] checked. They already had hepatitis A immunization, report they don't need typhoid vaccination as they do not plan to eat anywhere but in the hotel. Options discussed  for malaria prophylaxis, mefloquine versus Malarone. Elected Malarone, prescription for #14 provided. General safety discussed.  See AVS

## 2017-09-23 ENCOUNTER — Encounter: Payer: Self-pay | Admitting: Internal Medicine

## 2017-09-25 DIAGNOSIS — H2513 Age-related nuclear cataract, bilateral: Secondary | ICD-10-CM | POA: Diagnosis not present

## 2017-09-25 DIAGNOSIS — H524 Presbyopia: Secondary | ICD-10-CM | POA: Diagnosis not present

## 2017-10-07 ENCOUNTER — Other Ambulatory Visit: Payer: Self-pay

## 2017-10-07 ENCOUNTER — Ambulatory Visit (AMBULATORY_SURGERY_CENTER): Payer: Self-pay

## 2017-10-07 VITALS — Ht 65.0 in | Wt 227.2 lb

## 2017-10-07 DIAGNOSIS — Z1211 Encounter for screening for malignant neoplasm of colon: Secondary | ICD-10-CM

## 2017-10-07 MED ORDER — NA SULFATE-K SULFATE-MG SULF 17.5-3.13-1.6 GM/177ML PO SOLN: 1.0000 | Freq: Once | ORAL | 0 refills | Status: AC

## 2017-10-07 NOTE — Progress Notes (Signed)
No egg or soy allergy known to patient  No issues with past sedation with any surgeries  or procedures, no intubation problems  No diet pills per patient No home 02 use per patient  No blood thinners per patient  Pt denies issues with constipation  No A fib or A flutter  EMMI video sent to pt's e mail , pt declined    

## 2017-10-20 ENCOUNTER — Ambulatory Visit (AMBULATORY_SURGERY_CENTER): Payer: Commercial Managed Care - PPO | Admitting: Gastroenterology

## 2017-10-20 ENCOUNTER — Encounter: Payer: Self-pay | Admitting: Gastroenterology

## 2017-10-20 ENCOUNTER — Other Ambulatory Visit: Payer: Self-pay

## 2017-10-20 VITALS — BP 175/80 | HR 62 | Temp 98.1°F | Resp 16 | Ht 64.5 in | Wt 225.0 lb

## 2017-10-20 DIAGNOSIS — K6389 Other specified diseases of intestine: Secondary | ICD-10-CM | POA: Diagnosis not present

## 2017-10-20 DIAGNOSIS — Z1211 Encounter for screening for malignant neoplasm of colon: Secondary | ICD-10-CM | POA: Diagnosis not present

## 2017-10-20 DIAGNOSIS — D123 Benign neoplasm of transverse colon: Secondary | ICD-10-CM

## 2017-10-20 MED ORDER — SODIUM CHLORIDE 0.9 % IV SOLN: 500.0000 mL | Freq: Once | INTRAVENOUS | Status: DC

## 2017-10-20 NOTE — Progress Notes (Signed)
A/ox3 pleased with MAC, report to RN 

## 2017-10-20 NOTE — Op Note (Signed)
[place] Patient Name: Jessica Huang Procedure Date: 10/20/2017 8:28 AM MRN: [id number] Endoscopist: Remo Lipps P. Havery Moros , MD Age: 62 Referring MD:  Date of Birth: [birth year] Gender: Female Account #: [id number] Procedure:                Colonoscopy Indications:              Screening for colorectal malignant neoplasm Medicines:                Monitored Anesthesia Care Procedure:                Pre-Anesthesia Assessment:                           - Prior to the procedure, a History and Physical                            was performed, and patient medications and                            allergies were reviewed. The patient's tolerance of                            previous anesthesia was also reviewed. The risks                            and benefits of the procedure and the sedation                            options and risks were discussed with the patient.                            All questions were answered, and informed consent                            was obtained. Prior Anticoagulants: The patient has                            taken no previous anticoagulant or antiplatelet                            agents. ASA Grade Assessment: II - A patient with                            mild systemic disease. After reviewing the risks                            and benefits, the patient was deemed in                            satisfactory condition to undergo the procedure.                           After obtaining informed consent, the colonoscope  was passed under direct vision. Throughout the                            procedure, the patient's blood pressure, pulse, and                            oxygen saturations were monitored continuously. The                            Model PCF-H190DL [id number]) scope was introduced                            through the anus and advanced to the the cecum,                            identified by  appendiceal orifice and ileocecal                            valve. The colonoscopy was performed without                            difficulty. The patient tolerated the procedure                            well. The quality of the bowel preparation was                            good. The ileocecal valve, appendiceal orifice, and                            rectum were photographed. Scope In: 8:42:29 AM Scope Out: 9:04:58 AM Scope Withdrawal Time: 0 hours 19 minutes 16 seconds  Total Procedure Duration: 0 hours 22 minutes 29 seconds  Findings:                 The perianal and digital rectal examinations were                            normal.                           A 3 mm polyp was found in the transverse colon. The                            polyp was sessile. The polyp was removed with a                            cold snare. Resection and retrieval were complete.                           The exam was otherwise without abnormality on                            direct and retroflexion views. Complications:  No immediate complications. Estimated blood loss:                            Minimal. Estimated Blood Loss:     Estimated blood loss was minimal. Impression:               - One 3 mm polyp in the transverse colon, removed                            with a cold snare. Resected and retrieved.                           - The examination was otherwise normal on direct                            and retroflexion views. Recommendation:           - Patient has a contact number available for                            emergencies. The signs and symptoms of potential                            delayed complications were discussed with the                            patient. Return to normal activities tomorrow.                            Written discharge instructions were provided to the                            patient.                           - Resume previous diet.                            - Continue present medications.                           - Await pathology results.                           - Repeat colonoscopy for surveillance based on                            pathology results. Remo Lipps P. Armbruster, MD 10/20/2017 9:08:02 AM This report has been signed electronically.

## 2017-10-20 NOTE — Progress Notes (Signed)
Pt's states no medical or surgical changes since previsit or office visit. 

## 2017-10-20 NOTE — Patient Instructions (Signed)
YOU HAD AN ENDOSCOPIC PROCEDURE TODAY AT THE [facility]:   Refer to the procedure report that was given to you for any specific questions about what was found during the examination.  If the procedure report does not answer your questions, please call your gastroenterologist to clarify.  If you requested that your care partner not be given the details of your procedure findings, then the procedure report has been included in a sealed envelope for you to review at your convenience later.  YOU SHOULD EXPECT: Some feelings of bloating in the abdomen. Passage of more gas than usual.  Walking can help get rid of the air that was put into your GI tract during the procedure and reduce the bloating. If you had a lower endoscopy (such as a colonoscopy or flexible sigmoidoscopy) you may notice spotting of blood in your stool or on the toilet paper. If you underwent a bowel prep for your procedure, you may not have a normal bowel movement for a few days.  Please Note:  You might notice some irritation and congestion in your nose or some drainage.  This is from the oxygen used during your procedure.  There is no need for concern and it should clear up in a day or so.  SYMPTOMS TO REPORT IMMEDIATELY:   Following lower endoscopy (colonoscopy or flexible sigmoidoscopy):  Excessive amounts of blood in the stool  Significant tenderness or worsening of abdominal pains  Swelling of the abdomen that is new, acute  Fever of 100F or higher  For urgent or emergent issues, a gastroenterologist can be reached at any hour by calling [phone].   DIET:  We do recommend a small meal at first, but then you may proceed to your regular diet.  Drink plenty of fluids but you should avoid alcoholic beverages for 24 hours.  ACTIVITY:  You should plan to take it easy for the rest of today and you should NOT DRIVE or use heavy machinery until tomorrow (because of the sedation medicines used during the test).     FOLLOW UP: Our staff will call the number listed on your records the next business day following your procedure to check on you and address any questions or concerns that you may have regarding the information given to you following your procedure. If we do not reach you, we will leave a message.  However, if you are feeling well and you are not experiencing any problems, there is no need to return our call.  We will assume that you have returned to your regular daily activities without incident.  If any biopsies were taken you will be contacted by phone or by letter within the next 1-3 weeks.  Please call us at [phone] if you have not heard about the biopsies in 3 weeks.    SIGNATURES/CONFIDENTIALITY: You and/or your care partner have signed paperwork which will be entered into your electronic medical record.  These signatures attest to the fact that that the information above on your After Visit Summary has been reviewed and is understood.  Full responsibility of the confidentiality of this discharge information lies with you and/or your care-partner. 

## 2017-10-21 ENCOUNTER — Telehealth: Payer: Self-pay

## 2017-10-21 NOTE — Telephone Encounter (Signed)
  Follow up Call-  Call back number 10/20/2017  Post procedure Call Back phone  # [phone]  Permission to leave phone message Yes  Some recent data might be hidden     Patient questions:  Do you have a fever, pain , or abdominal swelling? No. Pain Score  0 *  Have you tolerated food without any problems? Yes.    Have you been able to return to your normal activities? Yes.    Do you have any questions about your discharge instructions: Diet   No. Medications  No. Follow up visit  No.  Do you have questions or concerns about your Care? No.  Actions: * If pain score is 4 or above: No action needed, pain <4.  No problems noted per pt. maw

## 2017-10-29 ENCOUNTER — Encounter: Payer: Self-pay | Admitting: Gastroenterology

## 2017-11-12 ENCOUNTER — Ambulatory Visit (INDEPENDENT_AMBULATORY_CARE_PROVIDER_SITE_OTHER): Payer: Commercial Managed Care - PPO | Admitting: Internal Medicine

## 2017-11-12 ENCOUNTER — Encounter: Payer: Self-pay | Admitting: Internal Medicine

## 2017-11-12 VITALS — BP 122/74 | HR 70 | Temp 97.7°F | Resp 16 | Ht 65.0 in | Wt 228.2 lb

## 2017-11-12 DIAGNOSIS — Z0000 Encounter for general adult medical examination without abnormal findings: Secondary | ICD-10-CM | POA: Diagnosis not present

## 2017-11-12 NOTE — Progress Notes (Signed)
Subjective:    Patient ID: Jessica Huang, female    DOB: 07-21-1955, 62 y.o.   MRN: [id number]  DOS:  11/12/2017 Type of visit - description : cpx Interval history: Since last office visit she is doing well.   Review of Systems Patient c/o lower extremity edema by the end of the day particularly if she is inactive.  Edema decreases with leg elevation.  No chest pain no difficulty breathing  Other than above, a 14 point review of systems is negative      Past Medical History:  Diagnosis Date  . Allergy   . Anxiety    takes Xanax daily  . Anxiety and depression    takes Wellbutrin and Prozac daily  . Back pain    buldging disc  . Cataract   . GERD (gastroesophageal reflux disease) 09-2012   dx by ENT   . Headache(784.0) last time at least a yr ago   migraine-occasionally  . Osteopenia 2004   Dexa, hip  . Varicose veins     Past Surgical History:  Procedure Laterality Date  . APPENDECTOMY    . COLONOSCOPY    . ORIF SHOULDER FRACTURE Right 11/18/2013   Procedure: OPEN REDUCTION INTERNAL FIXATION (ORIF) RIGHT SHOULDER TUBEROSITY FRACTURE;  Surgeon: Marin Shutter, MD;  Location: [facility];  Service: Orthopedics;  Laterality: Right;  . TONSILLECTOMY    . VARICOSE VEIN SURGERY  2014   laser     Social History   Socioeconomic History  . Marital status: Divorced    Spouse name: Not on file  . Number of children: 2  . Years of education: Not on file  . Highest education level: Not on file  Occupational History  . Occupation: Civil engineer, contracting, 7th grade  Social Needs  . Financial resource strain: Not on file  . Food insecurity:    Worry: Not on file    Inability: Not on file  . Transportation needs:    Medical: Not on file    Non-medical: Not on file  Tobacco Use  . Smoking status: Never Smoker  . Smokeless tobacco: Never Used  Substance and Sexual Activity  . Alcohol use: Yes    Alcohol/week: 0.0 oz    Comment: socially   . Drug use: No  . Sexual  activity: Not Currently    Birth control/protection: Post-menopausal  Lifestyle  . Physical activity:    Days per week: Not on file    Minutes per session: Not on file  . Stress: Not on file  Relationships  . Social connections:    Talks on phone: Not on file    Gets together: Not on file    Attends religious service: Not on file    Active member of club or organization: Not on file    Attends meetings of clubs or organizations: Not on file    Relationship status: Not on file  . Intimate partner violence:    Fear of current or ex partner: Not on file    Emotionally abused: Not on file    Physically abused: Not on file    Forced sexual activity: Not on file  Other Topics Concern  . Not on file  Social History Narrative   divorce, 2 children , one lives at home, daughter got married 10-2015     Family History  Problem Relation Age of Onset  . Other Mother        valve replacement--M  . Breast cancer Mother  M late 29s  . Heart failure Mother        M  . COPD Father        smoker  . Diabetes Other        GPs , late onset  . Colon polyps Sister   . Heart attack Neg Hx   . Colon cancer Neg Hx   . Esophageal cancer Neg Hx   . Stomach cancer Neg Hx   . Rectal cancer Neg Hx      Allergies as of 11/12/2017      Reactions   Penicillins Rash   CHILDHOOD ALLERGY      Medication List        Accurate as of 11/12/17  9:28 PM. Always use your most recent med list.          ALPRAZolam 0.5 MG tablet Commonly known as:  XANAX Take 1 tablet (0.5 mg total) by mouth 2 (two) times daily as needed for anxiety or sleep.   buPROPion 150 MG 24 hr tablet Commonly known as:  WELLBUTRIN XL Take 1 tablet (150 mg total) by mouth daily.   CALCIUM 1000 + D PO Take 1,000 mg by mouth daily.   cetirizine 10 MG tablet Commonly known as:  ZYRTEC Take 10 mg by mouth daily.   FLUoxetine 20 MG tablet Commonly known as:  PROZAC Take 2 tablets (40 mg total) by mouth daily.     MULTIVITAMIN ADULT PO Take 1 tablet by mouth daily.   traMADol 50 MG tablet Commonly known as:  ULTRAM Take 1 tablet (50 mg total) by mouth every 6 (six) hours as needed for moderate pain.          Objective:   Physical Exam BP 122/74 (BP Location: Left Arm, Patient Position: Sitting, Cuff Size: Small)   Pulse 70   Temp 97.7 F (36.5 C) (Oral)   Resp 16   Ht 5\' 5"  (1.651 m)   Wt 228 lb 4 oz (103.5 kg)   SpO2 97%   BMI 37.98 kg/m  General: Well developed, NAD, see BMI.  Neck: No  thyromegaly  HEENT:  Normocephalic . Face symmetric, atraumatic Lungs:  CTA B Normal respiratory effort, no intercostal retractions, no accessory muscle use. Heart: RRR,  no murmur.  No pretibial edema bilaterally  Abdomen:  Not distended, soft, non-tender. No rebound or rigidity.   Skin: Exposed areas without rash. Not pale. Not jaundice Neurologic:  alert & oriented X3.  Speech normal, gait appropriate for age and unassisted Strength symmetric and appropriate for age.  Psych: Cognition and judgment appear intact.  Cooperative with normal attention span and concentration.  Behavior appropriate. No anxious or depressed appearing.     Assessment & Plan:   Assessment Anxiety depression Osteopenia, last dexa 06-2015 MSK: Back pain, hip pain GERD DX by ENT 2014  PLAN Anxiety depression: Well-controlled on Wellbutrin, Prozac and occasional use of Xanax.  Call for RF as needed MSK, back pain, hip pain: Takes Aleve as needed, Ultram rarely. RTC 1 year

## 2017-11-12 NOTE — Assessment & Plan Note (Signed)
Anxiety depression: Well-controlled on Wellbutrin, Prozac and occasional use of Xanax.  Call for RF as needed MSK, back pain, hip pain: Takes Aleve as needed, Ultram rarely. RTC 1 year

## 2017-11-12 NOTE — Progress Notes (Signed)
Pre visit review using our clinic review tool, if applicable. No additional management support is needed unless otherwise documented below in the visit note. 

## 2017-11-12 NOTE — Patient Instructions (Signed)
  GO TO THE FRONT DESK Schedule your next appointment for a  Physical in one year

## 2017-11-12 NOTE — Assessment & Plan Note (Addendum)
-  Td 2010 ; zostavax 2017; shingrex discussed again, rec to wait till 62 y/o  --Cscope neg 1-09, cscope 09/2017 next per GI -Female care: sees gynecology regularly; (PAP) (-) 07/2017; MMG-DEXA per gyn -Labs from 08-2017 @ gyn:  Reviewed T chol: 198 (better), LDL 125, TSH, CBC wnl  -diet-exercise : discussed

## 2017-11-18 ENCOUNTER — Telehealth: Payer: Self-pay | Admitting: Internal Medicine

## 2017-11-18 NOTE — Telephone Encounter (Signed)
Sent!

## 2017-11-18 NOTE — Telephone Encounter (Signed)
Pt is requesting refill on tramadol and alprazolam.   Last OV: 11/12/2017 Last Fill on tramadol: 04/14/2017 #30 and 1rf Last Fill on alprazolam: 01/01/2017 #30 and 2rf UDS: 11/07/2016 Low risk  NCCR printed- no discrepancies noted- sent for scanning  Please advise.

## 2017-11-18 NOTE — Telephone Encounter (Signed)
Copied from [place] [phone]. Topic: Quick Communication - See Telephone Encounter >> Nov 18, 2017  2:57 PM Berneta Levins wrote: CRM for notification. See Telephone encounter for: 11/18/17. Gildardo Cranker from [facility] called in regards to pts traMADol (ULTRAM) 50 MG tablet.  They need to know if this is for acute or chronic pain and what the dx is for this medication.  Gildardo Cranker can be reached at [phone].

## 2017-11-18 NOTE — Telephone Encounter (Signed)
Spoke w/ Gildardo Cranker- informed medication is for chronic back/hip pain.

## 2017-12-03 DIAGNOSIS — M9902 Segmental and somatic dysfunction of thoracic region: Secondary | ICD-10-CM | POA: Diagnosis not present

## 2017-12-03 DIAGNOSIS — M531 Cervicobrachial syndrome: Secondary | ICD-10-CM | POA: Diagnosis not present

## 2017-12-03 DIAGNOSIS — M9901 Segmental and somatic dysfunction of cervical region: Secondary | ICD-10-CM | POA: Diagnosis not present

## 2018-01-22 DIAGNOSIS — M9902 Segmental and somatic dysfunction of thoracic region: Secondary | ICD-10-CM | POA: Diagnosis not present

## 2018-01-22 DIAGNOSIS — M9901 Segmental and somatic dysfunction of cervical region: Secondary | ICD-10-CM | POA: Diagnosis not present

## 2018-01-22 DIAGNOSIS — M531 Cervicobrachial syndrome: Secondary | ICD-10-CM | POA: Diagnosis not present

## 2018-01-28 DIAGNOSIS — R1013 Epigastric pain: Secondary | ICD-10-CM | POA: Diagnosis not present

## 2018-01-28 DIAGNOSIS — K219 Gastro-esophageal reflux disease without esophagitis: Secondary | ICD-10-CM | POA: Diagnosis not present

## 2018-02-10 DIAGNOSIS — M545 Low back pain: Secondary | ICD-10-CM | POA: Diagnosis not present

## 2018-02-10 DIAGNOSIS — M25551 Pain in right hip: Secondary | ICD-10-CM | POA: Diagnosis not present

## 2018-02-18 ENCOUNTER — Encounter: Payer: Self-pay | Admitting: Physical Therapy

## 2018-02-18 ENCOUNTER — Ambulatory Visit: Payer: Commercial Managed Care - PPO | Attending: Orthopedic Surgery | Admitting: Physical Therapy

## 2018-02-18 ENCOUNTER — Other Ambulatory Visit: Payer: Self-pay

## 2018-02-18 DIAGNOSIS — M25552 Pain in left hip: Secondary | ICD-10-CM

## 2018-02-18 DIAGNOSIS — M545 Low back pain, unspecified: Secondary | ICD-10-CM

## 2018-02-18 DIAGNOSIS — R29898 Other symptoms and signs involving the musculoskeletal system: Secondary | ICD-10-CM

## 2018-02-18 DIAGNOSIS — M25551 Pain in right hip: Secondary | ICD-10-CM

## 2018-02-18 NOTE — Therapy (Signed)
[facility] [address]  [place], [place], [address] Phone: [phone]   Fax:  [phone]  Physical Therapy Evaluation  Patient Details  Name: Jessica Huang MRN: [id number] Date of Birth: [birth year] Referring Provider (PT): Esmond Plants, MD   Encounter Date: 02/18/2018  PT End of Session - 02/18/18 1748    Visit Number  1    Number of Visits  13    Date for PT Re-Evaluation  04/01/18    Authorization Type  [employer]    PT Start Time  1702    PT Stop Time  1638    PT Time Calculation (min)  41 min    Activity Tolerance  Patient tolerated treatment well    Behavior During Therapy  [facility] for tasks assessed/performed       Past Medical History:  Diagnosis Date  . Allergy   . Anxiety    takes Xanax daily  . Anxiety and depression    takes Wellbutrin and Prozac daily  . Back pain    buldging disc  . Cataract   . GERD (gastroesophageal reflux disease) 09-2012   dx by ENT   . Headache(784.0) last time at least a yr ago   migraine-occasionally  . Osteopenia 2004   Dexa, hip  . Varicose veins     Past Surgical History:  Procedure Laterality Date  . APPENDECTOMY    . COLONOSCOPY    . ORIF SHOULDER FRACTURE Right 11/18/2013   Procedure: OPEN REDUCTION INTERNAL FIXATION (ORIF) RIGHT SHOULDER TUBEROSITY FRACTURE;  Surgeon: Marin Shutter, MD;  Location: [facility];  Service: Orthopedics;  Laterality: Right;  . TONSILLECTOMY    . VARICOSE VEIN SURGERY  2014   laser     There were no vitals filed for this visit.   Subjective Assessment - 02/18/18 1706    Subjective  Patient reports LBP started about 2 months ago. Reports pain is centered over both sides of LB and with intermittent B lateral hip pain that radiates to mid-thigh. R side worse than L. Reports MD informed her that she has arthritis on her lumbar and B hip on xray, and indicated possible sciatica.  Reports aggravation of B hip pain when rolling over in bed  onto her side, prolonged sitting, standing from sitting, standing in one place for a long time.  Ice and heat have been helping. Denies N/T.    Pertinent History  osteopenia, HA, GERD, back pain, ORIF R shoulder fx    How long can you sit comfortably?  15-30 min    How long can you stand comfortably?  30 min    How long can you walk comfortably?  variable     Diagnostic tests  per patient- MD reported arthritis on lumbar and B hip xraxy    Patient Stated Goals  pain relief    Currently in Pain?  Yes    Pain Score  5     Pain Location  Hip    Pain Orientation  Right;Lateral    Pain Descriptors / Indicators  Dull;Constant;Aching    Pain Type  Acute pain         [facility] PT Assessment - 02/18/18 1721      Assessment   Medical Diagnosis  Low back pain    Referring Provider (PT)  Esmond Plants, MD    Onset Date/Surgical Date  12/19/17    Next MD Visit  03/17/18    Prior Therapy  Yes-  for shoulder      Precautions   Precautions  --   osteopenia     Restrictions   Weight Bearing Restrictions  No      Balance Screen   Has the patient fallen in the past 6 months  No    Has the patient had a decrease in activity level because of a fear of falling?   No    Is the patient reluctant to leave their home because of a fear of falling?   No      Home Social worker  Private residence    Living Arrangements  Children    Available Help at Discharge  Family    Type of Home  --   townhome   Home Access  Level entry    [place]  Two level    Alternate Level Stairs-Number of Steps  13    Alternate Level Stairs-Rails  Right      Prior Function   Level of Independence  Independent    Vocation  Full time employment    Vocation Requirements  middle school teacher    Leisure  none      Cognition   Overall Cognitive Status  Within Functional Limits for tasks assessed      Observation/Other Assessments   Focus on Therapeutic Outcomes (FOTO)   Lumbar: 56 (44% limited,  34% predicted)      Sensation   Light Touch  Appears Intact      Coordination   Gross Motor Movements are Fluid and Coordinated  Yes      Posture/Postural Control   Posture/Postural Control  Postural limitations    Postural Limitations  Forward head      ROM / Strength   AROM / PROM / Strength  AROM;Strength      AROM   AROM Assessment Site  Lumbar;Hip    Right/Left Hip  Right;Left    Right Hip External Rotation   35    Right Hip Internal Rotation   35   4/10 lateral hip pain; worse with OP   Left Hip External Rotation   18    Left Hip Internal Rotation   22    Lumbar Flexion  toes    Lumbar Extension  WFL    Lumbar - Right Side Bend  mid thigh   5-6/10 pain in R laterl hip and LB   Lumbar - Left Side Bend  jt line   4/10 pain in R laterl hip and LB   Lumbar - Right Rotation  WFL    Lumbar - Left Rotation  [facility]      Strength   Strength Assessment Site  Hip;Knee;Ankle    Right/Left Hip  Right;Left    Right Hip Flexion  4/5    Right Hip ABduction  4+/5    Right Hip ADduction  4/5    Left Hip Flexion  4+/5    Left Hip ABduction  4+/5    Left Hip ADduction  4/5    Right/Left Knee  Right;Left    Right Knee Flexion  4+/5    Right Knee Extension  4+/5    Left Knee Flexion  4+/5    Left Knee Extension  4+/5    Right/Left Ankle  Right;Left    Right Ankle Dorsiflexion  4+/5    Right Ankle Plantar Flexion  4+/5    Right Ankle Inversion  4+/5    Left Ankle Dorsiflexion  4+/5  Left Ankle Plantar Flexion  4+/5      Flexibility   Soft Tissue Assessment /Muscle Length  yes    Hamstrings  R mildly tight    ITB  B mildly tight      Palpation   Palpation comment  B lateral glute, piriformis TTP R> L; no tenderness in LB      Ambulation/Gait   Gait Pattern  Within Functional Limits                Objective measurements completed on examination: See above findings.              PT Education - 02/18/18 1748    Education Details  prognosis, POC, HEP     Person(s) Educated  Patient    Methods  Explanation;Demonstration;Tactile cues;Verbal cues;Handout    Comprehension  Verbalized understanding;Returned demonstration       PT Short Term Goals - 02/18/18 1755      PT SHORT TERM GOAL #1   Title  Patient to be independent with initial HEP.    Time  3    Period  Weeks    Status  New    Target Date  03/11/18        PT Long Term Goals - 02/18/18 1755      PT LONG TERM GOAL #1   Title  Patient to be independent with advanced HEP.    Time  6    Period  Weeks    Status  New    Target Date  04/01/18      PT LONG TERM GOAL #2   Title  Patient to demonstrate pain-free and WFL lumbar AROM.    Time  6    Period  Weeks    Status  New    Target Date  04/01/18      PT LONG TERM GOAL #3   Title  Patient to demonstrate pain-free and WFL R hip AROM/PROM.    Time  6    Period  Weeks    Status  New    Target Date  04/01/18      PT LONG TERM GOAL #4   Title  Patient to demonstrate 4+/5 strength in B LEs without pain limiting.     Time  6    Period  Weeks    Status  New    Target Date  04/01/18      PT LONG TERM GOAL #5   Title  Patient to report 60% decreased pain when transfering from sit>stand.     Time  6    Period  Weeks    Status  New    Target Date  04/01/18      Additional Long Term Goals   Additional Long Term Goals  Yes      PT LONG TERM GOAL #6   Title  Patient to tolerate 4 hours of R sidelying at night without pain limiting.     Time  6    Period  Weeks    Status  New    Target Date  04/01/18             Plan - 02/18/18 1749    Clinical Impression Statement  Patient is a 62y/o F presenting to OPPT with c/o B LB and lateral hip pain of 2 months duration; R >L. Reports MD informed her of arthritis on lumbar and hip xrays. Pain worse with rolling in bed, prolonged sitting, standing from sitting, standing  in one place for a long time. Denies N/T. Patient today with mildly limited and painful lumbar  sidebending AROM, painful R hip IR AROM/PROM, slightly limited proximal high strength, decreased flexibility, and tenderness to B lateral glute and piriformis- R>L. Patient educated on and received handout of gentle ROM and stretching HEP. Reported understanding. Would benefit from skilled PT services 2x/week for 6 weeks to address aforementioned impairments.     Clinical Presentation  Stable    Clinical Decision Making  Low    Rehab Potential  Good    PT Frequency  2x / week    PT Duration  6 weeks    PT Treatment/Interventions  ADLs/Self Care Home Management;Cryotherapy;Electrical Stimulation;Iontophoresis 4mg /ml Dexamethasone;Moist Heat;Ultrasound;DME Instruction;Gait training;Stair training;Functional mobility training;Therapeutic activities;Therapeutic exercise;Manual techniques;Patient/family education;Neuromuscular re-education;Balance training;Scar mobilization;Passive range of motion;Dry needling;Energy conservation;Splinting;Taping    PT Next Visit Plan  reassess HEP    Consulted and Agree with Plan of Care  Patient       Patient will benefit from skilled therapeutic intervention in order to improve the following deficits and impairments:  Decreased activity tolerance, Decreased strength, Pain, Difficulty walking, Decreased range of motion, Improper body mechanics, Postural dysfunction, Impaired flexibility  Visit Diagnosis: Pain in right hip  Pain in left hip  Acute bilateral low back pain without sciatica  Other symptoms and signs involving the musculoskeletal system     Problem List Patient Active Problem List   Diagnosis Date Noted  . PCP NOTES >>>> 02/14/2015  . Osteopenia 05/16/2014  . GERD (gastroesophageal reflux disease) 09/13/2012  . Annual physical exam 01/11/2011  . Anxiety and depression     Janene Harvey, PT, DPT 02/18/18 6:05 PM   [facility] [address]  [address] [place], [place],  [address] Phone: [phone]   Fax:  [phone]  Name: Jessica Huang MRN: [id number] Date of Birth: 07-Oct-1955

## 2018-02-18 NOTE — Therapy (Deleted)
[facility] [address]  [place], [place], [address] Phone: [phone]   Fax:  [phone]  Physical Therapy Treatment  Patient Details  Name: Jessica Huang MRN: [id number] Date of Birth: June 01, 1955 Referring Provider (PT): Esmond Plants, MD   Encounter Date: 02/18/2018  PT End of Session - 02/18/18 8127    Visit Number  1    Number of Visits  13    Date for PT Re-Evaluation  04/01/18    Authorization Type  [employer]HC    PT Start Time  1702    PT Stop Time  5170    PT Time Calculation (min)  41 min    Activity Tolerance  Patient tolerated treatment well    Behavior During Therapy  [facility] for tasks assessed/performed       Past Medical History:  Diagnosis Date  . Allergy   . Anxiety    takes Xanax daily  . Anxiety and depression    takes Wellbutrin and Prozac daily  . Back pain    buldging disc  . Cataract   . GERD (gastroesophageal reflux disease) 09-2012   dx by ENT   . Headache(784.0) last time at least a yr ago   migraine-occasionally  . Osteopenia 2004   Dexa, hip  . Varicose veins     Past Surgical History:  Procedure Laterality Date  . APPENDECTOMY    . COLONOSCOPY    . ORIF SHOULDER FRACTURE Right 11/18/2013   Procedure: OPEN REDUCTION INTERNAL FIXATION (ORIF) RIGHT SHOULDER TUBEROSITY FRACTURE;  Surgeon: Marin Shutter, MD;  Location: [facility];  Service: Orthopedics;  Laterality: Right;  . TONSILLECTOMY    . VARICOSE VEIN SURGERY  2014   laser     There were no vitals filed for this visit.  Subjective Assessment - 02/18/18 1706    Subjective  Patient reports LBP started about 2 months ago. Reports pain is centered over both sides of LB and with intermittent B lateral hip pain that radiates to mid-thigh. R side worse than L. Reports MD informed her that she has arthritis on her lumbar and B hip on xray, and indicated possible sciatica.  Reports aggravation of B hip pain when rolling over in bed onto  her side, prolonged sitting, standing from sitting, standing in one place for a long time.  Ice and heat have been helping. Denies N/T.    Pertinent History  osteopenia, HA, GERD, back pain, ORIF R shoulder fx    How long can you sit comfortably?  15-30 min    How long can you stand comfortably?  30 min    How long can you walk comfortably?  variable     Diagnostic tests  per patient- MD reported arthritis on lumbar and B hip xraxy    Patient Stated Goals  pain relief    Currently in Pain?  Yes    Pain Score  5     Pain Location  Hip    Pain Orientation  Right;Lateral    Pain Descriptors / Indicators  Dull;Constant;Aching    Pain Type  Acute pain         [facility] PT Assessment - 02/18/18 1721      Assessment   Medical Diagnosis  Low back pain    Referring Provider (PT)  Esmond Plants, MD    Onset Date/Surgical Date  12/19/17    Next MD Visit  03/17/18    Prior Therapy  Yes- for  shoulder      Precautions   Precautions  --   osteopenia     Restrictions   Weight Bearing Restrictions  No      Balance Screen   Has the patient fallen in the past 6 months  No    Has the patient had a decrease in activity level because of a fear of falling?   No    Is the patient reluctant to leave their home because of a fear of falling?   No      Home Social worker  Private residence    Living Arrangements  Children    Available Help at Discharge  Family    Type of Home  --   townhome   Home Access  Level entry    [place]  Two level    Alternate Level Stairs-Number of Steps  13    Alternate Level Stairs-Rails  Right      Prior Function   Level of Independence  Independent    Vocation  Full time employment    Vocation Requirements  middle school teacher    Leisure  none      Cognition   Overall Cognitive Status  Within Functional Limits for tasks assessed      Observation/Other Assessments   Focus on Therapeutic Outcomes (FOTO)   Lumbar: 56 (44% limited, 34%  predicted)      Sensation   Light Touch  Appears Intact      Coordination   Gross Motor Movements are Fluid and Coordinated  Yes      Posture/Postural Control   Posture/Postural Control  Postural limitations    Postural Limitations  Forward head      ROM / Strength   AROM / PROM / Strength  AROM;Strength      AROM   AROM Assessment Site  Lumbar;Hip    Right/Left Hip  Right;Left    Right Hip External Rotation   35    Right Hip Internal Rotation   35   4/10 lateral hip pain; worse with OP   Left Hip External Rotation   18    Left Hip Internal Rotation   22    Lumbar Flexion  toes    Lumbar Extension  WFL    Lumbar - Right Side Bend  mid thigh   5-6/10 pain in R laterl hip and LB   Lumbar - Left Side Bend  jt line   4/10 pain in R laterl hip and LB   Lumbar - Right Rotation  WFL    Lumbar - Left Rotation  [facility]      Strength   Strength Assessment Site  Hip;Knee;Ankle    Right/Left Hip  Right;Left    Right Hip Flexion  4/5    Right Hip ABduction  4+/5    Right Hip ADduction  4/5    Left Hip Flexion  4+/5    Left Hip ABduction  4+/5    Left Hip ADduction  4/5    Right/Left Knee  Right;Left    Right Knee Flexion  4+/5    Right Knee Extension  4+/5    Left Knee Flexion  4+/5    Left Knee Extension  4+/5    Right/Left Ankle  Right;Left    Right Ankle Dorsiflexion  4+/5    Right Ankle Plantar Flexion  4+/5    Right Ankle Inversion  4+/5    Left Ankle Dorsiflexion  4+/5    Left  Ankle Plantar Flexion  4+/5      Flexibility   Soft Tissue Assessment /Muscle Length  yes    Hamstrings  R mildly tight    ITB  B mildly tight      Palpation   Palpation comment  B lateral glute, piriformis TTP R> L; no tenderness in LB      Ambulation/Gait   Gait Pattern  Within Functional Limits                           PT Education - 02/18/18 1748    Education Details  prognosis, POC, HEP    Person(s) Educated  Patient    Methods   Explanation;Demonstration;Tactile cues;Verbal cues;Handout    Comprehension  Verbalized understanding;Returned demonstration       PT Short Term Goals - 02/18/18 1755      PT SHORT TERM GOAL #1   Title  Patient to be independent with initial HEP.    Time  3    Period  Weeks    Status  New    Target Date  03/11/18        PT Long Term Goals - 02/18/18 1755      PT LONG TERM GOAL #1   Title  Patient to be independent with advanced HEP.    Time  6    Period  Weeks    Status  New    Target Date  04/01/18      PT LONG TERM GOAL #2   Title  Patient to demonstrate pain-free and WFL lumbar AROM.    Time  6    Period  Weeks    Status  New    Target Date  04/01/18      PT LONG TERM GOAL #3   Title  Patient to demonstrate pain-free and WFL R hip AROM/PROM.    Time  6    Period  Weeks    Status  New    Target Date  04/01/18      PT LONG TERM GOAL #4   Title  Patient to demonstrate 4+/5 strength in B LEs without pain limiting.     Time  6    Period  Weeks    Status  New    Target Date  04/01/18      PT LONG TERM GOAL #5   Title  Patient to report 60% decreased pain when transfering from sit>stand.     Time  6    Period  Weeks    Status  New    Target Date  04/01/18      Additional Long Term Goals   Additional Long Term Goals  Yes      PT LONG TERM GOAL #6   Title  Patient to tolerate 4 hours of R sidelying at night without pain limiting.     Time  6    Period  Weeks    Status  New    Target Date  04/01/18            Plan - 02/18/18 1749    Clinical Impression Statement  Patient is a 62y/o F presenting to OPPT with c/o B LB and lateral hip pain of 2 months duration; R >L. Reports MD informed her of arthritis on lumbar and hip xrays. Pain worse with rolling in bed, prolonged sitting, standing from sitting, standing in one place for a long time. Denies N/T. Patient today with  mildly limited and painful lumbar sidebending AROM, painful R hip IR AROM/PROM,  slightly limited proximal high strength, decreased flexibility, and tenderness to B lateral glute and piriformis- R>L. Patient educated on and received handout of gentle ROM and stretching HEP. Reported understanding. Would benefit from skilled PT services 2x/week for 6 weeks to address aforementioned impairments.     Clinical Presentation  Stable    Clinical Decision Making  Low    Rehab Potential  Good    PT Frequency  2x / week    PT Duration  6 weeks    PT Treatment/Interventions  ADLs/Self Care Home Management;Cryotherapy;Electrical Stimulation;Iontophoresis 4mg /ml Dexamethasone;Moist Heat;Ultrasound;DME Instruction;Gait training;Stair training;Functional mobility training;Therapeutic activities;Therapeutic exercise;Manual techniques;Patient/family education;Neuromuscular re-education;Balance training;Scar mobilization;Passive range of motion;Dry needling;Energy conservation;Splinting;Taping    PT Next Visit Plan  reassess HEP    Consulted and Agree with Plan of Care  Patient       Patient will benefit from skilled therapeutic intervention in order to improve the following deficits and impairments:  Decreased activity tolerance, Decreased strength, Pain, Difficulty walking, Decreased range of motion, Improper body mechanics, Postural dysfunction, Impaired flexibility  Visit Diagnosis: Pain in right hip  Pain in left hip  Acute bilateral low back pain without sciatica  Other symptoms and signs involving the musculoskeletal system     Problem List Patient Active Problem List   Diagnosis Date Noted  . PCP NOTES >>>> 02/14/2015  . Osteopenia 05/16/2014  . GERD (gastroesophageal reflux disease) 09/13/2012  . Annual physical exam 01/11/2011  . Anxiety and depression     Manuela Neptune 02/18/2018, 5:59 PM  [facility] [address]  [place], [place], [address] Phone: [phone]   Fax:  [phone]  Name: Jessica Huang MRN: [id number] Date of Birth: 1955-10-27

## 2018-02-23 ENCOUNTER — Ambulatory Visit: Payer: Commercial Managed Care - PPO | Admitting: Physical Therapy

## 2018-02-23 DIAGNOSIS — R1013 Epigastric pain: Secondary | ICD-10-CM | POA: Diagnosis not present

## 2018-02-26 ENCOUNTER — Ambulatory Visit: Payer: Commercial Managed Care - PPO

## 2018-02-26 DIAGNOSIS — M545 Low back pain, unspecified: Secondary | ICD-10-CM

## 2018-02-26 DIAGNOSIS — M25552 Pain in left hip: Secondary | ICD-10-CM

## 2018-02-26 DIAGNOSIS — M25551 Pain in right hip: Secondary | ICD-10-CM

## 2018-02-26 DIAGNOSIS — R29898 Other symptoms and signs involving the musculoskeletal system: Secondary | ICD-10-CM

## 2018-02-26 NOTE — Therapy (Addendum)
[facility] [address]  [place], [place], [address] Phone: [phone]   Fax:  [phone]  Physical Therapy Treatment  Patient Details  Name: SAHORY NORDLING MRN: [id number] Date of Birth: [birth year] Referring Provider (PT): Esmond Plants, MD   Encounter Date: 02/26/2018  PT End of Session - 02/26/18 1707    Visit Number  2    Number of Visits  13    Date for PT Re-Evaluation  04/01/18    Authorization Type  [employer]    PT Start Time  1702    PT Stop Time  1746    PT Time Calculation (min)  44 min    Activity Tolerance  Patient tolerated treatment well    Behavior During Therapy  [facility] for tasks assessed/performed       Past Medical History:  Diagnosis Date  . Allergy   . Anxiety    takes Xanax daily  . Anxiety and depression    takes Wellbutrin and Prozac daily  . Back pain    buldging disc  . Cataract   . GERD (gastroesophageal reflux disease) 09-2012   dx by ENT   . Headache(784.0) last time at least a yr ago   migraine-occasionally  . Osteopenia 2004   Dexa, hip  . Varicose veins     Past Surgical History:  Procedure Laterality Date  . APPENDECTOMY    . COLONOSCOPY    . ORIF SHOULDER FRACTURE Right 11/18/2013   Procedure: OPEN REDUCTION INTERNAL FIXATION (ORIF) RIGHT SHOULDER TUBEROSITY FRACTURE;  Surgeon: Marin Shutter, MD;  Location: [place];  Service: Orthopedics;  Laterality: Right;  . TONSILLECTOMY    . VARICOSE VEIN SURGERY  2014   laser     There were no vitals filed for this visit.  Subjective Assessment - 02/26/18 1705    Subjective  Pt. noting LBP on R side bothered her most with vacuuming today.  Does feel significant improvement in LBP since starting therapy.  Pt. noting she is no longer waking with R hip pain at night.      Pertinent History  osteopenia, HA, GERD, back pain, ORIF R shoulder fx    Diagnostic tests  per patient- MD reported arthritis on lumbar and B hip xraxy    Patient Stated Goals  pain relief    Currently in Pain?  No/denies    Pain Score  0-No pain   Pt. noting 4/10 pain at worst with vacuuming earlier todday    Pain Location  Back    Pain Orientation  Right;Lower    Pain Descriptors / Indicators  Dull;Aching    Pain Type  Acute pain    Pain Frequency  Intermittent    Aggravating Factors   vacuuming    Pain Relieving Factors  rest    Multiple Pain Sites  No                       [facility] Adult PT Treatment/Exercise - 02/26/18 1716      Self-Care   Self-Care  Other Self-Care Comments    Other Self-Care Comments   Reviewed ball release on wall       Lumbar Exercises: Quadruped   Madcat/Old Horse  10 reps    Madcat/Old Horse Limitations  good overall technique       Knee/Hip Exercises: Stretches   ITB Stretch  Right;Left;1 rep;30 seconds    ITB Stretch Limitations  strap  Piriformis Stretch  Right;30 seconds;2 reps    Piriformis Stretch Limitations  KTOS, Figure-4      Knee/Hip Exercises: Aerobic   Nustep  Lvl 3, 6 min       Knee/Hip Exercises: Seated   Sit to Sand  10 reps   with isometric hip abd/ER into red TB      Knee/Hip Exercises: Supine   Bridges  Both   x 12 reps    Bridges with [employer]  Both;10 reps    Other Supine Knee/Hip Exercises  Hooklying alternating clam shell with red TB at knees x 10 reps each way     Other Supine Knee/Hip Exercises  Hooklying alternating march + abdom. bracing with red TB at knees x 10 reps each way                PT Short Term Goals - 02/26/18 1707      PT SHORT TERM GOAL #1   Title  Patient to be independent with initial HEP.    Time  3    Period  Weeks    Status  On-going        PT Long Term Goals - 02/26/18 1708      PT LONG TERM GOAL #1   Title  Patient to be independent with advanced HEP.    Time  6    Period  Weeks    Status  On-going      PT LONG TERM GOAL #2   Title  Patient to demonstrate pain-free and WFL lumbar AROM.    Time  6     Period  Weeks    Status  On-going      PT LONG TERM GOAL #3   Title  Patient to demonstrate pain-free and WFL R hip AROM/PROM.    Time  6    Period  Weeks    Status  On-going      PT LONG TERM GOAL #4   Title  Patient to demonstrate 4+/5 strength in B LEs without pain limiting.     Time  6    Period  Weeks    Status  On-going      PT LONG TERM GOAL #5   Title  Patient to report 60% decreased pain when transfering from sit>stand.     Time  6    Period  Weeks    Status  On-going      PT LONG TERM GOAL #6   Title  Patient to tolerate 4 hours of R sidelying at night without pain limiting.     Time  6    Period  Weeks    Status  New            Plan - 02/26/18 1709    Clinical Impression Statement  Pt. noting she is able to tolerated sleeping on R side better and no longer waking with pain.  Feels R hip has improved since starting therapy significantly.  Session focusing on lumbopelvic strengthening and LE stretching activities which were well tolerated.  Pt. no longer with significant tenderness in B buttocks today with ball massage on wall and feels she is improving with therapy.  Ended visit pain free thus modalities deferred.      PT Treatment/Interventions  ADLs/Self Care Home Management;Cryotherapy;Electrical Stimulation;Iontophoresis 3m/ml Dexamethasone;Moist Heat;Ultrasound;DME Instruction;Gait training;Stair training;Functional mobility training;Therapeutic activities;Therapeutic exercise;Manual techniques;Patient/family education;Neuromuscular re-education;Balance training;Scar mobilization;Passive range of motion;Dry needling;Energy conservation;Splinting;Taping    Consulted and Agree with Plan of Care  Patient       Patient will benefit from skilled therapeutic intervention in order to improve the following deficits and impairments:  Decreased activity tolerance, Decreased strength, Pain, Difficulty walking, Decreased range of motion, Improper body mechanics, Postural  dysfunction, Impaired flexibility  Visit Diagnosis: Pain in right hip  Pain in left hip  Acute bilateral low back pain without sciatica  Other symptoms and signs involving the musculoskeletal system     Problem List Patient Active Problem List   Diagnosis Date Noted  . PCP NOTES >>>> 02/14/2015  . Osteopenia 05/16/2014  . GERD (gastroesophageal reflux disease) 09/13/2012  . Annual physical exam 01/11/2011  . Anxiety and depression     Bess Harvest, [place] 02/26/18 6:00 PM   [facility] [address]  [place], [place], [address] Phone: [phone]   Fax:  [phone]  Name: ADILENY DELON MRN: [id number] Date of Birth: 1955-08-19  PHYSICAL THERAPY DISCHARGE SUMMARY  Visits from Start of Care: 2  Current functional level related to goals / functional outcomes: Unable to assess; patient requested to cancel all appointments   Remaining deficits: Unable to assess   Education / Equipment: HEP  Plan: Patient agrees to discharge.  Patient goals were not met. Patient is being discharged due to the patient's request.  ?????     Janene Harvey, PT, DPT 03/24/18 3:28 PM

## 2018-02-27 DIAGNOSIS — R9389 Abnormal findings on diagnostic imaging of other specified body structures: Secondary | ICD-10-CM | POA: Diagnosis not present

## 2018-03-02 DIAGNOSIS — R109 Unspecified abdominal pain: Secondary | ICD-10-CM | POA: Diagnosis not present

## 2018-03-02 DIAGNOSIS — R1013 Epigastric pain: Secondary | ICD-10-CM | POA: Diagnosis not present

## 2018-03-02 DIAGNOSIS — K319 Disease of stomach and duodenum, unspecified: Secondary | ICD-10-CM | POA: Diagnosis not present

## 2018-03-05 ENCOUNTER — Encounter: Payer: Commercial Managed Care - PPO | Admitting: Physical Therapy

## 2018-03-10 ENCOUNTER — Encounter: Payer: Commercial Managed Care - PPO | Admitting: Physical Therapy

## 2018-03-16 ENCOUNTER — Encounter: Payer: Commercial Managed Care - PPO | Admitting: Physical Therapy

## 2018-03-19 ENCOUNTER — Encounter: Payer: Commercial Managed Care - PPO | Admitting: Physical Therapy

## 2018-03-23 ENCOUNTER — Encounter: Payer: Commercial Managed Care - PPO | Admitting: Physical Therapy

## 2018-03-30 ENCOUNTER — Ambulatory Visit: Payer: Commercial Managed Care - PPO | Admitting: Internal Medicine

## 2018-03-30 ENCOUNTER — Encounter: Payer: Self-pay | Admitting: Internal Medicine

## 2018-03-30 ENCOUNTER — Ambulatory Visit: Payer: Self-pay | Admitting: *Deleted

## 2018-03-30 VITALS — BP 124/78 | HR 71 | Temp 98.5°F | Resp 16 | Ht 65.0 in | Wt 229.0 lb

## 2018-03-30 DIAGNOSIS — J40 Bronchitis, not specified as acute or chronic: Secondary | ICD-10-CM

## 2018-03-30 MED ORDER — PREDNISONE 10 MG PO TABS: ORAL_TABLET | ORAL | 0 refills | Status: DC

## 2018-03-30 MED ORDER — ALBUTEROL SULFATE HFA 108 (90 BASE) MCG/ACT IN AERS: 2.0000 | INHALATION_SPRAY | Freq: Four times a day (QID) | RESPIRATORY_TRACT | 1 refills | Status: DC | PRN

## 2018-03-30 MED ORDER — HYDROCODONE-HOMATROPINE 5-1.5 MG/5ML PO SYRP: 5.0000 mL | ORAL_SOLUTION | Freq: Every evening | ORAL | 0 refills | Status: DC | PRN

## 2018-03-30 MED ORDER — AZITHROMYCIN 250 MG PO TABS: ORAL_TABLET | ORAL | 0 refills | Status: DC

## 2018-03-30 NOTE — Telephone Encounter (Signed)
Pt called with complaints of bronchitis, hacking cough, green mucus and chest tightness that happens when she coughs; these symptoms started 2 weeks ago; the pt says that she has tried OTC cough syrup but the cough keeps her up at night; she requested to see Dr Larose Kells this afternoon;  recommendations made per nurse triage protocol; pt offered and accepted appointment with Dr Larose Kells, Christine, 03/30/18 at 1520; she verbalized understanding; will route to office for notification of this upcoming appointment.  Reason for Disposition . SEVERE coughing spells (e.g., whooping sound after coughing, vomiting after coughing)  Answer Assessment - Initial Assessment Questions 1. ONSET: "When did the cough begin?"      03/16/18 2. SEVERITY: "How bad is the cough today?"      severe 3. RESPIRATORY DISTRESS: "Describe your breathing."     Breathing ok other than cough 4. FEVER: "Do you have a fever?" If so, ask: "What is your temperature, how was it measured, and when did it start?"     Not checked 5. SPUTUM: "Describe the color of your sputum" (clear, white, yellow, green)     green 6. HEMOPTYSIS: "Are you coughing up any blood?" If so ask: "How much?" (flecks, streaks, tablespoons, etc.)     no 7. CARDIAC HISTORY: "Do you have any history of heart disease?" (e.g., heart attack, congestive heart failure)      no 8. LUNG HISTORY: "Do you have any history of lung disease?"  (e.g., pulmonary embolus, asthma, emphysema)     no 9. PE RISK FACTORS: "Do you have a history of blood clots?" (or: recent major surgery, recent prolonged travel, bedridden)     no 10. OTHER SYMPTOMS: "Do you have any other symptoms?" (e.g., runny nose, wheezing, chest pain)       Runny nose, chest tightness 11. PREGNANCY: "Is there any chance you are pregnant?" "When was your last menstrual period?"       no 12. TRAVEL: "Have you traveled out of the country in the last month?" (e.g., travel history, exposures)       no  Protocols  used: [place]

## 2018-03-30 NOTE — Progress Notes (Signed)
Subjective:    Patient ID: Jessica Huang, female    DOB: February 07, 1956, 62 y.o.   MRN: [id number]  DOS:  03/30/2018 Type of visit - description : Acute  Upper respiratory symptoms that started approximately 2 weeks ago. 5 days ago she was prescribed amoxicillin by a "tele-doc", despite the fact she is allergic to it. She is here because she continue with respiratory symptoms. Fortunately, has not developed any allergic reactions to penicillin.  Review of Systems  Currently: Cough, dry most of the time, + greenish sputum in the mornings only. Some tightness in the chest noted. Also cough is more severe at night Has postnasal dripping. No fever chills No nausea, vomiting, diarrhea Past Medical History:  Diagnosis Date  . Allergy   . Anxiety    takes Xanax daily  . Anxiety and depression    takes Wellbutrin and Prozac daily  . Back pain    buldging disc  . Cataract   . GERD (gastroesophageal reflux disease) 09-2012   dx by ENT   . Headache(784.0) last time at least a yr ago   migraine-occasionally  . Osteopenia 2004   Dexa, hip  . Varicose veins     Past Surgical History:  Procedure Laterality Date  . APPENDECTOMY    . COLONOSCOPY    . ORIF SHOULDER FRACTURE Right 11/18/2013   Procedure: OPEN REDUCTION INTERNAL FIXATION (ORIF) RIGHT SHOULDER TUBEROSITY FRACTURE;  Surgeon: Marin Shutter, MD;  Location: [facility];  Service: Orthopedics;  Laterality: Right;  . TONSILLECTOMY    . VARICOSE VEIN SURGERY  2014   laser     Social History   Socioeconomic History  . Marital status: Divorced    Spouse name: Not on file  . Number of children: 2  . Years of education: Not on file  . Highest education level: Not on file  Occupational History  . Occupation: Civil engineer, contracting, 7th grade  Social Needs  . Financial resource strain: Not on file  . Food insecurity:    Worry: Not on file    Inability: Not on file  . Transportation needs:    Medical: Not on file   Non-medical: Not on file  Tobacco Use  . Smoking status: Never Smoker  . Smokeless tobacco: Never Used  Substance and Sexual Activity  . Alcohol use: Yes    Alcohol/week: 0.0 standard drinks    Comment: socially   . Drug use: No  . Sexual activity: Not Currently    Birth control/protection: Post-menopausal  Lifestyle  . Physical activity:    Days per week: Not on file    Minutes per session: Not on file  . Stress: Not on file  Relationships  . Social connections:    Talks on phone: Not on file    Gets together: Not on file    Attends religious service: Not on file    Active member of club or organization: Not on file    Attends meetings of clubs or organizations: Not on file    Relationship status: Not on file  . Intimate partner violence:    Fear of current or ex partner: Not on file    Emotionally abused: Not on file    Physically abused: Not on file    Forced sexual activity: Not on file  Other Topics Concern  . Not on file  Social History Narrative   divorce, 2 children , one lives at home, daughter got married 10-2015  Allergies as of 03/30/2018      Reactions   Penicillins Rash   CHILDHOOD ALLERGY      Medication List       Accurate as of March 30, 2018  3:24 PM. Always use your most recent med list.        ALPRAZolam 0.5 MG tablet Commonly known as:  XANAX TAKE 1 TABLET BY MOUTH TWICE DAILY AS NEEDED FOR ANXIETY OR SLEEP   buPROPion 150 MG 24 hr tablet Commonly known as:  WELLBUTRIN XL Take 1 tablet (150 mg total) by mouth daily.   CALCIUM 1000 + D PO Take 1,000 mg by mouth daily.   cetirizine 10 MG tablet Commonly known as:  ZYRTEC Take 10 mg by mouth daily.   FLUoxetine 20 MG tablet Commonly known as:  PROZAC Take 2 tablets (40 mg total) by mouth daily.   MULTIVITAMIN ADULT PO Take 1 tablet by mouth daily.   omeprazole 40 MG capsule Commonly known as:  PRILOSEC Take 40 mg by mouth daily.   traMADol 50 MG tablet Commonly known  as:  ULTRAM TAKE 1 TABLET BY MOUTH EVERY 6 HOURS AS NEEDED FOR MODERATE PAIN           Objective:   Physical Exam BP 124/78 (BP Location: Left Arm, Patient Position: Sitting, Cuff Size: Small)   Pulse 71   Temp 98.5 F (36.9 C) (Oral)   Resp 16   Ht 5\' 5"  (1.651 m)   Wt 229 lb (103.9 kg)   SpO2 98%   BMI 38.11 kg/m  General:   Well developed, NAD, BMI noted.  Frequent cough noted. HEENT:  Normocephalic . Face symmetric, atraumatic TMs normal.  Throat symmetric and not red.  Nose congested Lungs:  Few rhonchi with cough, slight increased expiratory time Normal respiratory effort, no intercostal retractions, no accessory muscle use. Heart: RRR,  no murmur.  No pretibial edema bilaterally  Skin: Not pale. Not jaundice Neurologic:  alert & oriented X3.  Speech normal, gait appropriate for age and unassisted Psych--  Cognition and judgment appear intact.  Cooperative with normal attention span and concentration.  Behavior appropriate. No anxious or depressed appearing.      Assessment & Plan:    Assessment Anxiety depression Osteopenia, last dexa 06-2015 MSK: Back pain, hip pain GERD DX by ENT 2014  PLAN Bronchitis, mild bronchospasm?. Recommend to stop amoxicillin, start Zithromax.  Cough control with Mucinex DM and hydrocodone at bedtime per patient request. We will also prescribe a low-dose prednisone for possible bronchospasm. If she is not better, recommend to start albuterol, knows how to use it. Penicillin allergy: Recommend to avoid any penicillin products, see HPI.  Fortunately, this time she did not have any reaction.

## 2018-03-30 NOTE — Patient Instructions (Signed)
Rest, fluids , tylenol  For cough:  Take Mucinex DM twice a day as needed until better For nighttime, take hydrocodone.  Do not mix it with Ultram  If you continue coughing and having chest tightness, use albuterol: 2 puffs 3 times a day.  For nasal congestion: Use OTC Nasocort or Flonase : 2 nasal sprays on each side of the nose in the morning until you feel better   Take the antibiotic as prescribed: Zithromax.  Stop amoxicillin    Call if not gradually better over the next 5 or 6 days  Call anytime if the symptoms are severe

## 2018-03-30 NOTE — Progress Notes (Signed)
Pre visit review using our clinic review tool, if applicable. No additional management support is needed unless otherwise documented below in the visit note. 

## 2018-03-31 NOTE — Assessment & Plan Note (Signed)
  Bronchitis, mild bronchospasm?. Recommend to stop amoxicillin, start Zithromax.  Cough control with Mucinex DM and hydrocodone at bedtime per patient request. We will also prescribe a low-dose prednisone for possible bronchospasm. If she is not better, recommend to start albuterol, knows how to use it. Penicillin allergy: Recommend to avoid any penicillin products, see HPI.  Fortunately, this time she did not have any reaction.

## 2018-04-14 DIAGNOSIS — M9901 Segmental and somatic dysfunction of cervical region: Secondary | ICD-10-CM | POA: Diagnosis not present

## 2018-04-14 DIAGNOSIS — M9902 Segmental and somatic dysfunction of thoracic region: Secondary | ICD-10-CM | POA: Diagnosis not present

## 2018-04-14 DIAGNOSIS — M531 Cervicobrachial syndrome: Secondary | ICD-10-CM | POA: Diagnosis not present

## 2018-06-03 DIAGNOSIS — M9901 Segmental and somatic dysfunction of cervical region: Secondary | ICD-10-CM | POA: Diagnosis not present

## 2018-06-03 DIAGNOSIS — M9902 Segmental and somatic dysfunction of thoracic region: Secondary | ICD-10-CM | POA: Diagnosis not present

## 2018-06-03 DIAGNOSIS — M531 Cervicobrachial syndrome: Secondary | ICD-10-CM | POA: Diagnosis not present

## 2018-06-11 DIAGNOSIS — M25552 Pain in left hip: Secondary | ICD-10-CM | POA: Diagnosis not present

## 2018-06-11 DIAGNOSIS — M7061 Trochanteric bursitis, right hip: Secondary | ICD-10-CM | POA: Diagnosis not present

## 2018-06-11 DIAGNOSIS — M25551 Pain in right hip: Secondary | ICD-10-CM | POA: Diagnosis not present

## 2018-06-25 ENCOUNTER — Other Ambulatory Visit: Payer: Self-pay | Admitting: Internal Medicine

## 2018-06-26 NOTE — Telephone Encounter (Signed)
Pt is requesting refill on alprazolam and tramadol.  Last OV: 03/30/2018 Last Fill on alprazolam: 11/18/2017 #30 and 2RF Last Fill on tramadol: 11/18/2017 #28 and 2RF UDS: 11/07/2016 Low risk

## 2018-07-03 DIAGNOSIS — D251 Intramural leiomyoma of uterus: Secondary | ICD-10-CM | POA: Diagnosis not present

## 2018-07-03 DIAGNOSIS — N950 Postmenopausal bleeding: Secondary | ICD-10-CM | POA: Diagnosis not present

## 2018-07-03 DIAGNOSIS — R9389 Abnormal findings on diagnostic imaging of other specified body structures: Secondary | ICD-10-CM | POA: Diagnosis not present

## 2018-07-06 DIAGNOSIS — N840 Polyp of corpus uteri: Secondary | ICD-10-CM | POA: Diagnosis not present

## 2018-07-06 DIAGNOSIS — N950 Postmenopausal bleeding: Secondary | ICD-10-CM | POA: Diagnosis not present

## 2018-07-07 DIAGNOSIS — L600 Ingrowing nail: Secondary | ICD-10-CM | POA: Diagnosis not present

## 2018-08-10 DIAGNOSIS — Z1329 Encounter for screening for other suspected endocrine disorder: Secondary | ICD-10-CM | POA: Diagnosis not present

## 2018-08-10 DIAGNOSIS — Z130 Encounter for screening for diseases of the blood and blood-forming organs and certain disorders involving the immune mechanism: Secondary | ICD-10-CM | POA: Diagnosis not present

## 2018-08-10 DIAGNOSIS — Z6836 Body mass index (BMI) 36.0-36.9, adult: Secondary | ICD-10-CM | POA: Diagnosis not present

## 2018-08-10 DIAGNOSIS — Z13220 Encounter for screening for lipoid disorders: Secondary | ICD-10-CM | POA: Diagnosis not present

## 2018-08-10 DIAGNOSIS — Z131 Encounter for screening for diabetes mellitus: Secondary | ICD-10-CM | POA: Diagnosis not present

## 2018-08-10 DIAGNOSIS — Z0000 Encounter for general adult medical examination without abnormal findings: Secondary | ICD-10-CM | POA: Diagnosis not present

## 2018-08-10 DIAGNOSIS — Z01419 Encounter for gynecological examination (general) (routine) without abnormal findings: Secondary | ICD-10-CM | POA: Diagnosis not present

## 2018-08-10 DIAGNOSIS — N870 Mild cervical dysplasia: Secondary | ICD-10-CM | POA: Diagnosis not present

## 2018-08-10 LAB — HEPATIC FUNCTION PANEL
ALT: 11 (ref 7–35)
AST: 14 (ref 13–35)
Alkaline Phosphatase: 124 (ref 25–125)
Bilirubin, Total: 0.4

## 2018-08-10 LAB — CBC AND DIFFERENTIAL
HCT: 44 (ref 36–46)
Hemoglobin: 15.2 (ref 12.0–16.0)
Platelets: 232 (ref 150–399)
WBC: 7.9

## 2018-08-10 LAB — BASIC METABOLIC PANEL
BUN: 16 (ref 4–21)
Creatinine: 1.1 (ref 0.5–1.1)
Glucose: 91
Potassium: 4.4 (ref 3.4–5.3)
Sodium: 143 (ref 137–147)

## 2018-08-10 LAB — LIPID PANEL
Cholesterol: 190 (ref 0–200)
HDL: 57 (ref 35–70)
LDL Cholesterol: 119
Triglycerides: 68 (ref 40–160)

## 2018-08-10 LAB — HEMOGLOBIN A1C: Hemoglobin A1C: 5.2

## 2018-08-10 LAB — TSH: TSH: 1.65 (ref 0.41–5.90)

## 2018-08-24 ENCOUNTER — Encounter: Payer: Self-pay | Admitting: Internal Medicine

## 2018-08-25 DIAGNOSIS — Z1231 Encounter for screening mammogram for malignant neoplasm of breast: Secondary | ICD-10-CM | POA: Diagnosis not present

## 2018-08-25 LAB — HM MAMMOGRAPHY

## 2018-08-27 ENCOUNTER — Other Ambulatory Visit: Payer: Self-pay | Admitting: Obstetrics and Gynecology

## 2018-08-28 ENCOUNTER — Encounter: Payer: Self-pay | Admitting: Internal Medicine

## 2018-09-14 ENCOUNTER — Encounter (HOSPITAL_BASED_OUTPATIENT_CLINIC_OR_DEPARTMENT_OTHER): Payer: Self-pay

## 2018-09-14 ENCOUNTER — Other Ambulatory Visit: Payer: Self-pay

## 2018-09-14 NOTE — Progress Notes (Signed)
Spoke with: Abigail Butts NPO:  After Midnight, no gum, candy, or mints   Arrival time: 7:30AM Labs: (CBC COVID in epic) AM medications: None Pre op orders: Yes Ride home:  Ubaldo Glassing (daughter) [phone]

## 2018-09-14 NOTE — Progress Notes (Signed)
SPOKE W/  Jessica Huang     SCREENING SYMPTOMS OF COVID 19:   COUGH--NO  RUNNY NOSE--- NO  SORE THROAT---NO  NASAL CONGESTION----NO  SNEEZING----NO  SHORTNESS OF BREATH---NO  DIFFICULTY BREATHING---NO  TEMP >100.0 -----NO  UNEXPLAINED BODY ACHES------NO  CHILLS -------- NO  HEADACHES ---------NO  LOSS OF SMELL/ TASTE --------NO    HAVE YOU OR ANY FAMILY MEMBER TRAVELLED PAST 14 DAYS OUT OF THE   COUNTY---NO STATE----NO COUNTRY----NO  HAVE YOU OR ANY FAMILY MEMBER BEEN EXPOSED TO ANYONE WITH COVID 19?NO

## 2018-09-15 ENCOUNTER — Encounter (HOSPITAL_COMMUNITY)
Admission: RE | Admit: 2018-09-15 | Discharge: 2018-09-15 | Disposition: A | Discharge status: Home / Self Care | Payer: Commercial Managed Care - PPO | Source: Ambulatory Visit | Attending: Obstetrics and Gynecology | Admitting: Obstetrics and Gynecology

## 2018-09-15 ENCOUNTER — Other Ambulatory Visit (HOSPITAL_COMMUNITY)
Admission: RE | Admit: 2018-09-15 | Discharge: 2018-09-15 | Disposition: A | Discharge status: Home / Self Care | Payer: Commercial Managed Care - PPO | Source: Ambulatory Visit | Attending: Obstetrics and Gynecology | Admitting: Obstetrics and Gynecology

## 2018-09-15 DIAGNOSIS — Z1159 Encounter for screening for other viral diseases: Secondary | ICD-10-CM | POA: Diagnosis present

## 2018-09-15 DIAGNOSIS — N950 Postmenopausal bleeding: Secondary | ICD-10-CM | POA: Diagnosis not present

## 2018-09-15 DIAGNOSIS — N840 Polyp of corpus uteri: Secondary | ICD-10-CM | POA: Diagnosis not present

## 2018-09-15 DIAGNOSIS — F329 Major depressive disorder, single episode, unspecified: Secondary | ICD-10-CM | POA: Diagnosis not present

## 2018-09-15 DIAGNOSIS — F419 Anxiety disorder, unspecified: Secondary | ICD-10-CM | POA: Diagnosis not present

## 2018-09-15 DIAGNOSIS — N858 Other specified noninflammatory disorders of uterus: Secondary | ICD-10-CM | POA: Diagnosis present

## 2018-09-15 LAB — CBC
HCT: 44.9 % (ref 36.0–46.0)
Hemoglobin: 14.9 g/dL (ref 12.0–15.0)
MCH: 30.2 pg (ref 26.0–34.0)
MCHC: 33.2 g/dL (ref 30.0–36.0)
MCV: 91.1 fL (ref 80.0–100.0)
Platelets: 302 10*3/uL (ref 150–400)
RBC: 4.93 MIL/uL (ref 3.87–5.11)
RDW: 12.8 % (ref 11.5–15.5)
WBC: 10.5 10*3/uL (ref 4.0–10.5)
nRBC: 0 % (ref 0.0–0.2)

## 2018-09-16 LAB — NOVEL CORONAVIRUS, NAA (HOSP ORDER, SEND-OUT TO REF LAB; TAT 18-24 HRS): SARS-CoV-2, NAA: NOT DETECTED

## 2018-09-17 NOTE — Progress Notes (Signed)
SPOKE W/  _[name]  Reminded to self quarantine   SCREENING SYMPTOMS OF COVID 19:   COUGH--n  RUNNY NOSE--- n  SORE THROAT---n  NASAL CONGESTION----n  SNEEZING----n  SHORTNESS OF BREATH---n  DIFFICULTY BREATHING---n  TEMP >100.0 -----n  UNEXPLAINED BODY ACHES------n  CHILLS -------- n  HEADACHES ---------n  LOSS OF SMELL/ TASTE --------n    HAVE YOU OR ANY FAMILY MEMBER TRAVELLED PAST 14 DAYS OUT OF THE   COUNTY---n STATE----n COUNTRY----n  HAVE YOU OR ANY FAMILY MEMBER BEEN EXPOSED TO ANYONE WITH COVID 19? n

## 2018-09-17 NOTE — Anesthesia Preprocedure Evaluation (Addendum)
Anesthesia Evaluation  Patient identified by MRN, date of birth, ID band Patient awake    Reviewed: Allergy & Precautions, NPO status , Patient's Chart, lab work & pertinent test results  Airway Mallampati: II  TM Distance: >3 FB Neck ROM: Full    Dental no notable dental hx. (+) Teeth Intact   Pulmonary    Pulmonary exam normal breath sounds clear to auscultation       Cardiovascular Exercise Tolerance: Good negative cardio ROS Normal cardiovascular exam Rhythm:Regular Rate:Normal     Neuro/Psych  Headaches, Anxiety    GI/Hepatic Neg liver ROS, GERD  ,  Endo/Other  negative endocrine ROS  Renal/GU negative Renal ROS     Musculoskeletal   Abdominal (+) + obese,   Peds  Hematology   Anesthesia Other Findings   Reproductive/Obstetrics                            Lab Results  Component Value Date   WBC 10.5 09/15/2018   HGB 14.9 09/15/2018   HCT 44.9 09/15/2018   MCV 91.1 09/15/2018   PLT 302 09/15/2018   Lab Results  Component Value Date   CREATININE 1.1 08/10/2018   BUN 16 08/10/2018   NA 143 08/10/2018   K 4.4 08/10/2018   CL 104 11/16/2013   CO2 21 11/16/2013     Anesthesia Physical Anesthesia Plan  ASA: II  Anesthesia Plan: General   Post-op Pain Management:    Induction: Intravenous  PONV Risk Score and Plan: 4 or greater and Treatment may vary due to age or medical condition, Ondansetron and Dexamethasone  Airway Management Planned: LMA  Additional Equipment:   Intra-op Plan:   Post-operative Plan:   Informed Consent: I have reviewed the patients History and Physical, chart, labs and discussed the procedure including the risks, benefits and alternatives for the proposed anesthesia with the patient or authorized representative who has indicated his/her understanding and acceptance.     Dental advisory given  Plan Discussed with:   Anesthesia Plan  Comments:        Anesthesia Quick Evaluation

## 2018-09-18 ENCOUNTER — Other Ambulatory Visit: Payer: Self-pay

## 2018-09-18 ENCOUNTER — Encounter (HOSPITAL_BASED_OUTPATIENT_CLINIC_OR_DEPARTMENT_OTHER): Payer: Self-pay | Admitting: Emergency Medicine

## 2018-09-18 ENCOUNTER — Ambulatory Visit (HOSPITAL_BASED_OUTPATIENT_CLINIC_OR_DEPARTMENT_OTHER)
Admission: RE | Admit: 2018-09-18 | Discharge: 2018-09-18 | Disposition: A | Discharge status: Home / Self Care | Payer: Commercial Managed Care - PPO | Attending: Obstetrics and Gynecology | Admitting: Obstetrics and Gynecology

## 2018-09-18 ENCOUNTER — Ambulatory Visit (HOSPITAL_BASED_OUTPATIENT_CLINIC_OR_DEPARTMENT_OTHER): Payer: Commercial Managed Care - PPO | Admitting: Anesthesiology

## 2018-09-18 ENCOUNTER — Encounter (HOSPITAL_BASED_OUTPATIENT_CLINIC_OR_DEPARTMENT_OTHER)
Admission: RE | Disposition: A | Discharge status: Home / Self Care | Payer: Self-pay | Source: Home / Self Care | Attending: Obstetrics and Gynecology

## 2018-09-18 DIAGNOSIS — F329 Major depressive disorder, single episode, unspecified: Secondary | ICD-10-CM | POA: Insufficient documentation

## 2018-09-18 DIAGNOSIS — N950 Postmenopausal bleeding: Secondary | ICD-10-CM | POA: Insufficient documentation

## 2018-09-18 DIAGNOSIS — N840 Polyp of corpus uteri: Secondary | ICD-10-CM | POA: Insufficient documentation

## 2018-09-18 DIAGNOSIS — F419 Anxiety disorder, unspecified: Secondary | ICD-10-CM | POA: Insufficient documentation

## 2018-09-18 HISTORY — PX: DILATATION & CURETTAGE/HYSTEROSCOPY WITH MYOSURE: SHX6511

## 2018-09-18 HISTORY — DX: Postmenopausal bleeding: N95.0

## 2018-09-18 SURGERY — DILATATION & CURETTAGE/HYSTEROSCOPY WITH MYOSURE
Anesthesia: General

## 2018-09-18 MED ORDER — MEPERIDINE HCL 25 MG/ML IJ SOLN
6.2500 mg | INTRAMUSCULAR | Status: DC | PRN
  Filled 2018-09-18: qty 1

## 2018-09-18 MED ORDER — GABAPENTIN 300 MG PO CAPS
300.0000 mg | ORAL_CAPSULE | Freq: Once | ORAL | Status: AC
  Administered 2018-09-18: 300 mg via ORAL
  Filled 2018-09-18: qty 1

## 2018-09-18 MED ORDER — PROPOFOL 10 MG/ML IV BOLUS
INTRAVENOUS | Status: DC | PRN
  Administered 2018-09-18: 180 mg via INTRAVENOUS

## 2018-09-18 MED ORDER — ACETAMINOPHEN 500 MG PO TABS
1000.0000 mg | ORAL_TABLET | Freq: Once | ORAL | Status: AC
  Administered 2018-09-18: 1000 mg via ORAL
  Filled 2018-09-18: qty 2

## 2018-09-18 MED ORDER — IBUPROFEN 800 MG PO TABS: 800.0000 mg | ORAL_TABLET | Freq: Three times a day (TID) | ORAL | 0 refills | Status: DC | PRN

## 2018-09-18 MED ORDER — HYDROCODONE-ACETAMINOPHEN 7.5-325 MG PO TABS
1.0000 | ORAL_TABLET | Freq: Once | ORAL | Status: DC | PRN
  Filled 2018-09-18: qty 1

## 2018-09-18 MED ORDER — ACETAMINOPHEN 500 MG PO TABS
ORAL_TABLET | ORAL | Status: AC
  Filled 2018-09-18: qty 2

## 2018-09-18 MED ORDER — DEXAMETHASONE SODIUM PHOSPHATE 10 MG/ML IJ SOLN
INTRAMUSCULAR | Status: AC
  Filled 2018-09-18: qty 1

## 2018-09-18 MED ORDER — GABAPENTIN 300 MG PO CAPS
ORAL_CAPSULE | ORAL | Status: AC
  Filled 2018-09-18: qty 1

## 2018-09-18 MED ORDER — ONDANSETRON HCL 4 MG/2ML IJ SOLN
4.0000 mg | Freq: Once | INTRAMUSCULAR | Status: DC | PRN
  Filled 2018-09-18: qty 2

## 2018-09-18 MED ORDER — KETOROLAC TROMETHAMINE 30 MG/ML IJ SOLN
30.0000 mg | Freq: Once | INTRAMUSCULAR | Status: DC | PRN
  Filled 2018-09-18: qty 1

## 2018-09-18 MED ORDER — ONDANSETRON HCL 4 MG/2ML IJ SOLN
INTRAMUSCULAR | Status: AC
  Filled 2018-09-18: qty 2

## 2018-09-18 MED ORDER — FENTANYL CITRATE (PF) 100 MCG/2ML IJ SOLN
INTRAMUSCULAR | Status: DC | PRN
  Administered 2018-09-18: 50 ug via INTRAVENOUS
  Administered 2018-09-18 (×2): 25 ug via INTRAVENOUS

## 2018-09-18 MED ORDER — LIDOCAINE 2% (20 MG/ML) 5 ML SYRINGE
INTRAMUSCULAR | Status: AC
  Filled 2018-09-18: qty 5

## 2018-09-18 MED ORDER — MIDAZOLAM HCL 2 MG/2ML IJ SOLN
INTRAMUSCULAR | Status: AC
  Filled 2018-09-18: qty 2

## 2018-09-18 MED ORDER — GABAPENTIN 100 MG PO CAPS
100.0000 mg | ORAL_CAPSULE | Freq: Once | ORAL | Status: DC
  Filled 2018-09-18: qty 1

## 2018-09-18 MED ORDER — KETOROLAC TROMETHAMINE 30 MG/ML IJ SOLN
INTRAMUSCULAR | Status: DC | PRN
  Administered 2018-09-18: 30 mg via INTRAVENOUS
  Administered 2018-09-18: 30 mg via INTRAMUSCULAR

## 2018-09-18 MED ORDER — DEXAMETHASONE SODIUM PHOSPHATE 10 MG/ML IJ SOLN
INTRAMUSCULAR | Status: DC | PRN
  Administered 2018-09-18: 10 mg via INTRAVENOUS

## 2018-09-18 MED ORDER — SODIUM CHLORIDE 0.9 % IR SOLN
Status: DC | PRN
  Administered 2018-09-18: 1

## 2018-09-18 MED ORDER — KETOROLAC TROMETHAMINE 30 MG/ML IJ SOLN
INTRAMUSCULAR | Status: AC
  Filled 2018-09-18: qty 2

## 2018-09-18 MED ORDER — ONDANSETRON HCL 4 MG/2ML IJ SOLN
INTRAMUSCULAR | Status: DC | PRN
  Administered 2018-09-18: 4 mg via INTRAVENOUS

## 2018-09-18 MED ORDER — LIDOCAINE 2% (20 MG/ML) 5 ML SYRINGE
INTRAMUSCULAR | Status: DC | PRN
  Administered 2018-09-18: 100 mg via INTRAVENOUS

## 2018-09-18 MED ORDER — FENTANYL CITRATE (PF) 100 MCG/2ML IJ SOLN
INTRAMUSCULAR | Status: AC
  Filled 2018-09-18: qty 2

## 2018-09-18 MED ORDER — PROPOFOL 10 MG/ML IV BOLUS
INTRAVENOUS | Status: AC
  Filled 2018-09-18: qty 20

## 2018-09-18 MED ORDER — LACTATED RINGERS IV SOLN
INTRAVENOUS | Status: DC
  Administered 2018-09-18: 08:00:00 via INTRAVENOUS
  Filled 2018-09-18: qty 1000

## 2018-09-18 MED ORDER — MIDAZOLAM HCL 5 MG/5ML IJ SOLN
INTRAMUSCULAR | Status: DC | PRN
  Administered 2018-09-18: 2 mg via INTRAVENOUS

## 2018-09-18 MED ORDER — HYDROMORPHONE HCL 1 MG/ML IJ SOLN
0.2500 mg | INTRAMUSCULAR | Status: DC | PRN
  Filled 2018-09-18: qty 0.5

## 2018-09-18 SURGICAL SUPPLY — 25 items
BIPOLAR CUTTING LOOP 21FR (ELECTRODE)
CANISTER SUCT 3000ML PPV (MISCELLANEOUS) ×1 IMPLANT
CATH ROBINSON RED A/P 16FR (CATHETERS) IMPLANT
COVER WAND RF STERILE (DRAPES) ×1 IMPLANT
DEVICE MYOSURE LITE (MISCELLANEOUS) IMPLANT
DEVICE MYOSURE REACH (MISCELLANEOUS) ×1 IMPLANT
DILATOR CANAL MILEX (MISCELLANEOUS) IMPLANT
GAUZE 4X4 16PLY RFD (DISPOSABLE) ×2 IMPLANT
GLOVE BIOGEL PI IND STRL 7.0 (GLOVE) ×1 IMPLANT
GLOVE BIOGEL PI INDICATOR 7.0 (GLOVE) ×1
GLOVE ECLIPSE 6.5 STRL STRAW (GLOVE) ×2 IMPLANT
GOWN STRL REUS W/TWL LRG LVL3 (GOWN DISPOSABLE) ×2 IMPLANT
IV NS IRRIG 3000ML ARTHROMATIC (IV SOLUTION) ×2 IMPLANT
KIT PROCEDURE FLUENT (KITS) ×2 IMPLANT
KIT TURNOVER CYSTO (KITS) ×2 IMPLANT
LOOP CUTTING BIPOLAR 21FR (ELECTRODE) IMPLANT
MYOSURE XL FIBROID (MISCELLANEOUS)
PACK VAGINAL MINOR WOMEN LF (CUSTOM PROCEDURE TRAY) ×2 IMPLANT
PAD OB MATERNITY 4.3X12.25 (PERSONAL CARE ITEMS) ×2 IMPLANT
PAD PREP 24X48 CUFFED NSTRL (MISCELLANEOUS) ×2 IMPLANT
SEAL CERVICAL OMNI LOK (ABLATOR) IMPLANT
SEAL ROD LENS SCOPE MYOSURE (ABLATOR) ×2 IMPLANT
SYSTEM TISS REMOVAL MYOSURE XL (MISCELLANEOUS) IMPLANT
TOWEL OR 17X26 10 PK STRL BLUE (TOWEL DISPOSABLE) ×2 IMPLANT
WATER STERILE IRR 500ML POUR (IV SOLUTION) ×1 IMPLANT

## 2018-09-18 NOTE — Transfer of Care (Signed)
Immediate Anesthesia Transfer of Care Note  Patient: Jessica Huang  Procedure(s) Performed: DILATATION & CURETTAGE/HYSTEROSCOPY WITH MYOSURE (N/A )  Patient Location: PACU  Anesthesia Type:General  Level of Consciousness: awake, alert  and oriented  Airway & Oxygen Therapy: Patient Spontanous Breathing and Patient connected to face mask oxygen  Post-op Assessment: Report given to RN and Post -op Vital signs reviewed and stable  Post vital signs: Reviewed and stable  Last Vitals:  Vitals Value Taken Time  BP 122/66 09/18/2018  9:23 AM  Temp    Pulse 66 09/18/2018  9:25 AM  Resp 12 09/18/2018  9:25 AM  SpO2 98 % 09/18/2018  9:25 AM  Vitals shown include unvalidated device data.  Last Pain:  Vitals:   09/18/18 0738  TempSrc: Oral      Patients Stated Pain Goal: 3 (98/47/30 8569)  Complications: No apparent anesthesia complications

## 2018-09-18 NOTE — Anesthesia Postprocedure Evaluation (Signed)
Anesthesia Post Note  Patient: Jessica Huang  Procedure(s) Performed: DILATATION & CURETTAGE/HYSTEROSCOPY WITH MYOSURE (N/A )     Patient location during evaluation: PACU Anesthesia Type: General Level of consciousness: awake and alert Pain management: pain level controlled Vital Signs Assessment: post-procedure vital signs reviewed and stable Respiratory status: spontaneous breathing, nonlabored ventilation, respiratory function stable and patient connected to nasal cannula oxygen Cardiovascular status: blood pressure returned to baseline and stable Postop Assessment: no apparent nausea or vomiting Anesthetic complications: no    Last Vitals:  Vitals:   09/18/18 1015 09/18/18 1127  BP: (!) 165/96 (!) 160/65  Pulse: (!) 54 (!) 58  Resp: 14 16  Temp:  36.5 C  SpO2: 98% 98%    Last Pain:  Vitals:   09/18/18 1127  TempSrc:   PainSc: 0-No pain                 Barnet Glasgow

## 2018-09-18 NOTE — Discharge Instructions (Signed)
CALL  IF TEMP>100.4, NOTHING PER VAGINA X 2 WK, CALL IF SOAKING A MAXI  PAD EVERY HOUR OR MORE FREQUENTLY    D & C Home care Instructions:   Personal hygiene:  Used sanitary napkins for vaginal drainage not tampons. Shower or tub bathe the day after your procedure. No douching until bleeding stops. Always wipe from front to back after  Elimination.  Activity: Do not drive or operate any equipment today. The effects of the anesthesia are still present and drowsiness may result. Rest today, not necessarily flat bed rest, just take it easy. You may resume your normal activity in one to 2 days.  Sexual activity: No intercourse for one week or as indicated by your physician  Diet: Eat a light diet as desired this evening. You may resume a regular diet tomorrow.  Return to work: One to 2 days.  General Expectations of your surgery: Vaginal bleeding should be no heavier than a normal period. Spotting may continue up to 10 days. Mild cramps may continue for a couple of days. You may have a regular period in 2-6 weeks.  Unexpected observations call your doctor if these occur: persistent or heavy bleeding. Severe abdominal cramping or pain. Elevation of temperature greater than 100F.  Call for an appointment in one week.    Patient's Signature_______________________________________________________  Nurse's Signature________________________________________________________ Post Anesthesia Home Care Instructions  Activity: Get plenty of rest for the remainder of the day. A responsible adult should stay with you for 24 hours following the procedure.  For the next 24 hours, DO NOT: -Drive a car -Paediatric nurse -Drink alcoholic beverages -Take any medication unless instructed by your physician -Make any legal decisions or sign important papers.  Meals: Start with liquid foods such as gelatin or soup. Progress to regular foods as tolerated. Avoid greasy, spicy, heavy foods. If nausea  and/or vomiting occur, drink only clear liquids until the nausea and/or vomiting subsides. Call your physician if vomiting continues.  Special Instructions/Symptoms: Your throat may feel dry or sore from the anesthesia or the breathing tube placed in your throat during surgery. If this causes discomfort, gargle with warm salt water. The discomfort should disappear within 24 hours.  If you had a scopolamine patch placed behind your ear for the management of post- operative nausea and/or vomiting:  1. The medication in the patch is effective for 72 hours, after which it should be removed.  Wrap patch in a tissue and discard in the trash. Wash hands thoroughly with soap and water. 2. You may remove the patch earlier than 72 hours if you experience unpleasant side effects which may include dry mouth, dizziness or visual disturbances. 3. Avoid touching the patch. Wash your hands with soap and water after contact with the patch.

## 2018-09-18 NOTE — Brief Op Note (Signed)
09/18/2018  9:25 AM  PATIENT:  Jessica Huang  63 y.o. female  PRE-OPERATIVE DIAGNOSIS:  Postmenopausal Bleeding, Endometrial Mass  POST-OPERATIVE DIAGNOSIS:  Postmenopausal Bleeding, Endometrial polyps  PROCEDURE:  Diagnostic hysteroscopy, hysteroscopic resection of endometrial polyps, dilation and curettage  SURGEON:  Surgeon(s) and Role:    * Servando Salina, MD - Primary  PHYSICIAN ASSISTANT:   ASSISTANTS: none   ANESTHESIA:   general Findings: ant polyp, fundal/ant polyp, atrophic endometrium, tubal ostia seen, nl endocervical canal EBL:  5 mL   BLOOD ADMINISTERED:none  DRAINS: none   LOCAL MEDICATIONS USED:  NONE  SPECIMEN:  Source of Specimen:  emc with polyps  DISPOSITION OF SPECIMEN:  PATHOLOGY  COUNTS:  YES  TOURNIQUET:  * No tourniquets in log *  DICTATION: .Other Dictation: Dictation Number [id number]  PLAN OF CARE: Discharge to home after PACU  PATIENT DISPOSITION:  PACU - hemodynamically stable.   Delay start of Pharmacological VTE agent (>24hrs) due to surgical blood loss or risk of bleeding: no

## 2018-09-18 NOTE — Anesthesia Procedure Notes (Signed)
Procedure Name: LMA Insertion Date/Time: 09/18/2018 9:02 AM Performed by: Genelle Bal, CRNA Pre-anesthesia Checklist: Patient identified, Emergency Drugs available, Suction available and Patient being monitored Patient Re-evaluated:Patient Re-evaluated prior to induction Oxygen Delivery Method: Circle system utilized Preoxygenation: Pre-oxygenation with 100% oxygen Induction Type: IV induction Ventilation: Mask ventilation without difficulty LMA: LMA inserted LMA Size: 4.0 Number of attempts: 1 Airway Equipment and Method: Bite block Placement Confirmation: positive ETCO2 Tube secured with: Tape Dental Injury: Teeth and Oropharynx as per pre-operative assessment

## 2018-09-18 NOTE — H&P (Signed)
Jessica Huang is an 62 y.o. female.[name] here for dx hysteroscopy, D&C, resection of endometrial mass noted on sono hysterogram done for endometrial thickening in Jessica setting of PMB  Pertinent Gynecological History: Menses: post-menopausal Bleeding: post menopausal bleeding Contraception: none DES exposure: denies Blood transfusions: none Sexually transmitted diseases: no past history Previous GYN Procedures: n/Jessica  Last mammogram: normal Date: 2019 Last pap: normal Date: 2019 OB History: G2P2   Menstrual History: Menarche age: n/Jessica No LMP recorded. Patient is postmenopausal.    Past Medical History:  Diagnosis Date  . Allergy   . Anxiety    takes Xanax daily  . Anxiety and depression    takes Wellbutrin and Prozac daily  . Back pain    buldging disc  . Cataract    early  . GERD (gastroesophageal reflux disease) 09-2012   dx by ENT   . Headache(784.0) last time at least Jessica yr ago   migraine-occasionally  . Osteopenia 2004   Dexa, hip  . PMB (postmenopausal bleeding)   . Varicose veins     Past Surgical History:  Procedure Laterality Date  . APPENDECTOMY    . COLONOSCOPY    . ORIF SHOULDER FRACTURE Right 11/18/2013   Procedure: OPEN REDUCTION INTERNAL FIXATION (ORIF) RIGHT SHOULDER TUBEROSITY FRACTURE;  Surgeon: Marin Shutter, MD;  Location: [place];  Service: Orthopedics;  Laterality: Right;  . TONSILLECTOMY    . VARICOSE VEIN SURGERY  2014   laser     Family History  Problem Relation Age of Onset  . Other Mother        valve replacement--M  . Breast cancer Mother        M late 52s  . Heart failure Mother        M  . COPD Father        smoker  . Diabetes Other        GPs , late onset  . Colon polyps Sister   . Heart attack Neg Hx   . Colon cancer Neg Hx   . Esophageal cancer Neg Hx   . Stomach cancer Neg Hx   . Rectal cancer Neg Hx     Social History:  reports that she has never smoked. She has never used smokeless tobacco. She reports previous  alcohol use. She reports that she does not use drugs.  Allergies:  Allergies  Allergen Reactions  . Penicillins Rash    CHILDHOOD ALLERGY    No medications prior to admission.    Review of Systems  All other systems reviewed and are negative.   Height 5\' 5"  (1.651 m), weight 99.8 kg. Physical Exam  Constitutional: She is oriented to person, place, and time. She appears well-developed and well-nourished.  HENT:  Head: Atraumatic.  Neck: Neck supple.  Cardiovascular: Regular rhythm.  Respiratory: Effort normal.  GI: Soft.  Genitourinary:    Vagina and uterus normal.     Genitourinary Comments: Cervix nl Adnexa nl   Neurological: She is alert and oriented to person, place, and time.  Skin: Skin is warm and dry.  Psychiatric: She has Jessica normal mood and affect.    No results found for this or any previous visit (from the past 24 hour(s)).  No results found.  Assessment/Plan: PMB Endometrial mass P) dx hysteroscopy, D&C, resection of endometrial mass. Risk of surgery reviewed including infection, bleeding, uterine perforation (04/998) and its risk, thermal injury, fluid overload and its mgmt, injury to surrounding organ structures. All ?  answered  Jessica Huang Jessica Huang 09/18/2018,

## 2018-09-18 NOTE — Op Note (Signed)
Jessica Huang, PUZIO MEDICAL RECORD IO:[id number] ACCOUNT [id number] DATE OF BIRTH:Oct 19, 1955 FACILITY: [facility] LOCATION: [facility] PHYSICIAN:Khristin Keleher A. Yasir Kitner, MD  OPERATIVE REPORT  DATE OF PROCEDURE:  09/18/2018  PREOPERATIVE DIAGNOSES:  Postmenopausal bleeding, endometrial polyp.  PROCEDURE PERFORMED:  Diagnostic hysteroscopy, hysteroscopic resection of endometrial polyps, dilation and curettage.  POSTOPERATIVE DIAGNOSES:  Postmenopausal bleeding, endometrial polyps.  ANESTHESIA:  General.  SURGEON:  Servando Salina, MD  ASSISTANT:  None.  DESCRIPTION OF PROCEDURE:  Under adequate general anesthesia, the patient was placed in the dorsal lithotomy position.  She was sterilely prepped and draped in the usual fashion.  The patient voided prior to entering the room.  Examination under anesthesia revealed an anteverted uterus.  No adnexal masses palpable.  Bivalve speculum was placed in the vagina.  A single-tooth tenaculum was placed on the anterior lip of the cervix.  The cervix easily accepted a #19 Pratt dilator.  A diagnostic hysteroscope was introduced into the uterine cavity.  Both tubal ostia were seen.  There was a fundal anterior polyp as well as the anterior mid body polyp.  Using the Reach resectoscope, both polyps in the endometrial cavity as well as any thickening were resected.  When all  polypoid lesions and endometrial thickening were resected and the endocervical canal was inspected, no lesions were noted.  All instruments were then removed from the vagina.  SPECIMEN:  Labeled endometrial curettings with polyps was sent to pathology.  ESTIMATED BLOOD LOSS: 5 ml.  FLUID DEFICIT: 120 ml.  COMPLICATIONS:  None.  DISPOSITION:  The patient tolerated the procedure well and was transferred to the recovery room in stable condition.  LN/NUANCE  D:09/18/2018 T:09/18/2018 JOB:[id number]

## 2018-09-21 ENCOUNTER — Encounter (HOSPITAL_BASED_OUTPATIENT_CLINIC_OR_DEPARTMENT_OTHER): Payer: Self-pay | Admitting: Obstetrics and Gynecology

## 2018-11-05 ENCOUNTER — Encounter: Payer: Self-pay | Admitting: Internal Medicine

## 2018-11-05 MED ORDER — FLUOXETINE HCL 20 MG PO TABS: 20.0000 mg | ORAL_TABLET | Freq: Every day | ORAL | 0 refills | Status: DC

## 2018-11-09 ENCOUNTER — Other Ambulatory Visit: Payer: Self-pay | Admitting: Family Medicine

## 2018-11-10 NOTE — Telephone Encounter (Signed)
Patient called again to check on her medication request.

## 2018-11-10 NOTE — Telephone Encounter (Signed)
Requesting: Xanax Contract: 11/07/2016 UDS: 11/07/2016, low risk, next screen 05/02/2017 Last OV: 03/30/2018 Next OV: 11/16/2018 Last Refill: 06/26/2018, #30--0 RF Database:   Please advise

## 2018-11-10 NOTE — Telephone Encounter (Signed)
Sent, let pt know

## 2018-11-16 ENCOUNTER — Encounter: Payer: Self-pay | Admitting: Internal Medicine

## 2018-11-16 ENCOUNTER — Other Ambulatory Visit: Payer: Self-pay

## 2018-11-16 ENCOUNTER — Ambulatory Visit (INDEPENDENT_AMBULATORY_CARE_PROVIDER_SITE_OTHER): Payer: Commercial Managed Care - PPO | Admitting: Internal Medicine

## 2018-11-16 VITALS — BP 169/74 | HR 67 | Temp 98.4°F | Resp 16 | Ht 65.0 in | Wt 211.4 lb

## 2018-11-16 DIAGNOSIS — F419 Anxiety disorder, unspecified: Secondary | ICD-10-CM | POA: Diagnosis not present

## 2018-11-16 DIAGNOSIS — F32A Depression, unspecified: Secondary | ICD-10-CM

## 2018-11-16 DIAGNOSIS — F329 Major depressive disorder, single episode, unspecified: Secondary | ICD-10-CM

## 2018-11-16 DIAGNOSIS — Z0000 Encounter for general adult medical examination without abnormal findings: Secondary | ICD-10-CM | POA: Diagnosis not present

## 2018-11-16 DIAGNOSIS — Z23 Encounter for immunization: Secondary | ICD-10-CM

## 2018-11-16 DIAGNOSIS — Z79899 Other long term (current) drug therapy: Secondary | ICD-10-CM

## 2018-11-16 MED ORDER — FLUOXETINE HCL 20 MG PO TABS: 30.0000 mg | ORAL_TABLET | Freq: Every day | ORAL | 3 refills | Status: DC

## 2018-11-16 MED ORDER — ALPRAZOLAM 0.5 MG PO TABS: 0.2500 mg | ORAL_TABLET | Freq: Three times a day (TID) | ORAL | 0 refills | Status: DC | PRN

## 2018-11-16 MED ORDER — BUPROPION HCL ER (XL) 150 MG PO TB24: 150.0000 mg | ORAL_TABLET | Freq: Every day | ORAL | 3 refills | Status: DC

## 2018-11-16 NOTE — Progress Notes (Signed)
Pre visit review using our clinic review tool, if applicable. No additional management support is needed unless otherwise documented below in the visit note. 

## 2018-11-16 NOTE — Assessment & Plan Note (Signed)
Anxiety depression: Symptoms increased lately, work-related : she is in her 25s and has been asked to go back to school but not virtually and she is worried about COVID-19 exposure. She was off medications for a while and doing well, however fluoxetine was restarted 10-2018, currently 20 mg, Will  increased to 30 mg. Will restart Wellbutrin as well.  See AVS On Xanax, RF sent. Reassess in 4 to 5 weeks  Elevated BP: Today BP is slightly elevated, she is stressed, recommend ambulatory BPs. Osteopenia: Currently on calcium, vitamin D. RTC 4 to 5 weeks

## 2018-11-16 NOTE — Assessment & Plan Note (Addendum)
-  Td 11/2018 -  zostavax 2017 - shingrex discussed again, rec to wait till 63 y/o  - early flu shot recommended --Cscope neg 1-09, cscope 09/2017 next per GI -Female care: sees gynecology regularly; (PAP) (-) 07/2017; MMG 08/2018 wnl per pt -Labs from 07/2018-2019 @ gyn: A1c 5.2.  CMP, CBC, TSH normal.  Total cholesterol 190, LDL 119 -Diet and exercise discussed

## 2018-11-16 NOTE — Progress Notes (Signed)
Subjective:    Patient ID: Jessica Huang, female    DOB: 08-10-1955, 63 y.o.   MRN: [id number]  DOS:  11/16/2018 Type of visit - description: CPX Physically feeling well, anxiety has returned, see previous chart entries. Symptoms are work related.    Review of Systems Specifically denies suicidal ideas, admits to some depression and poor sleep  Other than above, a 14 point review of systems is negative     Past Medical History:  Diagnosis Date  . Allergy   . Anxiety    takes Xanax daily  . Anxiety and depression    takes Wellbutrin and Prozac daily  . Back pain    buldging disc  . Cataract    early  . GERD (gastroesophageal reflux disease) 09-2012   dx by ENT   . Headache(784.0) last time at least a yr ago   migraine-occasionally  . Osteopenia 2004   Dexa, hip  . PMB (postmenopausal bleeding)   . Varicose veins     Past Surgical History:  Procedure Laterality Date  . APPENDECTOMY    . COLONOSCOPY    . DILATATION & CURETTAGE/HYSTEROSCOPY WITH MYOSURE N/A 09/18/2018   Procedure: DILATATION & CURETTAGE/HYSTEROSCOPY WITH MYOSURE;  Surgeon: Servando Salina, MD;  Location: [place];  Service: Gynecology;  Laterality: N/A;  . ORIF SHOULDER FRACTURE Right 11/18/2013   Procedure: OPEN REDUCTION INTERNAL FIXATION (ORIF) RIGHT SHOULDER TUBEROSITY FRACTURE;  Surgeon: Marin Shutter, MD;  Location: [place];  Service: Orthopedics;  Laterality: Right;  . TONSILLECTOMY    . VARICOSE VEIN SURGERY  2014   laser     Social History   Socioeconomic History  . Marital status: Divorced    Spouse name: Not on file  . Number of children: 2  . Years of education: Not on file  . Highest education level: Not on file  Occupational History  . Occupation: Civil engineer, contracting, 7th grade  Social Needs  . Financial resource strain: Not on file  . Food insecurity    Worry: Not on file    Inability: Not on file  . Transportation needs    Medical: Not on file   Non-medical: Not on file  Tobacco Use  . Smoking status: Never Smoker  . Smokeless tobacco: Never Used  Substance and Sexual Activity  . Alcohol use: Not Currently    Alcohol/week: 0.0 standard drinks    Comment: socially   . Drug use: No  . Sexual activity: Not Currently    Birth control/protection: Post-menopausal  Lifestyle  . Physical activity    Days per week: Not on file    Minutes per session: Not on file  . Stress: Not on file  Relationships  . Social Herbalist on phone: Not on file    Gets together: Not on file    Attends religious service: Not on file    Active member of club or organization: Not on file    Attends meetings of clubs or organizations: Not on file    Relationship status: Not on file  . Intimate partner violence    Fear of current or ex partner: Not on file    Emotionally abused: Not on file    Physically abused: Not on file    Forced sexual activity: Not on file  Other Topics Concern  . Not on file  Social History Narrative   divorce, 2 children , one lives at home, daughter got married 10-2015  Family History  Problem Relation Age of Onset  . Other Mother        valve replacement--M  . Breast cancer Mother        M late 56s  . Heart failure Mother        M  . COPD Father        smoker  . Diabetes Other        GPs , late onset  . Colon polyps Sister   . Heart attack Neg Hx   . Colon cancer Neg Hx   . Esophageal cancer Neg Hx   . Stomach cancer Neg Hx   . Rectal cancer Neg Hx      Allergies as of 11/16/2018      Reactions   Penicillins Rash   CHILDHOOD ALLERGY      Medication List       Accurate as of November 16, 2018  5:01 PM. If you have any questions, ask your nurse or doctor.        STOP taking these medications   albuterol 108 (90 Base) MCG/ACT inhaler Commonly known as: Ventolin HFA Stopped by: Kathlene November, MD     TAKE these medications   ALPRAZolam 0.5 MG tablet Commonly known as: XANAX Take 0.5-1  tablets (0.25-0.5 mg total) by mouth 3 (three) times daily as needed for anxiety or sleep. What changed: See the new instructions. Changed by: Kathlene November, MD   buPROPion 150 MG 24 hr tablet Commonly known as: WELLBUTRIN XL Take 1 tablet (150 mg total) by mouth daily. Started by: Kathlene November, MD   CALCIUM 1000 + D PO Take 1,000 mg by mouth daily.   FLUoxetine 20 MG tablet Commonly known as: PROZAC Take 1.5 tablets (30 mg total) by mouth daily. For the first 3 days take only 1/2 tablet daily What changed: how much to take Changed by: Kathlene November, MD   ibuprofen 800 MG tablet Commonly known as: ADVIL Take 1 tablet (800 mg total) by mouth every 8 (eight) hours as needed for moderate pain or cramping.   MULTIVITAMIN ADULT PO Take 1 tablet by mouth daily.   traMADol 50 MG tablet Commonly known as: ULTRAM TAKE 1 TABLET BY MOUTH EVERY 6 HOURS AS NEEDED FOR MODERATE PAIN           Objective:   Physical Exam BP (!) 169/74 (BP Location: Left Arm, Patient Position: Sitting, Cuff Size: Normal)   Pulse 67   Temp 98.4 F (36.9 C) (Oral)   Resp 16   Ht 5\' 5"  (1.651 m)   Wt 211 lb 6 oz (95.9 kg)   SpO2 99%   BMI 35.17 kg/m  General: Well developed, NAD, BMI noted Neck: No  thyromegaly  HEENT:  Normocephalic . Face symmetric, atraumatic Lungs:  CTA B Normal respiratory effort, no intercostal retractions, no accessory muscle use. Heart: RRR,  no murmur.  No pretibial edema bilaterally  Abdomen:  Not distended, soft, non-tender. No rebound or rigidity.   Skin: Exposed areas without rash. Not pale. Not jaundice Neurologic:  alert & oriented X3.  Speech normal, gait appropriate for age and unassisted Strength symmetric and appropriate for age.  Psych: Cognition and judgment appear intact.  Cooperative with normal attention span and concentration.  Behavior appropriate. No depressed appearing but she was emotional and tearful at times     Assessment      Assessment Anxiety  depression Osteopenia, last dexa 06-2015 MSK: Back pain, hip pain GERD DX by  ENT 2014  PLAN Anxiety depression: Symptoms increased lately, work-related : she is in her 5s and has been asked to go back to school but not virtually and she is worried about COVID-19 exposure. She was off medications for a while and doing well, however fluoxetine was restarted 10-2018, currently 20 mg, Will  increased to 30 mg. Will restart Wellbutrin as well.  See AVS On Xanax, RF sent. Reassess in 4 to 5 weeks  Elevated BP: Today BP is slightly elevated, she is stressed, recommend ambulatory BPs. Osteopenia: Currently on calcium, vitamin D. RTC 4 to 5 weeks

## 2018-11-16 NOTE — Patient Instructions (Addendum)
  GO TO THE FRONT DESK Schedule your next appointment for a checkup in 4 to 5 weeks  Increase fluoxetine 20 mg: 1.5 tablets daily  Continue Xanax as needed  Restart Wellbutrin XL 150 mg: 1 tablet daily    Check the  blood pressure 2 or 3 times a week BP GOAL is between 110/65 and  135/85. If it is consistently higher or lower, let me know

## 2018-11-19 LAB — PAIN MGMT, PROFILE 8 W/CONF, U
6 Acetylmorphine: NEGATIVE ng/mL
Alcohol Metabolites: NEGATIVE ng/mL (ref <500)
Alphahydroxyalprazolam: 184 ng/mL
Alphahydroxymidazolam: NEGATIVE ng/mL
Alphahydroxytriazolam: NEGATIVE ng/mL
Aminoclonazepam: NEGATIVE ng/mL
Amphetamines: NEGATIVE ng/mL
Benzodiazepines: POSITIVE ng/mL
Buprenorphine, Urine: NEGATIVE ng/mL
Cocaine Metabolite: NEGATIVE ng/mL
Creatinine: 121.2 mg/dL
Hydroxyethylflurazepam: NEGATIVE ng/mL
Lorazepam: NEGATIVE ng/mL
MDMA: NEGATIVE ng/mL
Marijuana Metabolite: NEGATIVE ng/mL
Nordiazepam: NEGATIVE ng/mL
Opiates: NEGATIVE ng/mL
Oxazepam: NEGATIVE ng/mL
Oxidant: NEGATIVE ug/mL
Oxycodone: NEGATIVE ng/mL
Temazepam: NEGATIVE ng/mL
pH: 5.1 (ref 4.5–9.0)

## 2018-11-19 LAB — PAIN MGMT, TRAMADOL W/MEDMATCH, U
Desmethyltramadol: NEGATIVE ng/mL
Tramadol: NEGATIVE ng/mL

## 2018-12-02 ENCOUNTER — Telehealth: Payer: Self-pay | Admitting: Family Medicine

## 2018-12-04 NOTE — Telephone Encounter (Signed)
Tramadol Rx.   Last OV: 11/16/2018 Last Fill: 06/26/2018 #28 and 0RF Pt sig: 1 tab q6h prn  UDS: 11/16/2018 Low risk

## 2018-12-04 NOTE — Telephone Encounter (Signed)
Takes as needed for MSK pain.  Prescription sent

## 2018-12-09 ENCOUNTER — Other Ambulatory Visit: Payer: Self-pay

## 2018-12-09 ENCOUNTER — Encounter: Payer: Self-pay | Admitting: Internal Medicine

## 2018-12-09 ENCOUNTER — Ambulatory Visit (INDEPENDENT_AMBULATORY_CARE_PROVIDER_SITE_OTHER): Payer: Commercial Managed Care - PPO | Admitting: Internal Medicine

## 2018-12-09 VITALS — BP 159/100 | HR 75 | Temp 97.7°F | Resp 16 | Ht 65.0 in | Wt 214.4 lb

## 2018-12-09 DIAGNOSIS — R030 Elevated blood-pressure reading, without diagnosis of hypertension: Secondary | ICD-10-CM | POA: Diagnosis not present

## 2018-12-09 DIAGNOSIS — F32A Depression, unspecified: Secondary | ICD-10-CM

## 2018-12-09 DIAGNOSIS — F419 Anxiety disorder, unspecified: Secondary | ICD-10-CM

## 2018-12-09 DIAGNOSIS — F329 Major depressive disorder, single episode, unspecified: Secondary | ICD-10-CM

## 2018-12-09 DIAGNOSIS — I10 Essential (primary) hypertension: Secondary | ICD-10-CM | POA: Diagnosis not present

## 2018-12-09 DIAGNOSIS — Z23 Encounter for immunization: Secondary | ICD-10-CM

## 2018-12-09 MED ORDER — CARVEDILOL 12.5 MG PO TABS: 12.5000 mg | ORAL_TABLET | Freq: Two times a day (BID) | ORAL | 6 refills | Status: DC

## 2018-12-09 NOTE — Progress Notes (Signed)
Subjective:    Patient ID: Jessica Huang, female    DOB: 11-Jun-1955, 63 y.o.   MRN: [id number]  DOS:  12/09/2018 Type of visit - description: f/u Since the last office visit, she started medications for anxiety is feeling better. Also she retired and that has decrease her stress level tremendously. BP noted to be elevated, at home has been also in the 150s over 90.  BP Readings from Last 3 Encounters:  12/09/18 (!) 159/100  11/16/18 (!) 169/74  09/18/18 (!) 160/65     Review of Systems Denies chest pain or difficulty breathing No lower extremity edema  Past Medical History:  Diagnosis Date  . Allergy   . Anxiety    takes Xanax daily  . Anxiety and depression    takes Wellbutrin and Prozac daily  . Back pain    buldging disc  . Cataract    early  . GERD (gastroesophageal reflux disease) 09-2012   dx by ENT   . Headache(784.0) last time at least a yr ago   migraine-occasionally  . Osteopenia 2004   Dexa, hip  . PMB (postmenopausal bleeding)   . Varicose veins     Past Surgical History:  Procedure Laterality Date  . APPENDECTOMY    . COLONOSCOPY    . DILATATION & CURETTAGE/HYSTEROSCOPY WITH MYOSURE N/A 09/18/2018   Procedure: DILATATION & CURETTAGE/HYSTEROSCOPY WITH MYOSURE;  Surgeon: Servando Salina, MD;  Location: [place];  Service: Gynecology;  Laterality: N/A;  . ORIF SHOULDER FRACTURE Right 11/18/2013   Procedure: OPEN REDUCTION INTERNAL FIXATION (ORIF) RIGHT SHOULDER TUBEROSITY FRACTURE;  Surgeon: Marin Shutter, MD;  Location: [place];  Service: Orthopedics;  Laterality: Right;  . TONSILLECTOMY    . VARICOSE VEIN SURGERY  2014   laser     Social History   Socioeconomic History  . Marital status: Divorced    Spouse name: Not on file  . Number of children: 2  . Years of education: Not on file  . Highest education level: Not on file  Occupational History  . Occupation: Civil engineer, contracting, 7th grade  Social Needs  . Financial  resource strain: Not on file  . Food insecurity    Worry: Not on file    Inability: Not on file  . Transportation needs    Medical: Not on file    Non-medical: Not on file  Tobacco Use  . Smoking status: Never Smoker  . Smokeless tobacco: Never Used  Substance and Sexual Activity  . Alcohol use: Not Currently    Alcohol/week: 0.0 standard drinks    Comment: socially   . Drug use: No  . Sexual activity: Not Currently    Birth control/protection: Post-menopausal  Lifestyle  . Physical activity    Days per week: Not on file    Minutes per session: Not on file  . Stress: Not on file  Relationships  . Social Herbalist on phone: Not on file    Gets together: Not on file    Attends religious service: Not on file    Active member of club or organization: Not on file    Attends meetings of clubs or organizations: Not on file    Relationship status: Not on file  . Intimate partner violence    Fear of current or ex partner: Not on file    Emotionally abused: Not on file    Physically abused: Not on file    Forced sexual activity: Not on  file  Other Topics Concern  . Not on file  Social History Narrative   divorce, 2 children , one lives at home, daughter got married 10-2015      Allergies as of 12/09/2018      Reactions   Penicillins Rash   CHILDHOOD ALLERGY      Medication List       Accurate as of December 09, 2018 10:40 AM. If you have any questions, ask your nurse or doctor.        ALPRAZolam 0.5 MG tablet Commonly known as: XANAX Take 0.5-1 tablets (0.25-0.5 mg total) by mouth 3 (three) times daily as needed for anxiety or sleep.   buPROPion 150 MG 24 hr tablet Commonly known as: WELLBUTRIN XL Take 1 tablet (150 mg total) by mouth daily.   CALCIUM 1000 + D PO Take 1,000 mg by mouth daily.   FLUoxetine 20 MG tablet Commonly known as: PROZAC Take 1.5 tablets (30 mg total) by mouth daily. For the first 3 days take only 1/2 tablet daily   ibuprofen  800 MG tablet Commonly known as: ADVIL Take 1 tablet (800 mg total) by mouth every 8 (eight) hours as needed for moderate pain or cramping.   MULTIVITAMIN ADULT PO Take 1 tablet by mouth daily.   traMADol 50 MG tablet Commonly known as: ULTRAM TAKE 1 TABLET BY MOUTH EVERY 6 HOURS AS NEEDED FOR MODERATE PAIN           Objective:   Physical Exam BP (!) 159/100 (BP Location: Left Arm, Patient Position: Sitting, Cuff Size: Normal)   Pulse 75   Temp 97.7 F (36.5 C) (Temporal)   Resp 16   Ht 5\' 5"  (1.651 m)   Wt 214 lb 6 oz (97.2 kg)   SpO2 98%   BMI 35.67 kg/m  General:   Well developed, NAD, BMI noted. HEENT:  Normocephalic . Face symmetric, atraumatic Lungs:  CTA B Normal respiratory effort, no intercostal retractions, no accessory muscle use. Heart: RRR,  no murmur.  No pretibial edema bilaterally  Skin: Not pale. Not jaundice Neurologic:  alert & oriented X3.  Speech normal, gait appropriate for age and unassisted Psych--  Cognition and judgment appear intact.  Cooperative with normal attention span and concentration.  Behavior appropriate. Seems to be doing well emotionally     Assessment     Assessment Anxiety depression Osteopenia, last dexa 06-2015 MSK: Back pain, hip pain GERD DX by ENT 2014  PLAN Anxiety- depression: Since the last visit, fluoxetine increased to 30 mg and she restarted Wellbutrin.  She also retired, much less stress.  Feeling well. Interaction between fluoxetine and tramadol noted, she takes tramadol very rarely. Plan: Continue present care.  Reassess in few months HTN: New diagnosis, BP today elevated, it has been elevated at home (checked by her daughter who is a Marine scientist). Last BMP satisfactory.  EKG today: NSR, no acute changes Plan: Carvedilol 12.5 twice daily, monitor BPs.  Call if not at goal or if she has side effects.  Plans to increase his exercise level gradually. Preventive care: Flu shot today RTC 4 months

## 2018-12-09 NOTE — Patient Instructions (Addendum)
   GO TO THE FRONT DESK Schedule your next appointment months  Start carvedilol 12.5 mg 1 tablet twice a day.   Check the  blood pressure 2 or 3 times a week BP GOAL is between 110/65 and  135/85. If it is consistently higher or lower, let me know  Call for refills when needed

## 2018-12-09 NOTE — Progress Notes (Signed)
Pre visit review using our clinic review tool, if applicable. No additional management support is needed unless otherwise documented below in the visit note. 

## 2018-12-10 DIAGNOSIS — I10 Essential (primary) hypertension: Secondary | ICD-10-CM | POA: Insufficient documentation

## 2018-12-10 NOTE — Assessment & Plan Note (Signed)
Anxiety- depression: Since the last visit, fluoxetine increased to 30 mg and she restarted Wellbutrin.  She also retired, much less stress.  Feeling well. Interaction between fluoxetine and tramadol noted, she takes tramadol very rarely. Plan: Continue present care.  Reassess in few months HTN: New diagnosis, BP today elevated, it has been elevated at home (checked by her daughter who is a Marine scientist). Last BMP satisfactory.  EKG today: NSR, no acute changes Plan: Carvedilol 12.5 twice daily, monitor BPs.  Call if not at goal or if she has side effects.  Plans to increase his exercise level gradually. Preventive care: Flu shot today RTC 4 months

## 2018-12-14 ENCOUNTER — Ambulatory Visit: Payer: Commercial Managed Care - PPO | Admitting: Internal Medicine

## 2018-12-16 ENCOUNTER — Encounter: Payer: Self-pay | Admitting: Internal Medicine

## 2019-03-24 ENCOUNTER — Telehealth: Payer: Self-pay | Admitting: Internal Medicine

## 2019-03-24 NOTE — Telephone Encounter (Signed)
Sent!

## 2019-03-24 NOTE — Telephone Encounter (Signed)
Alprazolam refill.   Last OV: 11/16/2018 Last Fill: 11/16/2018 #90 and 0RF Pt sig: 1/2 to 1 tab tid prn UDS: 11/16/2018 Low risk

## 2019-04-06 ENCOUNTER — Ambulatory Visit: Payer: Commercial Managed Care - PPO | Admitting: Internal Medicine

## 2019-04-30 ENCOUNTER — Other Ambulatory Visit: Payer: Self-pay

## 2019-05-03 ENCOUNTER — Ambulatory Visit: Payer: 59 | Admitting: Internal Medicine

## 2019-05-03 ENCOUNTER — Encounter: Payer: Self-pay | Admitting: Internal Medicine

## 2019-05-03 ENCOUNTER — Other Ambulatory Visit: Payer: Self-pay

## 2019-05-03 VITALS — BP 154/78 | HR 65 | Temp 96.5°F | Resp 16 | Ht 65.0 in | Wt 218.1 lb

## 2019-05-03 DIAGNOSIS — F329 Major depressive disorder, single episode, unspecified: Secondary | ICD-10-CM | POA: Diagnosis not present

## 2019-05-03 DIAGNOSIS — I10 Essential (primary) hypertension: Secondary | ICD-10-CM | POA: Diagnosis not present

## 2019-05-03 DIAGNOSIS — F32A Depression, unspecified: Secondary | ICD-10-CM

## 2019-05-03 DIAGNOSIS — F419 Anxiety disorder, unspecified: Secondary | ICD-10-CM

## 2019-05-03 MED ORDER — CARVEDILOL 25 MG PO TABS: 25.0000 mg | ORAL_TABLET | Freq: Two times a day (BID) | ORAL | 6 refills | Status: DC

## 2019-05-03 NOTE — Assessment & Plan Note (Signed)
Anxiety depression: Currently on Wellbutrin, Prozac and occasional Xanax.  Under very good control.  No change HTN: On carvedilol, ambulatory BPs 150/80 at home.  Admits for room for improvement on diet and exercise.  Taking the medication twice a day is difficult, changed to carvedilol extended release ? Cost  probably would be an issue. Because probably an issue. Plan: Increase carvedilol to 25 mg twice a day, her daughters are nurses and they can check her blood pressure/pulse, goals discussed, symptoms of bradycardia discussed. Low-salt diet Call if noted at goal RTC 11-2019 CPX

## 2019-05-03 NOTE — Progress Notes (Signed)
Pre visit review using our clinic review tool, if applicable. No additional management support is needed unless otherwise documented below in the visit note. 

## 2019-05-03 NOTE — Progress Notes (Signed)
   Subjective:    Patient ID: Jessica Huang, female    DOB: 29-Nov-1955, 64 y.o.   MRN: [id number]  DOS:  05/03/2019 Type of visit - description: Routine office visit Today we discussed anxiety, depression, and hypertension management. Ambulatory BPs a slightly elevated   Review of Systems Denies chest pain, shortness of breath.  No edema No headaches  Past Medical History:  Diagnosis Date  . Allergy   . Anxiety    takes Xanax daily  . Anxiety and depression    takes Wellbutrin and Prozac daily  . Back pain    buldging disc  . Cataract    early  . GERD (gastroesophageal reflux disease) 09-2012   dx by ENT   . Headache(784.0) last time at least a yr ago   migraine-occasionally  . Osteopenia 2004   Dexa, hip  . PMB (postmenopausal bleeding)   . Varicose veins     Past Surgical History:  Procedure Laterality Date  . APPENDECTOMY    . COLONOSCOPY    . DILATATION & CURETTAGE/HYSTEROSCOPY WITH MYOSURE N/A 09/18/2018   Procedure: DILATATION & CURETTAGE/HYSTEROSCOPY WITH MYOSURE;  Surgeon: Servando Salina, MD;  Location: [facility];  Service: Gynecology;  Laterality: N/A;  . ORIF SHOULDER FRACTURE Right 11/18/2013   Procedure: OPEN REDUCTION INTERNAL FIXATION (ORIF) RIGHT SHOULDER TUBEROSITY FRACTURE;  Surgeon: Marin Shutter, MD;  Location: [facility];  Service: Orthopedics;  Laterality: Right;  . TONSILLECTOMY    . VARICOSE VEIN SURGERY  2014   laser             Objective:   Physical Exam BP (!) 154/78 (BP Location: Left Arm, Patient Position: Sitting, Cuff Size: Normal)   Pulse 65   Temp (!) 96.5 F (35.8 C) (Temporal)   Resp 16   Ht 5\' 5"  (1.651 m)   Wt 218 lb 2 oz (98.9 kg)   SpO2 98%   BMI 36.30 kg/m  General:   Well developed, NAD, BMI noted. HEENT:  Normocephalic . Face symmetric, atraumatic Lungs:  CTA B Normal respiratory effort, no intercostal retractions, no accessory muscle use. Heart: RRR,  no murmur.  No pretibial edema  bilaterally  Skin: Not pale. Not jaundice Neurologic:  alert & oriented X3.  Speech normal, gait appropriate for age and unassisted Psych--  Cognition and judgment appear intact.  Cooperative with normal attention span and concentration.  Behavior appropriate. No anxious or depressed appearing.      Assessment     Assessment HTN Anxiety depression Osteopenia, last dexa 06-2015 MSK: Back pain, hip pain GERD DX by ENT 2014  PLAN Anxiety depression: Currently on Wellbutrin, Prozac and occasional Xanax.  Under very good control.  No change HTN: On carvedilol, ambulatory BPs 150/80 at home.  Admits for room for improvement on diet and exercise.  Taking the medication twice a day is difficult, changed to carvedilol extended release ? Cost  probably would be an issue. Because probably an issue. Plan: Increase carvedilol to 25 mg twice a day, her daughters are nurses and they can check her blood pressure/pulse, goals discussed, symptoms of bradycardia discussed. Low-salt diet Call if noted at goal RTC 11-2019 CPX   This visit occurred during the SARS-CoV-2 public health emergency.  Safety protocols were in place, including screening questions prior to the visit, additional usage of staff PPE, and extensive cleaning of exam room while observing appropriate contact time as indicated for disinfecting solutions.

## 2019-05-03 NOTE — Patient Instructions (Addendum)
GO TO THE FRONT DESK Schedule your next appointment for a physical exam by 8-20 21  Increase carvedilol to 25 mg twice a day  Check the  blood pressure weekly BP GOAL is between 110/65 and  135/85. Also, your heart rate should be over the mid 50s.  If that is not the case let me know If you have symptoms of low heart rate let me know       DASH Eating Plan DASH stands for "Dietary Approaches to Stop Hypertension." The DASH eating plan is a healthy eating plan that has been shown to reduce high blood pressure (hypertension). It may also reduce your risk for type 2 diabetes, heart disease, and stroke. The DASH eating plan may also help with weight loss. What are tips for following this plan?  General guidelines  Avoid eating more than 2,300 mg (milligrams) of salt (sodium) a day. If you have hypertension, you may need to reduce your sodium intake to 1,500 mg a day.  Limit alcohol intake to no more than 1 drink a day for nonpregnant women and 2 drinks a day for men. One drink equals 12 oz of beer, 5 oz of wine, or 1 oz of hard liquor.  Work with your health care provider to maintain a healthy body weight or to lose weight. Ask what an ideal weight is for you.  Get at least 30 minutes of exercise that causes your heart to beat faster (aerobic exercise) most days of the week. Activities may include walking, swimming, or biking.  Work with your health care provider or diet and nutrition specialist (dietitian) to adjust your eating plan to your individual calorie needs. Reading food labels   Check food labels for the amount of sodium per serving. Choose foods with less than 5 percent of the Daily Value of sodium. Generally, foods with less than 300 mg of sodium per serving fit into this eating plan.  To find whole grains, look for the word "whole" as the first word in the ingredient list. Shopping  Buy products labeled as "low-sodium" or "no salt added."  Buy fresh foods. Avoid  canned foods and premade or frozen meals. Cooking  Avoid adding salt when cooking. Use salt-free seasonings or herbs instead of table salt or sea salt. Check with your health care provider or pharmacist before using salt substitutes.  Do not fry foods. Cook foods using healthy methods such as baking, boiling, grilling, and broiling instead.  Cook with heart-healthy oils, such as olive, canola, soybean, or sunflower oil. Meal planning  Eat a balanced diet that includes: ? 5 or more servings of fruits and vegetables each day. At each meal, try to fill half of your plate with fruits and vegetables. ? Up to 6-8 servings of whole grains each day. ? Less than 6 oz of lean meat, poultry, or fish each day. A 3-oz serving of meat is about the same size as a deck of cards. One egg equals 1 oz. ? 2 servings of low-fat dairy each day. ? A serving of nuts, seeds, or beans 5 times each week. ? Heart-healthy fats. Healthy fats called Omega-3 fatty acids are found in foods such as flaxseeds and coldwater fish, like sardines, salmon, and mackerel.  Limit how much you eat of the following: ? Canned or prepackaged foods. ? Food that is high in trans fat, such as fried foods. ? Food that is high in saturated fat, such as fatty meat. ? Sweets, desserts, sugary drinks, and  other foods with added sugar. ? Full-fat dairy products.  Do not salt foods before eating.  Try to eat at least 2 vegetarian meals each week.  Eat more home-cooked food and less restaurant, buffet, and fast food.  When eating at a restaurant, ask that your food be prepared with less salt or no salt, if possible. What foods are recommended? The items listed may not be a complete list. Talk with your dietitian about what dietary choices are best for you. Grains Whole-grain or whole-wheat bread. Whole-grain or whole-wheat pasta. Brown rice. [place]. Bulgur. Whole-grain and low-sodium cereals. Pita bread. Low-fat, low-sodium  crackers. Whole-wheat flour tortillas. Vegetables Fresh or frozen vegetables (raw, steamed, roasted, or grilled). Low-sodium or reduced-sodium tomato and vegetable juice. Low-sodium or reduced-sodium tomato sauce and tomato paste. Low-sodium or reduced-sodium canned vegetables. Fruits All fresh, dried, or frozen fruit. Canned fruit in natural juice (without added sugar). Meat and other protein foods Skinless chicken or [place]. Ground chicken or [place]. Pork with fat trimmed off. Fish and seafood. Egg whites. Dried beans, peas, or lentils. Unsalted nuts, nut butters, and seeds. Unsalted canned beans. Lean cuts of beef with fat trimmed off. Low-sodium, lean deli meat. Dairy Low-fat (1%) or fat-free (skim) milk. Fat-free, low-fat, or reduced-fat cheeses. Nonfat, low-sodium ricotta or cottage cheese. Low-fat or nonfat yogurt. Low-fat, low-sodium cheese. Fats and oils Soft margarine without trans fats. Vegetable oil. Low-fat, reduced-fat, or light mayonnaise and salad dressings (reduced-sodium). Canola, safflower, olive, soybean, and sunflower oils. Avocado. Seasoning and other foods Herbs. Spices. Seasoning mixes without salt. Unsalted popcorn and pretzels. Fat-free sweets. What foods are not recommended? The items listed may not be a complete list. Talk with your dietitian about what dietary choices are best for you. Grains Baked goods made with fat, such as croissants, muffins, or some breads. Dry pasta or rice meal packs. Vegetables Creamed or fried vegetables. Vegetables in a cheese sauce. Regular canned vegetables (not low-sodium or reduced-sodium). Regular canned tomato sauce and paste (not low-sodium or reduced-sodium). Regular tomato and vegetable juice (not low-sodium or reduced-sodium). Angie Fava. Olives. Fruits Canned fruit in a light or heavy syrup. Fried fruit. Fruit in cream or butter sauce. Meat and other protein foods Fatty cuts of meat. Ribs. Fried meat. Berniece Salines. Sausage. Bologna and  other processed lunch meats. Salami. Fatback. Hotdogs. Bratwurst. Salted nuts and seeds. Canned beans with added salt. Canned or smoked fish. Whole eggs or egg yolks. Chicken or [place] with skin. Dairy Whole or 2% milk, cream, and half-and-half. Whole or full-fat cream cheese. Whole-fat or sweetened yogurt. Full-fat cheese. Nondairy creamers. Whipped toppings. Processed cheese and cheese spreads. Fats and oils Butter. Stick margarine. Lard. Shortening. Ghee. Bacon fat. Tropical oils, such as coconut, palm kernel, or palm oil. Seasoning and other foods Salted popcorn and pretzels. Onion salt, garlic salt, seasoned salt, table salt, and sea salt. Worcestershire sauce. Tartar sauce. Barbecue sauce. Teriyaki sauce. Soy sauce, including reduced-sodium. Steak sauce. Canned and packaged gravies. Fish sauce. Oyster sauce. Cocktail sauce. Horseradish that you find on the shelf. Ketchup. Mustard. Meat flavorings and tenderizers. Bouillon cubes. Hot sauce and Tabasco sauce. Premade or packaged marinades. Premade or packaged taco seasonings. Relishes. Regular salad dressings. Where to find more information:  National Heart, Lung, and [place]: https://wilson-eaton.com/  American Heart Association: www.heart.org Summary  The DASH eating plan is a healthy eating plan that has been shown to reduce high blood pressure (hypertension). It may also reduce your risk for type 2 diabetes, heart disease, and stroke.  With the DASH eating plan, you should limit salt (sodium) intake to 2,300 mg a day. If you have hypertension, you may need to reduce your sodium intake to 1,500 mg a day.  When on the DASH eating plan, aim to eat more fresh fruits and vegetables, whole grains, lean proteins, low-fat dairy, and heart-healthy fats.  Work with your health care provider or diet and nutrition specialist (dietitian) to adjust your eating plan to your individual calorie needs. This information is not intended to replace advice  given to you by your health care provider. Make sure you discuss any questions you have with your health care provider. Document Revised: 03/14/2017 Document Reviewed: 03/25/2016 Elsevier Patient Education  2020 [employer].

## 2019-07-22 ENCOUNTER — Other Ambulatory Visit: Payer: Self-pay | Admitting: Internal Medicine

## 2019-08-31 LAB — HM PAP SMEAR: HM Pap smear: NEGATIVE

## 2019-08-31 LAB — HEPATIC FUNCTION PANEL
ALT: 6 — AB (ref 7–35)
AST: 16 (ref 13–35)
Alkaline Phosphatase: 126 — AB (ref 25–125)
Bilirubin, Total: 0.4

## 2019-08-31 LAB — BASIC METABOLIC PANEL
BUN: 15 (ref 4–21)
CO2: 22 (ref 13–22)
Chloride: 108 (ref 99–108)
Creatinine: 1.2 — AB (ref 0.5–1.1)
Glucose: 86
Potassium: 4.8 (ref 3.4–5.3)
Sodium: 142 (ref 137–147)

## 2019-08-31 LAB — CBC AND DIFFERENTIAL
HCT: 39 (ref 36–46)
Hemoglobin: 13.3 (ref 12.0–16.0)
Platelets: 265 (ref 150–399)
WBC: 6.8

## 2019-08-31 LAB — COMPREHENSIVE METABOLIC PANEL
Albumin: 3.9 (ref 3.5–5.0)
Calcium: 9.2 (ref 8.7–10.7)
GFR calc Af Amer: 59
GFR calc non Af Amer: 51
Globulin: 2.9

## 2019-08-31 LAB — LIPID PANEL
Cholesterol: 191 (ref 0–200)
HDL: 55 (ref 35–70)
LDL Cholesterol: 121
Triglycerides: 84 (ref 40–160)

## 2019-08-31 LAB — HEMOGLOBIN A1C: Hemoglobin A1C: 5

## 2019-08-31 LAB — HM MAMMOGRAPHY

## 2019-08-31 LAB — CBC: RBC: 4.46 (ref 3.87–5.11)

## 2019-08-31 LAB — TSH: TSH: 1.94 (ref 0.41–5.90)

## 2019-09-03 ENCOUNTER — Encounter: Payer: Self-pay | Admitting: Internal Medicine

## 2019-09-17 ENCOUNTER — Telehealth: Payer: Self-pay | Admitting: Internal Medicine

## 2019-09-17 NOTE — Telephone Encounter (Signed)
Alprazolam refill.   Last OV: 05/03/2019 Last Fill: 03/24/2019 #90 and 0RF Pt sig: 1/2 to 1 tab tid prn UDS: 11/16/2018 Low risk

## 2019-09-17 NOTE — Telephone Encounter (Signed)
PDMP okay, prescription sent 

## 2019-11-19 ENCOUNTER — Encounter: Payer: Self-pay | Admitting: Internal Medicine

## 2019-11-19 ENCOUNTER — Other Ambulatory Visit: Payer: Self-pay

## 2019-11-19 ENCOUNTER — Telehealth (INDEPENDENT_AMBULATORY_CARE_PROVIDER_SITE_OTHER): Payer: 59 | Admitting: Internal Medicine

## 2019-11-19 VITALS — Ht 65.0 in | Wt 220.0 lb

## 2019-11-19 DIAGNOSIS — J019 Acute sinusitis, unspecified: Secondary | ICD-10-CM | POA: Diagnosis not present

## 2019-11-19 DIAGNOSIS — I10 Essential (primary) hypertension: Secondary | ICD-10-CM | POA: Diagnosis not present

## 2019-11-19 MED ORDER — PREDNISONE 10 MG PO TABS: ORAL_TABLET | ORAL | 0 refills | Status: DC

## 2019-11-19 MED ORDER — DOXYCYCLINE HYCLATE 100 MG PO TABS
100.0000 mg | ORAL_TABLET | Freq: Two times a day (BID) | ORAL | 0 refills | Status: DC
Start: 2019-11-19 — End: 2019-12-07

## 2019-11-19 MED ORDER — AZELASTINE HCL 0.1 % NA SOLN
2.0000 | Freq: Every evening | NASAL | 3 refills | Status: DC | PRN
Start: 2019-11-19 — End: 2019-12-07

## 2019-11-19 NOTE — Progress Notes (Signed)
Subjective:    Patient ID: Jessica Huang, female    DOB: [birth year], 64 y.o.   MRN: [id number]  DOS:  11/19/2019 Type of visit - description: Virtual Visit via Video Note  I connected with the above patient  by a video enabled telemedicine application and verified that I am speaking with the correct person using two identifiers.   THIS ENCOUNTER IS A VIRTUAL VISIT DUE TO COVID-19 - PATIENT WAS NOT SEEN IN THE OFFICE. PATIENT HAS CONSENTED TO VIRTUAL VISIT / TELEMEDICINE VISIT   Location of patient: home  Location of provider: office  Persons participating in the virtual visit: patient, provider   I discussed the limitations of evaluation and management by telemedicine and the availability of in person appointments. The patient expressed understanding and agreed to proceed.  Acute Symptoms started 3 to 4 weeks ago, reports headache, sinus pressure, "dental pain". She felt like she was having sinusitis, took Sudafed and subsequently a left over Z-Pak.  She is also doing Flonase and   nasal lavage. Has not improved.    Review of Systems Denies fever chills No itching eyes, sneezing. Symptoms did not to start with a virus (no nausea, vomiting, myalgias, cough). She has 2 Covid vaccines  Past Medical History:  Diagnosis Date  . Allergy   . Anxiety    takes Xanax daily  . Anxiety and depression    takes Wellbutrin and Prozac daily  . Back pain    buldging disc  . Cataract    early  . GERD (gastroesophageal reflux disease) 09-2012   dx by ENT   . Headache(784.0) last time at least a yr ago   migraine-occasionally  . Osteopenia 2004   Dexa, hip  . PMB (postmenopausal bleeding)   . Varicose veins     Past Surgical History:  Procedure Laterality Date  . APPENDECTOMY    . COLONOSCOPY    . DILATATION & CURETTAGE/HYSTEROSCOPY WITH MYOSURE N/A 09/18/2018   Procedure: DILATATION & CURETTAGE/HYSTEROSCOPY WITH MYOSURE;  Surgeon: Servando Salina, MD;  Location: [facility];  Service: Gynecology;  Laterality: N/A;  . ORIF SHOULDER FRACTURE Right 11/18/2013   Procedure: OPEN REDUCTION INTERNAL FIXATION (ORIF) RIGHT SHOULDER TUBEROSITY FRACTURE;  Surgeon: Marin Shutter, MD;  Location: [facility];  Service: Orthopedics;  Laterality: Right;  . TONSILLECTOMY    . VARICOSE VEIN SURGERY  2014   laser     Allergies as of 11/19/2019      Reactions   Penicillins Rash   CHILDHOOD ALLERGY      Medication List       Accurate as of November 19, 2019 11:59 PM. If you have any questions, ask your nurse or doctor.        STOP taking these medications   FLUoxetine 20 MG tablet Commonly known as: PROZAC Stopped by: Kathlene November, MD   ibuprofen 800 MG tablet Commonly known as: ADVIL Stopped by: Kathlene November, MD     TAKE these medications   ALPRAZolam 0.5 MG tablet Commonly known as: XANAX TAKE 1/2 TO 1  TABLET BY MOUTH THREE TIMES DAILY AS NEEDED FOR ANXIETY OR SLEEP   azelastine 0.1 % nasal spray Commonly known as: ASTELIN Place 2 sprays into both nostrils at bedtime as needed for rhinitis. Use in each nostril as directed Started by: Kathlene November, MD   buPROPion 150 MG 24 hr tablet Commonly known as: WELLBUTRIN XL Take 1 tablet (150 mg total) by mouth daily.   CALCIUM 1000 +  D PO Take 1,000 mg by mouth daily.   carvedilol 25 MG tablet Commonly known as: COREG Take 1 tablet (25 mg total) by mouth 2 (two) times daily with a meal.   doxycycline 100 MG tablet Commonly known as: VIBRA-TABS Take 1 tablet (100 mg total) by mouth 2 (two) times daily. Started by: Kathlene November, MD   MULTIVITAMIN ADULT PO Take 1 tablet by mouth daily.   predniSONE 10 MG tablet Commonly known as: DELTASONE 3 tabs x 3 days, 2 tabs x 3 days, 1 tab x 3 days Started by: Kathlene November, MD   traMADol 50 MG tablet Commonly known as: ULTRAM TAKE 1 TABLET BY MOUTH EVERY 6 HOURS AS NEEDED FOR MODERATE PAIN          Objective:   Physical Exam Ht 5\' 5"  (1.651 m)   Wt 220 lb (99.8 kg)    BMI 36.61 kg/m  This is a virtual video visit, she is alert oriented x3, in no distress.    Assessment    Assessment HTN Anxiety depression Osteopenia, last dexa 06-2015 MSK: Back pain, hip pain GERD DX by ENT 2014  PLAN Sinusitis?. Symptoms do suggest sinusitis, ability to diagnose limited by this been a virtual visit. Plan: Continue Flonase, add Astelin, prednisone for few days, doxycycline for 1 week.  If not better she will let me know. HTN: On carvedilol, ambulatory BPs in the 120s. Anxiety depression: Self DC fluoxetine, was making her sleepy.  Also she retired and  her stress has decreased.  Doing well on Wellbutrin only. RTC already scheduled in a couple of weeks for CPX   I discussed the assessment and treatment plan with the patient. The patient was provided an opportunity to ask questions and all were answered. The patient agreed with the plan and demonstrated an understanding of the instructions.   The patient was advised to call back or seek an in-person evaluation if the symptoms worsen or if the condition fails to improve as anticipated.

## 2019-11-19 NOTE — Progress Notes (Signed)
Pre visit review using our clinic review tool, if applicable. No additional management support is needed unless otherwise documented below in the visit note. 

## 2019-11-20 NOTE — Assessment & Plan Note (Signed)
Sinusitis?. Symptoms do suggest sinusitis, ability to diagnose limited by this been a virtual visit. Plan: Continue Flonase, add Astelin, prednisone for few days, doxycycline for 1 week.  If not better she will let me know. HTN: On carvedilol, ambulatory BPs in the 120s. Anxiety depression: Self DC fluoxetine, was making her sleepy.  Also she retired and  her stress has decreased.  Doing well on Wellbutrin only. RTC already scheduled in a couple of weeks for CPX

## 2019-11-29 ENCOUNTER — Encounter: Payer: Self-pay | Admitting: Internal Medicine

## 2019-12-03 ENCOUNTER — Encounter: Payer: 59 | Admitting: Internal Medicine

## 2019-12-07 ENCOUNTER — Other Ambulatory Visit: Payer: Self-pay

## 2019-12-07 ENCOUNTER — Ambulatory Visit (INDEPENDENT_AMBULATORY_CARE_PROVIDER_SITE_OTHER): Payer: 59 | Admitting: Internal Medicine

## 2019-12-07 ENCOUNTER — Encounter: Payer: Self-pay | Admitting: Internal Medicine

## 2019-12-07 VITALS — BP 142/78 | HR 63 | Temp 98.1°F | Resp 16 | Ht 65.0 in | Wt 232.5 lb

## 2019-12-07 DIAGNOSIS — F419 Anxiety disorder, unspecified: Secondary | ICD-10-CM

## 2019-12-07 DIAGNOSIS — F329 Major depressive disorder, single episode, unspecified: Secondary | ICD-10-CM

## 2019-12-07 DIAGNOSIS — Z79899 Other long term (current) drug therapy: Secondary | ICD-10-CM | POA: Diagnosis not present

## 2019-12-07 DIAGNOSIS — Z0000 Encounter for general adult medical examination without abnormal findings: Secondary | ICD-10-CM | POA: Diagnosis not present

## 2019-12-07 DIAGNOSIS — F32A Depression, unspecified: Secondary | ICD-10-CM

## 2019-12-07 NOTE — Progress Notes (Signed)
Subjective:    Patient ID: Jessica Huang, female    DOB: 1956/02/23, 64 y.o.   MRN: [id number]  DOS:  12/07/2019 Type of visit - description: CPX  In general feeling well, has no concerns. Was recently seen with sinusitis, symptoms eventually resolved.  BP Readings from Last 3 Encounters:  12/07/19 (!) 142/78  05/03/19 (!) 154/78  12/09/18 (!) 159/100    Review of Systems  Other than above, a 14 point review of systems is negative    Past Medical History:  Diagnosis Date  . Allergy   . Anxiety    takes Xanax daily  . Anxiety and depression    takes Wellbutrin and Prozac daily  . Back pain    buldging disc  . Cataract    early  . GERD (gastroesophageal reflux disease) 09-2012   dx by ENT   . Headache(784.0) last time at least a yr ago   migraine-occasionally  . Osteopenia 2004   Dexa, hip  . PMB (postmenopausal bleeding)   . Varicose veins     Past Surgical History:  Procedure Laterality Date  . APPENDECTOMY    . COLONOSCOPY    . DILATATION & CURETTAGE/HYSTEROSCOPY WITH MYOSURE N/A 09/18/2018   Procedure: DILATATION & CURETTAGE/HYSTEROSCOPY WITH MYOSURE;  Surgeon: Servando Salina, MD;  Location: [facility];  Service: Gynecology;  Laterality: N/A;  . ORIF SHOULDER FRACTURE Right 11/18/2013   Procedure: OPEN REDUCTION INTERNAL FIXATION (ORIF) RIGHT SHOULDER TUBEROSITY FRACTURE;  Surgeon: Marin Shutter, MD;  Location: [facility];  Service: Orthopedics;  Laterality: Right;  . TONSILLECTOMY    . VARICOSE VEIN SURGERY  2014   laser     Allergies as of 12/07/2019      Reactions   Penicillins Rash   CHILDHOOD ALLERGY      Medication List       Accurate as of December 07, 2019  9:24 PM. If you have any questions, ask your nurse or doctor.        STOP taking these medications   azelastine 0.1 % nasal spray Commonly known as: ASTELIN Stopped by: Kathlene November, MD   doxycycline 100 MG tablet Commonly known as: VIBRA-TABS Stopped by: Kathlene November, MD    MULTIVITAMIN ADULT PO Stopped by: Kathlene November, MD   predniSONE 10 MG tablet Commonly known as: DELTASONE Stopped by: Kathlene November, MD     TAKE these medications   ALPRAZolam 0.5 MG tablet Commonly known as: XANAX TAKE 1/2 TO 1  TABLET BY MOUTH THREE TIMES DAILY AS NEEDED FOR ANXIETY OR SLEEP   B-12 PO Take by mouth.   buPROPion 150 MG 24 hr tablet Commonly known as: WELLBUTRIN XL Take 1 tablet (150 mg total) by mouth daily.   CALCIUM 1000 + D PO Take 1,000 mg by mouth daily.   carvedilol 25 MG tablet Commonly known as: COREG Take 1 tablet (25 mg total) by mouth 2 (two) times daily with a meal.   fexofenadine 180 MG tablet Commonly known as: ALLEGRA Take 180 mg by mouth daily.   fluticasone 50 MCG/ACT nasal spray Commonly known as: FLONASE Place 1-2 sprays into both nostrils daily.   traMADol 50 MG tablet Commonly known as: ULTRAM TAKE 1 TABLET BY MOUTH EVERY 6 HOURS AS NEEDED FOR MODERATE PAIN          Objective:   Physical Exam BP (!) 142/78 (BP Location: Left Arm)   Pulse 63   Temp 98.1 F (36.7 C) (Oral)  Resp 16   Ht 5\' 5"  (1.651 m)   Wt 232 lb 8 oz (105.5 kg)   SpO2 99%   BMI 38.69 kg/m  General: Well developed, NAD, BMI noted Neck: No  thyromegaly  HEENT:  Normocephalic . Face symmetric, atraumatic Lungs:  CTA B Normal respiratory effort, no intercostal retractions, no accessory muscle use. Heart: RRR,  no murmur.  Abdomen:  Not distended, soft, non-tender. No rebound or rigidity.   Lower extremities: no pretibial edema bilaterally  Skin: Exposed areas without rash. Not pale. Not jaundice Neurologic:  alert & oriented X3.  Speech normal, gait appropriate for age and unassisted Strength symmetric and appropriate for age.  Psych: Cognition and judgment appear intact.  Cooperative with normal attention span and concentration.  Behavior appropriate. No anxious or depressed appearing.     Assessment    Assessment HTN Anxiety  depression Osteopenia, last dexa 06-2015 MSK: Back pain, hip pain GERD DX by ENT 2014 Menopause age ~ 89  PLAN Here for CPX HTN: On carvedilol, BPs at the office are elevated, BPs at home checked by her daughter Investment banker, corporate)   are okay. BP today was repeated: 142/78, recommend no change. Anxiety depression: Controlled, on Wellbutrin, Xanax, check UDS. Back pain, hip pain: Occasionally takes tramadol Sinusitis: See last visit, symptoms resolved RTC 1 year  This visit occurred during the SARS-CoV-2 public health emergency.  Safety protocols were in place, including screening questions prior to the visit, additional usage of staff PPE, and extensive cleaning of exam room while observing appropriate contact time as indicated for disinfecting solutions.

## 2019-12-07 NOTE — Assessment & Plan Note (Signed)
-  Td 11/2018 -  zostavax 2017 - shingrex:  wait till age 64 - s/p covid vaccines  -   flu shot recommended --Cscope neg 1-09, cscope 09/2017 next per GI -Female care: sees gynecology   +FH breast ca: [facility] 08/2019 wnl per pt Dexa @ gyn -Labs done at  gynecology, requesting a copy -Diet and exercise discussed

## 2019-12-07 NOTE — Patient Instructions (Signed)
Check the  blood pressure weekly BP GOAL is between 110/65 and  135/85. If it is consistently higher or lower, let me know  GO TO THE LAB : Provide a urine sample  GO TO THE FRONT DESK, PLEASE SCHEDULE YOUR APPOINTMENTS Come back for a physical exam in 1 year

## 2019-12-07 NOTE — Assessment & Plan Note (Signed)
Here for CPX HTN: On carvedilol, BPs at the office are elevated, BPs at home checked by her daughter Investment banker, [employer])   are okay. BP today was repeated: 142/78, recommend no change. Anxiety depression: Controlled, on Wellbutrin, Xanax, check UDS. Back pain, hip pain: Occasionally takes tramadol Sinusitis: See last visit, symptoms resolved RTC 1 year

## 2019-12-07 NOTE — Progress Notes (Signed)
Pre visit review using our clinic review tool, if applicable. No additional management support is needed unless otherwise documented below in the visit note. 

## 2019-12-09 LAB — DRUG MONITORING, PANEL 8 WITH CONFIRMATION, URINE
6 Acetylmorphine: NEGATIVE ng/mL (ref <10)
Alcohol Metabolites: NEGATIVE ng/mL
Alphahydroxyalprazolam: 47 ng/mL — ABNORMAL HIGH (ref <25)
Alphahydroxymidazolam: NEGATIVE ng/mL (ref <50)
Alphahydroxytriazolam: NEGATIVE ng/mL (ref <50)
Aminoclonazepam: NEGATIVE ng/mL (ref <25)
Amphetamines: NEGATIVE ng/mL (ref <500)
Benzodiazepines: POSITIVE ng/mL — AB (ref <100)
Buprenorphine, Urine: NEGATIVE ng/mL (ref <5)
Cocaine Metabolite: NEGATIVE ng/mL (ref <150)
Creatinine: 120.9 mg/dL
Hydroxyethylflurazepam: NEGATIVE ng/mL (ref <50)
Lorazepam: NEGATIVE ng/mL (ref <50)
MDMA: NEGATIVE ng/mL (ref <500)
Marijuana Metabolite: NEGATIVE ng/mL (ref <20)
Nordiazepam: NEGATIVE ng/mL (ref <50)
Opiates: NEGATIVE ng/mL (ref <100)
Oxazepam: NEGATIVE ng/mL (ref <50)
Oxidant: NEGATIVE ug/mL
Oxycodone: NEGATIVE ng/mL (ref <100)
Temazepam: NEGATIVE ng/mL (ref <50)
pH: 5.1 (ref 4.5–9.0)

## 2019-12-09 LAB — DRUG MONITOR, TRAMADOL,QN, URINE
Desmethyltramadol: NEGATIVE ng/mL (ref <100)
Tramadol: NEGATIVE ng/mL (ref <100)

## 2019-12-09 LAB — DM TEMPLATE

## 2019-12-13 ENCOUNTER — Other Ambulatory Visit: Payer: Self-pay | Admitting: Internal Medicine

## 2019-12-17 ENCOUNTER — Telehealth: Payer: Self-pay

## 2019-12-17 ENCOUNTER — Encounter: Payer: Self-pay | Admitting: Internal Medicine

## 2019-12-17 NOTE — Telephone Encounter (Signed)
Received lab results from [facility] done 08/2019 that we requested. I have abstracted them for you to review.

## 2019-12-21 NOTE — Telephone Encounter (Signed)
Labs from 08/2019: A1c 5.0 Creatinine 1.1 Potassium 4.8 LFTs normal Total cholesterol 191, HDL 55, LDL 121.  (CV RF 10-year: 8%) TSH 1.9 Hemoglobin 13.3.  Advise patient: I reviewed the labs, they are okay.

## 2019-12-22 NOTE — Telephone Encounter (Signed)
Mychart message sent.

## 2020-01-18 ENCOUNTER — Other Ambulatory Visit: Payer: Self-pay | Admitting: Internal Medicine

## 2020-01-25 ENCOUNTER — Telehealth: Payer: Self-pay | Admitting: Internal Medicine

## 2020-01-25 NOTE — Telephone Encounter (Signed)
PDMP okay, Rx sig changed to 1 p.o. daily as needed

## 2020-01-25 NOTE — Telephone Encounter (Signed)
Requesting: tramadol 50mg  Contract: 11/07/2016 UDS: 12/07/2019 Low risk Last Visit: 12/07/2019 Next Visit: None scheduled Last Refill: 12/04/2018 #30 and 0RF Pt sig: 1 tab q6h prn  Please Advise

## 2020-03-22 ENCOUNTER — Telehealth: Payer: Self-pay | Admitting: Internal Medicine

## 2020-03-22 NOTE — Telephone Encounter (Signed)
PDMP okay, prescriptions sent

## 2020-03-22 NOTE — Telephone Encounter (Signed)
Requesting: alprazolam and tramadol Contract: 11/07/2016 UDS: 12/07/2019 Last Visit: 12/07/2019 Next Visit: None scheduled Last Refill on alprazolam: 09/17/2019 #90 and 0RF Last Refill on tramadol: 01/25/2020 #30 and 0RF  Please Advise

## 2020-06-29 ENCOUNTER — Encounter: Payer: Self-pay | Admitting: Internal Medicine

## 2020-09-04 ENCOUNTER — Ambulatory Visit: Payer: 59 | Admitting: Family Medicine

## 2020-09-04 NOTE — Progress Notes (Deleted)
[place] [facility] [address], [place], [place] [address] [phone]  Date:  09/04/2020   Name:  Jessica Huang   DOB:  21-Jul-1955   MRN:  [id number]  PCP:  Colon Branch, MD    Chief Complaint: No chief complaint on file.   History of Present Illness:  Jessica Huang is a 65 y.o. very pleasant female patient who presents with the following:  Pt of Dr Larose Kells here today with concern of anxiety I have not seen her myself in the past   wellbutrin 150 daily Xanax 0.5 TID prn   08/12/2020  03/22/2020   1  Alprazolam 0.5 Mg Tablet  90.00  30  Donald Siva  [id number]  Pub [phone])  0/0  3.00 LME  Comm Ins  [place]    03/24/2020  03/22/2020   1  Alprazolam 0.5 Mg Tablet  90.00  30  Donald Siva  [id number]  Wal [phone])  0/1  3.00 LME  Private Pay  [place]    03/24/2020  03/22/2020   1  Tramadol Hcl 50 Mg Tablet  30.00  30  Donald Siva  [id number]  Wal [phone])  0/1  5.00 MME  Private Pay  [place]    01/25/2020  01/25/2020   1  Tramadol Hcl 50 Mg Tablet  30.00  30  Donald Siva  [id number]  Wal [phone])  0/0  5.00 MME  Private Pay  [place]    09/17/2019  09/17/2019   1  Alprazolam 0.5 Mg Tablet  90.00  30  Donald Siva  [id number]  Wal [phone])  0/0  3.00 LME  Private Pay  [place]    03/24/2019  03/24/2019   1  Alprazolam 0.5 Mg Tablet  90.00  [place]  [id number]            Patient Active Problem List   Diagnosis Date Noted  . Hypertension 12/10/2018  . PCP NOTES >>>> 02/14/2015  . Osteopenia 05/16/2014  . GERD (gastroesophageal reflux disease) 09/13/2012  . Annual physical exam 01/11/2011  . Anxiety and depression     Past Medical History:  Diagnosis Date  . Allergy   . Anxiety    takes Xanax daily  . Anxiety and depression    takes Wellbutrin and Prozac daily  . Back pain    buldging disc  . Cataract    early  . GERD (gastroesophageal reflux disease) 09-2012   dx by ENT   . Headache(784.0) last time at least a yr ago   migraine-occasionally  . Osteopenia 2004   Dexa, hip  . PMB  (postmenopausal bleeding)   . Varicose veins     Past Surgical History:  Procedure Laterality Date  . APPENDECTOMY    . COLONOSCOPY    . DILATATION & CURETTAGE/HYSTEROSCOPY WITH MYOSURE N/A 09/18/2018   Procedure: DILATATION & CURETTAGE/HYSTEROSCOPY WITH MYOSURE;  Surgeon: Servando Salina, MD;  Location: [place];  Service: Gynecology;  Laterality: N/A;  . ORIF SHOULDER FRACTURE Right 11/18/2013   Procedure: OPEN REDUCTION INTERNAL FIXATION (ORIF) RIGHT SHOULDER TUBEROSITY FRACTURE;  Surgeon: Marin Shutter, MD;  Location: [place];  Service: Orthopedics;  Laterality: Right;  . TONSILLECTOMY    . VARICOSE VEIN SURGERY  2014   laser     Social History   Tobacco Use  . Smoking status: Never Smoker  . Smokeless tobacco: Never Used  Vaping Use  . Vaping Use: Never  used  Substance Use Topics  . Alcohol use: Not Currently    Alcohol/week: 0.0 standard drinks    Comment: socially   . Drug use: No    Family History  Problem Relation Age of Onset  . Other Mother        valve replacement--M  . Breast cancer Mother        M late 46s  . Heart failure Mother        M  . COPD Father        smoker  . Diabetes Other        GPs , late onset  . Colon polyps Sister   . Heart attack Neg Hx   . Colon cancer Neg Hx   . Esophageal cancer Neg Hx   . Stomach cancer Neg Hx   . Rectal cancer Neg Hx     Allergies  Allergen Reactions  . Penicillins Rash    CHILDHOOD ALLERGY    Medication list has been reviewed and updated.  Current Outpatient Medications on File Prior to Visit  Medication Sig Dispense Refill  . ALPRAZolam (XANAX) 0.5 MG tablet TAKE 1/2 TO 1 (ONE-HALF TO ONE) TABLET BY MOUTH THREE TIMES DAILY AS NEEDED FOR ANXIETY OR  SLEEP 90 tablet 1  . buPROPion (WELLBUTRIN XL) 150 MG 24 hr tablet Take 1 tablet (150 mg total) by mouth daily. 90 tablet 3  . Calcium Carb-Cholecalciferol (CALCIUM 1000 + D PO) Take 1,000 mg by mouth daily.    . carvedilol (COREG) 25 MG  tablet Take 1 tablet (25 mg total) by mouth 2 (two) times daily with a meal. 180 tablet 3  . Cyanocobalamin (B-12 PO) Take by mouth.    . fexofenadine (ALLEGRA) 180 MG tablet Take 180 mg by mouth daily.    . fluticasone (FLONASE) 50 MCG/ACT nasal spray Place 1-2 sprays into both nostrils daily.    . traMADol (ULTRAM) 50 MG tablet TAKE 1 TABLET BY MOUTH ONCE DAILY AS NEEDED 30 tablet 1   No current facility-administered medications on file prior to visit.    Review of Systems:  As per HPI- otherwise negative.   Physical Examination: There were no vitals filed for this visit. There were no vitals filed for this visit. There is no height or weight on file to calculate BMI. Ideal Body Weight:    GEN: no acute distress. HEENT: Atraumatic, Normocephalic.  Ears and Nose: No external deformity. CV: RRR, No M/G/R. No JVD. No thrill. No extra heart sounds. PULM: CTA B, no wheezes, crackles, rhonchi. No retractions. No resp. distress. No accessory muscle use. ABD: S, NT, ND, +BS. No rebound. No HSM. EXTR: No c/c/e PSYCH: Normally interactive. Conversant.    Assessment and Plan: ***  Signed Lamar Blinks, MD

## 2020-09-15 ENCOUNTER — Encounter: Payer: 59 | Admitting: Internal Medicine

## 2020-09-19 LAB — COMPREHENSIVE METABOLIC PANEL
Albumin: 4.1 (ref 3.5–5.0)
Calcium: 9.1 (ref 8.7–10.7)
GFR calc non Af Amer: 49
Globulin: 2.6

## 2020-09-19 LAB — LIPID PANEL
Cholesterol: 195 (ref 0–200)
HDL: 52 (ref 35–70)
LDL Cholesterol: 125
Triglycerides: 101 (ref 40–160)

## 2020-09-19 LAB — HEMOGLOBIN A1C: Hemoglobin A1C: 5.2

## 2020-09-19 LAB — CBC: RBC: 4.97 (ref 3.87–5.11)

## 2020-09-19 LAB — CBC AND DIFFERENTIAL
HCT: 43 (ref 36–46)
Hemoglobin: 14.9 (ref 12.0–16.0)
Platelets: 226 (ref 150–399)
WBC: 6.3

## 2020-09-19 LAB — HM MAMMOGRAPHY

## 2020-09-19 LAB — HEPATIC FUNCTION PANEL
ALT: 6 — AB (ref 7–35)
AST: 21 (ref 13–35)
Alkaline Phosphatase: 122 (ref 25–125)
Bilirubin, Total: 0.4

## 2020-09-19 LAB — BASIC METABOLIC PANEL
BUN: 14 (ref 4–21)
CO2: 19 (ref 13–22)
Chloride: 106 (ref 99–108)
Creatinine: 1.2 — AB (ref 0.5–1.1)
Glucose: 92
Potassium: 4.8 (ref 3.4–5.3)
Sodium: 139 (ref 137–147)

## 2020-09-19 LAB — TSH: TSH: 1.71 (ref 0.41–5.90)

## 2020-10-02 ENCOUNTER — Encounter: Payer: Self-pay | Admitting: Internal Medicine

## 2020-10-04 ENCOUNTER — Encounter: Payer: Self-pay | Admitting: Internal Medicine

## 2020-10-10 ENCOUNTER — Ambulatory Visit (INDEPENDENT_AMBULATORY_CARE_PROVIDER_SITE_OTHER): Payer: 59 | Admitting: Internal Medicine

## 2020-10-10 ENCOUNTER — Other Ambulatory Visit: Payer: Self-pay

## 2020-10-10 VITALS — BP 136/84 | HR 66 | Temp 97.9°F | Resp 16 | Ht 65.0 in | Wt 230.4 lb

## 2020-10-10 DIAGNOSIS — F419 Anxiety disorder, unspecified: Secondary | ICD-10-CM | POA: Diagnosis not present

## 2020-10-10 DIAGNOSIS — Z79899 Other long term (current) drug therapy: Secondary | ICD-10-CM

## 2020-10-10 DIAGNOSIS — I10 Essential (primary) hypertension: Secondary | ICD-10-CM | POA: Diagnosis not present

## 2020-10-10 DIAGNOSIS — F32A Depression, unspecified: Secondary | ICD-10-CM

## 2020-10-10 MED ORDER — FLUOXETINE HCL 20 MG PO TABS: 10.0000 mg | ORAL_TABLET | Freq: Two times a day (BID) | ORAL | 1 refills | Status: DC

## 2020-10-10 NOTE — Progress Notes (Signed)
Subjective:    Patient ID: Jessica Huang, female    DOB: 1955-10-29, 65 y.o.   MRN: [id number]  DOS:  10/10/2020 Type of visit - description: Acute  Back in May, she was very stressed about some family issues.  She started to take Xanax a little more often, perhaps up to 2 times a day. Eventually about 10 days ago she decided to go back on fluoxetine and is currently taking 10 mg twice daily. She also reached out for a counselor. Overall is feeling better. Denies any suicidal ideas.  HTN: Good compliance with medication, BP at some point in the last week was 140/90.  Change medication?  Review of Systems See above   Past Medical History:  Diagnosis Date   Allergy    Anxiety    takes Xanax daily   Anxiety and depression    takes Wellbutrin and Prozac daily   Back pain    buldging disc   Cataract    early   GERD (gastroesophageal reflux disease) 09-2012   dx by ENT    Headache(784.0) last time at least a yr ago   migraine-occasionally   Osteopenia 2004   Dexa, hip   PMB (postmenopausal bleeding)    Varicose veins     Past Surgical History:  Procedure Laterality Date   APPENDECTOMY     COLONOSCOPY     DILATATION & CURETTAGE/HYSTEROSCOPY WITH MYOSURE N/A 09/18/2018   Procedure: [place];  Surgeon: Servando Salina, MD;  Location: [place];  Service: Gynecology;  Laterality: N/A;   ORIF SHOULDER FRACTURE Right 11/18/2013   Procedure: OPEN REDUCTION INTERNAL FIXATION (ORIF) RIGHT SHOULDER TUBEROSITY FRACTURE;  Surgeon: Marin Shutter, MD;  Location: [facility];  Service: Orthopedics;  Laterality: Right;   TONSILLECTOMY     VARICOSE VEIN SURGERY  2014   laser     Allergies as of 10/10/2020       Reactions   Penicillins Rash   CHILDHOOD ALLERGY        Medication List        Accurate as of October 10, 2020 11:59 PM. If you have any questions, ask your nurse or doctor.          ALPRAZolam 0.5 MG  tablet Commonly known as: XANAX TAKE 1/2 TO 1 (ONE-HALF TO ONE) TABLET BY MOUTH THREE TIMES DAILY AS NEEDED FOR ANXIETY OR  SLEEP   B-12 PO Take by mouth.   buPROPion 150 MG 24 hr tablet Commonly known as: WELLBUTRIN XL Take 1 tablet (150 mg total) by mouth daily.   CALCIUM 1000 + D PO Take 1,000 mg by mouth daily.   carvedilol 25 MG tablet Commonly known as: COREG Take 1 tablet (25 mg total) by mouth 2 (two) times daily with a meal.   fexofenadine 180 MG tablet Commonly known as: ALLEGRA Take 180 mg by mouth daily.   FLUoxetine 20 MG tablet Commonly known as: PROZAC Take 0.5 tablets (10 mg total) by mouth 2 (two) times daily.   fluticasone 50 MCG/ACT nasal spray Commonly known as: FLONASE Place 1-2 sprays into both nostrils daily.   traMADol 50 MG tablet Commonly known as: ULTRAM TAKE 1 TABLET BY MOUTH ONCE DAILY AS NEEDED           Objective:   Physical Exam BP 136/84 (BP Location: Left Arm, Patient Position: Sitting, Cuff Size: Normal)   Pulse 66   Temp 97.9 F (36.6 C) (Oral)   Resp 16  Ht 5\' 5"  (1.651 m)   Wt 230 lb 6 oz (104.5 kg)   SpO2 98%   BMI 38.34 kg/m  General:   Well developed, NAD, BMI noted. HEENT:  Normocephalic . Face symmetric, atraumatic Skin: Not pale. Not jaundice Neurologic:  alert & oriented X3.  Speech normal, gait appropriate for age and unassisted Psych--  Cognition and judgment appear intact.  Cooperative with normal attention span and concentration.  Behavior appropriate. No anxious or depressed appearing.      Assessment     Assessment HTN Anxiety depression Osteopenia, last dexa 06-2015 MSK: Back pain, hip pain GERD DX by ENT 2014 Menopause age ~ 37  PLAN Anxiety, depression: Up until last visit, was well controlled Wellbutrin and Xanax, back in May, she had a lot of stress, family related (did not like to give me any details) and symptoms increased. She up her  Xanax intake to ~  once or twice daily. Self  restarted fluoxetine about 10 days ago, currently 10 mg twice daily. She is also seen a Social worker. Overall, symptoms are under better control.  We agreed to continue present care including seeing a counselor and she will let me know if fluoxetine needs to be increased in the near future. HTN: On carvedilol, BP was elevated 1 time last week, 140/90.  Recommend no change for now, monitor BPs to 3 times a week.  Call if not controlled. Preventive care: Recommend COVID-vaccine #3, she is somewhat hesitant RTC 2 months CPX     This visit occurred during the SARS-CoV-2 public health emergency.  Safety protocols were in place, including screening questions prior to the visit, additional usage of staff PPE, and extensive cleaning of exam room while observing appropriate contact time as indicated for disinfecting solutions.

## 2020-10-10 NOTE — Patient Instructions (Addendum)
Check the  blood pressure 2 or 3 times a week BP GOAL is between 110/65 and  135/85. If it is consistently higher or lower, let me know     GO TO THE LAB : Provide a urine sample   GO TO THE FRONT DESK, PLEASE SCHEDULE YOUR APPOINTMENTS Come back for a physical exam in 2 to 3 months

## 2020-10-11 NOTE — Assessment & Plan Note (Signed)
Anxiety, depression: Up until last visit, was well controlled Wellbutrin and Xanax, back in May, she had a lot of stress, family related (did not like to give me any details) and symptoms increased. She up her  Xanax intake to ~  once or twice daily. Self restarted fluoxetine about 10 days ago, currently 10 mg twice daily. She is also seen a Social worker. Overall, symptoms are under better control.  We agreed to continue present care including seeing a counselor and she will let me know if fluoxetine needs to be increased in the near future. HTN: On carvedilol, BP was elevated 1 time last week, 140/90.  Recommend no change for now, monitor BPs to 3 times a week.  Call if not controlled. Preventive care: Recommend COVID-vaccine #3, she is somewhat hesitant RTC 2 months CPX

## 2020-10-20 LAB — DM TEMPLATE

## 2020-10-20 LAB — DRUG MONITORING, PANEL 8 WITH CONFIRMATION, URINE
6 Acetylmorphine: NEGATIVE ng/mL (ref <10)
Alcohol Metabolites: NEGATIVE ng/mL (ref <500)
Alphahydroxyalprazolam: 233 ng/mL — ABNORMAL HIGH (ref <25)
Alphahydroxymidazolam: NEGATIVE ng/mL (ref <50)
Alphahydroxytriazolam: NEGATIVE ng/mL (ref <50)
Aminoclonazepam: NEGATIVE ng/mL (ref <25)
Amphetamines: NEGATIVE ng/mL (ref <500)
Benzodiazepines: POSITIVE ng/mL — AB (ref <100)
Buprenorphine, Urine: NEGATIVE ng/mL (ref <5)
Cocaine Metabolite: NEGATIVE ng/mL (ref <150)
Creatinine: 176.2 mg/dL (ref >=20.0)
Hydroxyethylflurazepam: NEGATIVE ng/mL (ref <50)
Lorazepam: NEGATIVE ng/mL (ref <50)
MDMA: NEGATIVE ng/mL (ref <500)
Marijuana Metabolite: NEGATIVE ng/mL (ref <20)
Nordiazepam: NEGATIVE ng/mL (ref <50)
Opiates: NEGATIVE ng/mL (ref <100)
Oxazepam: NEGATIVE ng/mL (ref <50)
Oxidant: NEGATIVE ug/mL (ref <200)
Oxycodone: NEGATIVE ng/mL (ref <100)
Temazepam: NEGATIVE ng/mL (ref <50)
pH: 5.3 (ref 4.5–9.0)

## 2020-10-20 LAB — DRUG MONITOR, TRAMADOL,QN, URINE
Desmethyltramadol: 151 ng/mL — ABNORMAL HIGH (ref <100)
Tramadol: 2088 ng/mL — ABNORMAL HIGH (ref <100)

## 2020-10-23 ENCOUNTER — Other Ambulatory Visit: Payer: Self-pay

## 2020-10-23 ENCOUNTER — Telehealth: Payer: Self-pay

## 2020-10-23 MED ORDER — ALPRAZOLAM 0.5 MG PO TABS: ORAL_TABLET | ORAL | 1 refills | Status: DC

## 2020-10-23 MED ORDER — BUPROPION HCL ER (XL) 150 MG PO TB24: 150.0000 mg | ORAL_TABLET | Freq: Every day | ORAL | 1 refills | Status: DC

## 2020-10-23 NOTE — Telephone Encounter (Signed)
Requesting: alprazolam 0.5mg  Contract: 10/10/2020 UDS: 10/10/2020 Last Visit: 10/10/2020 Next Visit: 12/08/2020 Last Refill: 03/22/2020 #90 and 1RF Pt sig: 1 tab tid prn  Please Advise

## 2020-10-23 NOTE — Telephone Encounter (Signed)
Pdmp ok, rx sent  ? ?

## 2020-11-21 DIAGNOSIS — M9905 Segmental and somatic dysfunction of pelvic region: Secondary | ICD-10-CM | POA: Diagnosis not present

## 2020-11-21 DIAGNOSIS — M9901 Segmental and somatic dysfunction of cervical region: Secondary | ICD-10-CM | POA: Diagnosis not present

## 2020-11-21 DIAGNOSIS — M9902 Segmental and somatic dysfunction of thoracic region: Secondary | ICD-10-CM | POA: Diagnosis not present

## 2020-11-21 DIAGNOSIS — M9903 Segmental and somatic dysfunction of lumbar region: Secondary | ICD-10-CM | POA: Diagnosis not present

## 2020-11-23 ENCOUNTER — Encounter: Payer: 59 | Admitting: Internal Medicine

## 2020-11-25 DIAGNOSIS — S80212A Abrasion, left knee, initial encounter: Secondary | ICD-10-CM | POA: Diagnosis not present

## 2020-11-25 DIAGNOSIS — S60416A Abrasion of right little finger, initial encounter: Secondary | ICD-10-CM | POA: Diagnosis not present

## 2020-11-25 DIAGNOSIS — W19XXXA Unspecified fall, initial encounter: Secondary | ICD-10-CM | POA: Diagnosis not present

## 2020-11-25 DIAGNOSIS — S01501A Unspecified open wound of lip, initial encounter: Secondary | ICD-10-CM | POA: Diagnosis not present

## 2020-11-25 DIAGNOSIS — S01511A Laceration without foreign body of lip, initial encounter: Secondary | ICD-10-CM | POA: Diagnosis not present

## 2020-11-25 DIAGNOSIS — S0181XA Laceration without foreign body of other part of head, initial encounter: Secondary | ICD-10-CM | POA: Diagnosis not present

## 2020-11-25 DIAGNOSIS — Y998 Other external cause status: Secondary | ICD-10-CM | POA: Diagnosis not present

## 2020-11-25 DIAGNOSIS — I10 Essential (primary) hypertension: Secondary | ICD-10-CM | POA: Diagnosis not present

## 2020-11-25 DIAGNOSIS — S80211A Abrasion, right knee, initial encounter: Secondary | ICD-10-CM | POA: Diagnosis not present

## 2020-12-05 DIAGNOSIS — R079 Chest pain, unspecified: Secondary | ICD-10-CM | POA: Insufficient documentation

## 2020-12-05 DIAGNOSIS — F411 Generalized anxiety disorder: Secondary | ICD-10-CM | POA: Diagnosis not present

## 2020-12-05 DIAGNOSIS — F32A Depression, unspecified: Secondary | ICD-10-CM | POA: Diagnosis not present

## 2020-12-05 DIAGNOSIS — M79605 Pain in left leg: Secondary | ICD-10-CM | POA: Diagnosis not present

## 2020-12-05 DIAGNOSIS — M79604 Pain in right leg: Secondary | ICD-10-CM | POA: Diagnosis not present

## 2020-12-05 DIAGNOSIS — I25111 Atherosclerotic heart disease of native coronary artery with angina pectoris with documented spasm: Secondary | ICD-10-CM | POA: Diagnosis not present

## 2020-12-05 DIAGNOSIS — R0789 Other chest pain: Secondary | ICD-10-CM | POA: Diagnosis not present

## 2020-12-05 DIAGNOSIS — Z79899 Other long term (current) drug therapy: Secondary | ICD-10-CM | POA: Diagnosis not present

## 2020-12-05 DIAGNOSIS — Z6836 Body mass index (BMI) 36.0-36.9, adult: Secondary | ICD-10-CM | POA: Diagnosis not present

## 2020-12-05 DIAGNOSIS — M7989 Other specified soft tissue disorders: Secondary | ICD-10-CM | POA: Diagnosis not present

## 2020-12-05 DIAGNOSIS — I493 Ventricular premature depolarization: Secondary | ICD-10-CM | POA: Diagnosis not present

## 2020-12-05 DIAGNOSIS — R69 Illness, unspecified: Secondary | ICD-10-CM | POA: Diagnosis not present

## 2020-12-05 DIAGNOSIS — I83892 Varicose veins of left lower extremities with other complications: Secondary | ICD-10-CM | POA: Diagnosis not present

## 2020-12-05 DIAGNOSIS — I10 Essential (primary) hypertension: Secondary | ICD-10-CM | POA: Diagnosis not present

## 2020-12-05 DIAGNOSIS — E663 Overweight: Secondary | ICD-10-CM | POA: Diagnosis not present

## 2020-12-06 DIAGNOSIS — R0789 Other chest pain: Secondary | ICD-10-CM | POA: Diagnosis not present

## 2020-12-06 DIAGNOSIS — M25512 Pain in left shoulder: Secondary | ICD-10-CM | POA: Diagnosis not present

## 2020-12-06 DIAGNOSIS — Z7982 Long term (current) use of aspirin: Secondary | ICD-10-CM | POA: Diagnosis not present

## 2020-12-06 DIAGNOSIS — J9811 Atelectasis: Secondary | ICD-10-CM | POA: Diagnosis not present

## 2020-12-06 DIAGNOSIS — R079 Chest pain, unspecified: Secondary | ICD-10-CM | POA: Diagnosis not present

## 2020-12-06 DIAGNOSIS — J90 Pleural effusion, not elsewhere classified: Secondary | ICD-10-CM | POA: Diagnosis not present

## 2020-12-06 DIAGNOSIS — K449 Diaphragmatic hernia without obstruction or gangrene: Secondary | ICD-10-CM | POA: Diagnosis not present

## 2020-12-06 DIAGNOSIS — R69 Illness, unspecified: Secondary | ICD-10-CM | POA: Diagnosis not present

## 2020-12-06 DIAGNOSIS — I493 Ventricular premature depolarization: Secondary | ICD-10-CM | POA: Diagnosis not present

## 2020-12-06 DIAGNOSIS — I10 Essential (primary) hypertension: Secondary | ICD-10-CM | POA: Diagnosis not present

## 2020-12-07 DIAGNOSIS — Z7982 Long term (current) use of aspirin: Secondary | ICD-10-CM | POA: Diagnosis not present

## 2020-12-07 DIAGNOSIS — I10 Essential (primary) hypertension: Secondary | ICD-10-CM | POA: Diagnosis not present

## 2020-12-07 DIAGNOSIS — R9431 Abnormal electrocardiogram [ECG] [EKG]: Secondary | ICD-10-CM | POA: Diagnosis not present

## 2020-12-07 DIAGNOSIS — R079 Chest pain, unspecified: Secondary | ICD-10-CM | POA: Diagnosis not present

## 2020-12-07 DIAGNOSIS — R0789 Other chest pain: Secondary | ICD-10-CM | POA: Diagnosis not present

## 2020-12-07 DIAGNOSIS — M25512 Pain in left shoulder: Secondary | ICD-10-CM | POA: Diagnosis not present

## 2020-12-08 ENCOUNTER — Encounter: Payer: 59 | Admitting: Internal Medicine

## 2020-12-15 DIAGNOSIS — E785 Hyperlipidemia, unspecified: Secondary | ICD-10-CM | POA: Diagnosis not present

## 2020-12-15 DIAGNOSIS — I10 Essential (primary) hypertension: Secondary | ICD-10-CM | POA: Diagnosis not present

## 2020-12-15 DIAGNOSIS — R072 Precordial pain: Secondary | ICD-10-CM | POA: Diagnosis not present

## 2020-12-26 ENCOUNTER — Other Ambulatory Visit: Payer: Self-pay

## 2020-12-26 ENCOUNTER — Ambulatory Visit (INDEPENDENT_AMBULATORY_CARE_PROVIDER_SITE_OTHER): Payer: Medicare HMO | Admitting: Internal Medicine

## 2020-12-26 ENCOUNTER — Encounter: Payer: Self-pay | Admitting: Internal Medicine

## 2020-12-26 VITALS — BP 122/82 | HR 64 | Temp 97.8°F | Resp 16 | Ht 65.0 in | Wt 231.0 lb

## 2020-12-26 DIAGNOSIS — E785 Hyperlipidemia, unspecified: Secondary | ICD-10-CM | POA: Diagnosis not present

## 2020-12-26 DIAGNOSIS — F32A Depression, unspecified: Secondary | ICD-10-CM

## 2020-12-26 DIAGNOSIS — I10 Essential (primary) hypertension: Secondary | ICD-10-CM | POA: Diagnosis not present

## 2020-12-26 DIAGNOSIS — Z0001 Encounter for general adult medical examination with abnormal findings: Secondary | ICD-10-CM

## 2020-12-26 DIAGNOSIS — R079 Chest pain, unspecified: Secondary | ICD-10-CM | POA: Diagnosis not present

## 2020-12-26 DIAGNOSIS — R69 Illness, unspecified: Secondary | ICD-10-CM | POA: Diagnosis not present

## 2020-12-26 DIAGNOSIS — Z0000 Encounter for general adult medical examination without abnormal findings: Secondary | ICD-10-CM

## 2020-12-26 DIAGNOSIS — Z23 Encounter for immunization: Secondary | ICD-10-CM

## 2020-12-26 DIAGNOSIS — F419 Anxiety disorder, unspecified: Secondary | ICD-10-CM

## 2020-12-26 MED ORDER — CARVEDILOL 6.25 MG PO TABS: 6.2500 mg | ORAL_TABLET | Freq: Two times a day (BID) | ORAL | Status: DC

## 2020-12-26 NOTE — Progress Notes (Signed)
Subjective:    Patient ID: Jessica Huang, female    DOB: 09-25-1955, 65 y.o.   MRN: [id number]  DOS:  12/26/2020 Type of visit - description: CPX  In addition to CPX, we talk about her chronic medical problems. Also, she went to the hospital with chest pain, work-up was done  >>  reviewed. Currently asymptomatic.   Review of Systems  Other than above, a 14 point review of systems is negative      Past Medical History:  Diagnosis Date   Allergy    Anxiety    takes Xanax daily   Anxiety and depression    takes Wellbutrin and Prozac daily   Back pain    buldging disc   Cataract    early   GERD (gastroesophageal reflux disease) 09-2012   dx by ENT    Headache(784.0) last time at least a yr ago   migraine-occasionally   Osteopenia 2004   Dexa, hip   PMB (postmenopausal bleeding)    Varicose veins     Past Surgical History:  Procedure Laterality Date   APPENDECTOMY     COLONOSCOPY     DILATATION & CURETTAGE/HYSTEROSCOPY WITH MYOSURE N/A 09/18/2018   Procedure: [place];  Surgeon: Servando Salina, MD;  Location: [facility];  Service: Gynecology;  Laterality: N/A;   ORIF SHOULDER FRACTURE Right 11/18/2013   Procedure: OPEN REDUCTION INTERNAL FIXATION (ORIF) RIGHT SHOULDER TUBEROSITY FRACTURE;  Surgeon: Marin Shutter, MD;  Location: [facility];  Service: Orthopedics;  Laterality: Right;   TONSILLECTOMY     VARICOSE VEIN SURGERY  2014   laser    Social History   Socioeconomic History   Marital status: Divorced    Spouse name: Not on file   Number of children: 2   Years of education: Not on file   Highest education level: Not on file  Occupational History   Occupation: retired Pharmacist, hospital   Tobacco Use   Smoking status: Never   Smokeless tobacco: Never  Vaping Use   Vaping Use: Never used  Substance and Sexual Activity   Alcohol use: Not Currently    Alcohol/week: 0.0 standard drinks    Comment: socially     Drug use: No   Sexual activity: Not Currently    Birth control/protection: Post-menopausal  Other Topics Concern   Not on file  Social History Narrative   divorce, 2 children, one lives at home [place],  daughter got married 10-2015   Social Determinants of Health   Financial Resource Strain: Not on file  Food Insecurity: Not on file  Transportation Needs: Not on file  Physical Activity: Not on file  Stress: Not on file  Social Connections: Not on file  Intimate Partner Violence: Not on file     Allergies as of 12/26/2020       Reactions   Penicillins Rash   CHILDHOOD ALLERGY        Medication List        Accurate as of December 26, 2020 11:59 PM. If you have any questions, ask your nurse or doctor.          STOP taking these medications    B-12 PO Stopped by: Kathlene November, MD   fexofenadine 180 MG tablet Commonly known as: ALLEGRA Stopped by: Kathlene November, MD       TAKE these medications    ALPRAZolam 0.5 MG tablet Commonly known as: XANAX TAKE 1/2 TO 1 (ONE-HALF TO ONE) TABLET BY  MOUTH THREE TIMES DAILY AS NEEDED FOR ANXIETY OR  SLEEP   amLODipine 5 MG tablet Commonly known as: NORVASC Take 5 mg by mouth daily.   aspirin 81 MG EC tablet Take 1 tablet by mouth daily.   atorvastatin 10 MG tablet Commonly known as: LIPITOR Take 10 mg by mouth daily.   buPROPion 150 MG 24 hr tablet Commonly known as: WELLBUTRIN XL Take 1 tablet (150 mg total) by mouth daily.   CALCIUM 1000 + D PO Take 1,000 mg by mouth daily.   carvedilol 6.25 MG tablet Commonly known as: COREG Take 1 tablet (6.25 mg total) by mouth 2 (two) times daily with a meal. What changed:  medication strength how much to take Changed by: Kathlene November, MD   FLUoxetine 20 MG tablet Commonly known as: PROZAC Take 0.5 tablets (10 mg total) by mouth 2 (two) times daily.   fluticasone 50 MCG/ACT nasal spray Commonly known as: FLONASE Place 1-2 sprays into both nostrils daily.   isosorbide  mononitrate 30 MG 24 hr tablet Commonly known as: IMDUR Take 30 mg by mouth daily.   traMADol 50 MG tablet Commonly known as: ULTRAM TAKE 1 TABLET BY MOUTH ONCE DAILY AS NEEDED           Objective:   Physical Exam BP 122/82 (BP Location: Left Arm, Patient Position: Sitting, Cuff Size: Normal)   Pulse 64   Temp 97.8 F (36.6 C)   Resp 16   Ht '5\' 5"'$  (1.651 m)   Wt 231 lb (104.8 kg)   SpO2 97%   BMI 38.44 kg/m  General: Well developed, NAD, BMI noted Neck: No  thyromegaly  HEENT:  Normocephalic . Face symmetric, atraumatic Lungs:  CTA B Normal respiratory effort, no intercostal retractions, no accessory muscle use. Heart: RRR,  no murmur.  Abdomen:  Not distended, soft, non-tender. No rebound or rigidity.   Lower extremities: no pretibial edema bilaterally  Skin: Exposed areas without rash. Not pale. Not jaundice Neurologic:  alert & oriented X3.  Speech normal, gait appropriate for age and unassisted Strength symmetric and appropriate for age.  Psych: Cognition and judgment appear intact.  Cooperative with normal attention span and concentration.  Behavior appropriate. No anxious or depressed appearing.     Assessment     Assessment HTN Anxiety depression Osteopenia, last dexa 06-2015 MSK: Back pain, hip pain GERD DX by ENT 2014 Menopause age ~ 67 Chest pain: 11-2020 stress test neg; dx -- esophageal/coronary spasm, Rx amlodipine and isosorbide   PLAN Here for CPX Recent labs done elsewhere: 09/19/2020: CMP, FLP with LDL of 125.  A1c 5.2. 11-2020: LDL 116 (started atorvastatin) HTN: Since the last visit, amlodipine and Imdur were added, carvedilol dose decreased.  Ambulatory BPs 120-140.  Very rarely has a higher number. Plan: Continue present care, monitor BPs. Anxiety and depression: Family related, doing well.  Good compliance with medications.  Sleeping okay.  No change Osteopenia: Per gynecology Chest pain: Was seen at the ER and subsequently by  cardiology 12/15/2020 due to chest pain, nuclear stress test was negative.  Was Rx amlodipine and isosorbide for possible esophageal or coronary spasm. No further eval was recommended. No more sxs. Dyslipidemia:  was started on a low-dose of Lipitor 11-2020 while having chest pain at the hospital, good compliance, no side effects.  Overall asked her CV RF is ~  7% however s I do not see a downside on taking statins.  Will continue. RTC for labs 1 month RTC  checkup 6 months   In addition to CPX, I reviewed labs done elsewhere, we addressed all her chronic medical issues including recent chest pain and dyslipidemia    This visit occurred during the SARS-CoV-2 public health emergency.  Safety protocols were in place, including screening questions prior to the visit, additional usage of staff PPE, and extensive cleaning of exam room while observing appropriate contact time as indicated for disinfecting solutions.

## 2020-12-26 NOTE — Patient Instructions (Addendum)
Vaccines I recommend: Shingrix Flu shot this fall COVID-vaccine booster  Continue checking your blood pressures regularly BP GOAL is between 110/65 and  135/85. If it is consistently higher or lower, let me know    GO TO THE FRONT DESK, [place] back for blood work only in 1 month, fasting, make an appointment.  Come back for a checkup in 6 months    "Living will", "Unity of attorney": Advanced care planning  (If you already have a living will or healthcare power of attorney, please bring the copy to be scanned in your chart.)  Advance care planning is a process that supports adults in  understanding and sharing their preferences regarding future medical care.   The patient's preferences are recorded in documents called Advance Directives.    Advanced directives are completed (and can be modified at any time) while the patient is in full mental capacity.   The documentation should be available at all times to the patient, the family and the healthcare providers.  Bring in a copy to be scanned in your chart is an excellent idea and is recommended   This legal documents direct treatment decision making and/or appoint a surrogate to make the decision if the patient is not capable to do so.    Advance directives can be documented in many types of formats,  documents have names such as:  Lliving will  Durable power of attorney for healthcare (healthcare proxy or healthcare power of attorney)  Combined directives  Physician orders for life-sustaining treatment    More information at:  [email or web address]

## 2020-12-27 ENCOUNTER — Encounter: Payer: Self-pay | Admitting: Internal Medicine

## 2020-12-27 DIAGNOSIS — E785 Hyperlipidemia, unspecified: Secondary | ICD-10-CM | POA: Insufficient documentation

## 2020-12-27 NOTE — Assessment & Plan Note (Signed)
Here for CPX Recent labs done elsewhere: 09/19/2020: CMP, FLP with LDL of 125.  A1c 5.2. 11-2020: LDL 116 (started atorvastatin) HTN: Since the last visit, amlodipine and Imdur were added, carvedilol dose decreased.  Ambulatory BPs 120-140.  Very rarely has a higher number. Plan: Continue present care, monitor BPs. Anxiety and depression: Family related, doing well.  Good compliance with medications.  Sleeping okay.  No change Osteopenia: Per gynecology Chest pain: Was seen at the ER and subsequently by cardiology 12/15/2020 due to chest pain, nuclear stress test was negative.  Was Rx amlodipine and isosorbide for possible esophageal or coronary spasm. No further eval was recommended. No more sxs. Dyslipidemia:  was started on a low-dose of Lipitor 11-2020 while having chest pain at the hospital, good compliance, no side effects.  Overall asked her CV RF is ~  7% however s I do not see a downside on taking statins.  Will continue. RTC for labs 1 month RTC checkup 6 months

## 2020-12-27 NOTE — Assessment & Plan Note (Signed)
-  Td 11/2018 - PNM 20: Today -  zostavax 2017 - shingrex: Rec at the pharmacy - s/p covid vaxs, rec booster, benefits discussed, she is hesitant -   flu shot recommended --Cscope neg 1-09, cscope 09/2017 next per GI -Female care: sees gynecology   +FH breast ca: MMG 09-2020 Dexa @ gyn -Labs: Will come back fasting for CMP, FLP, -Diet and exercise discussed -POA discussed

## 2021-01-13 DIAGNOSIS — I209 Angina pectoris, unspecified: Secondary | ICD-10-CM | POA: Diagnosis not present

## 2021-01-13 DIAGNOSIS — E785 Hyperlipidemia, unspecified: Secondary | ICD-10-CM | POA: Diagnosis not present

## 2021-01-13 DIAGNOSIS — Z880 Allergy status to penicillin: Secondary | ICD-10-CM | POA: Diagnosis not present

## 2021-01-13 DIAGNOSIS — I10 Essential (primary) hypertension: Secondary | ICD-10-CM | POA: Diagnosis not present

## 2021-01-13 DIAGNOSIS — Z7982 Long term (current) use of aspirin: Secondary | ICD-10-CM | POA: Diagnosis not present

## 2021-01-13 DIAGNOSIS — F419 Anxiety disorder, unspecified: Secondary | ICD-10-CM | POA: Diagnosis not present

## 2021-01-13 DIAGNOSIS — F3341 Major depressive disorder, recurrent, in partial remission: Secondary | ICD-10-CM | POA: Diagnosis not present

## 2021-01-13 DIAGNOSIS — G8929 Other chronic pain: Secondary | ICD-10-CM | POA: Diagnosis not present

## 2021-01-13 DIAGNOSIS — Z9181 History of falling: Secondary | ICD-10-CM | POA: Diagnosis not present

## 2021-01-13 DIAGNOSIS — R69 Illness, unspecified: Secondary | ICD-10-CM | POA: Diagnosis not present

## 2021-01-26 ENCOUNTER — Other Ambulatory Visit: Payer: Self-pay

## 2021-01-26 ENCOUNTER — Other Ambulatory Visit (INDEPENDENT_AMBULATORY_CARE_PROVIDER_SITE_OTHER): Payer: Medicare HMO

## 2021-01-26 DIAGNOSIS — Z0001 Encounter for general adult medical examination with abnormal findings: Secondary | ICD-10-CM

## 2021-01-26 LAB — COMPREHENSIVE METABOLIC PANEL
ALT: 9 U/L (ref 0–35)
AST: 21 U/L (ref 0–37)
Albumin: 3.6 g/dL (ref 3.5–5.2)
Alkaline Phosphatase: 124 U/L — ABNORMAL HIGH (ref 39–117)
BUN: 18 mg/dL (ref 6–23)
CO2: 24 mEq/L (ref 19–32)
Calcium: 8.6 mg/dL (ref 8.4–10.5)
Chloride: 107 mEq/L (ref 96–112)
Creatinine, Ser: 1.02 mg/dL (ref 0.40–1.20)
GFR: 57.84 mL/min — ABNORMAL LOW (ref >60.00)
Glucose, Bld: 89 mg/dL (ref 70–99)
Potassium: 4.4 mEq/L (ref 3.5–5.1)
Sodium: 139 mEq/L (ref 135–145)
Total Bilirubin: 0.6 mg/dL (ref 0.2–1.2)
Total Protein: 6.3 g/dL (ref 6.0–8.3)

## 2021-01-26 LAB — LIPID PANEL
Cholesterol: 129 mg/dL (ref 0–200)
HDL: 46.5 mg/dL (ref >39.00)
LDL Cholesterol: 68 mg/dL (ref 0–99)
NonHDL: 82.73
Total CHOL/HDL Ratio: 3
Triglycerides: 73 mg/dL (ref 0.0–149.0)
VLDL: 14.6 mg/dL (ref 0.0–40.0)

## 2021-01-26 NOTE — Addendum Note (Signed)
Addended by: Kelle Darting A on: 01/26/2021 07:59 AM   Modules accepted: Orders

## 2021-01-29 MED ORDER — ATORVASTATIN CALCIUM 10 MG PO TABS: 10.0000 mg | ORAL_TABLET | Freq: Every day | ORAL | 3 refills | Status: DC

## 2021-01-29 NOTE — Addendum Note (Signed)
Addended byDamita Dunnings D on: 01/29/2021 01:38 PM   Modules accepted: Orders

## 2021-01-31 DIAGNOSIS — L910 Hypertrophic scar: Secondary | ICD-10-CM | POA: Diagnosis not present

## 2021-01-31 DIAGNOSIS — Z85828 Personal history of other malignant neoplasm of skin: Secondary | ICD-10-CM | POA: Diagnosis not present

## 2021-01-31 DIAGNOSIS — D1801 Hemangioma of skin and subcutaneous tissue: Secondary | ICD-10-CM | POA: Diagnosis not present

## 2021-02-09 ENCOUNTER — Other Ambulatory Visit (HOSPITAL_BASED_OUTPATIENT_CLINIC_OR_DEPARTMENT_OTHER): Payer: Self-pay

## 2021-02-09 MED ORDER — INFLUENZA VAC A&B SA ADJ QUAD 0.5 ML IM PRSY
PREFILLED_SYRINGE | INTRAMUSCULAR | 0 refills | Status: DC
  Filled 2021-02-09: qty 0.5, 1d supply, fill #0

## 2021-02-26 ENCOUNTER — Telehealth: Payer: Self-pay | Admitting: Internal Medicine

## 2021-02-26 MED ORDER — AMLODIPINE BESYLATE 5 MG PO TABS: 5.0000 mg | ORAL_TABLET | Freq: Every day | ORAL | 1 refills | Status: DC

## 2021-02-26 MED ORDER — ATORVASTATIN CALCIUM 10 MG PO TABS
10.0000 mg | ORAL_TABLET | Freq: Every day | ORAL | 1 refills | Status: DC
Start: 2021-02-26 — End: 2022-03-11

## 2021-02-26 MED ORDER — ISOSORBIDE MONONITRATE ER 30 MG PO TB24: 30.0000 mg | ORAL_TABLET | Freq: Every day | ORAL | 1 refills | Status: DC

## 2021-02-26 NOTE — Telephone Encounter (Signed)
Medication:  isosorbide mononitrate (IMDUR) 30 MG 24 hr tablet [[id number]]  amLODipine (NORVASC) 5 MG tablet [[id number]] atorvastatin (LIPITOR) 10 MG tablet [[id number]]  Has the patient contacted their pharmacy? Yes.   (If no, request that the patient contact the pharmacy for the refill.) (If yes, when and what did the pharmacy advise?) Pt was advised to contact pcp to switch pharmacy.    Preferred Pharmacy (with phone number or street name):  [facility] [address], [place], [place] [address] Phone: [phone]   Agent: Please be advised that RX refills may take up to 3 business days. We ask that you follow-up with your pharmacy.   Pt informed me going forward she would like all medication sent to the [facility] listed above.

## 2021-02-26 NOTE — Telephone Encounter (Signed)
Rxs sent

## 2021-03-13 DIAGNOSIS — H5203 Hypermetropia, bilateral: Secondary | ICD-10-CM | POA: Diagnosis not present

## 2021-03-13 DIAGNOSIS — H52203 Unspecified astigmatism, bilateral: Secondary | ICD-10-CM | POA: Diagnosis not present

## 2021-03-13 DIAGNOSIS — H25013 Cortical age-related cataract, bilateral: Secondary | ICD-10-CM | POA: Diagnosis not present

## 2021-03-13 DIAGNOSIS — H524 Presbyopia: Secondary | ICD-10-CM | POA: Diagnosis not present

## 2021-03-13 DIAGNOSIS — H25041 Posterior subcapsular polar age-related cataract, right eye: Secondary | ICD-10-CM | POA: Diagnosis not present

## 2021-03-13 DIAGNOSIS — Z0100 Encounter for examination of eyes and vision without abnormal findings: Secondary | ICD-10-CM | POA: Diagnosis not present

## 2021-03-13 DIAGNOSIS — H2513 Age-related nuclear cataract, bilateral: Secondary | ICD-10-CM | POA: Diagnosis not present

## 2021-03-14 DIAGNOSIS — L910 Hypertrophic scar: Secondary | ICD-10-CM | POA: Diagnosis not present

## 2021-04-03 ENCOUNTER — Other Ambulatory Visit: Payer: Self-pay | Admitting: Internal Medicine

## 2021-04-09 ENCOUNTER — Other Ambulatory Visit: Payer: Self-pay | Admitting: Internal Medicine

## 2021-04-23 ENCOUNTER — Other Ambulatory Visit: Payer: Self-pay

## 2021-04-23 MED ORDER — BUPROPION HCL ER (XL) 150 MG PO TB24: 150.0000 mg | ORAL_TABLET | Freq: Every day | ORAL | 1 refills | Status: DC

## 2021-04-25 ENCOUNTER — Telehealth: Payer: Self-pay

## 2021-04-25 MED ORDER — ALPRAZOLAM 0.5 MG PO TABS: ORAL_TABLET | ORAL | 1 refills | Status: DC

## 2021-04-25 NOTE — Telephone Encounter (Signed)
Requesting: alprazolam 0.5mg  Contract: 10/10/2020 UDS: 10/10/2020 Last Visit: 12/26/2020 Next Visit: 06/25/2021 Last Refill: 10/23/2020 #90 and 1RF Pt sig: 1/2 to 1 tab tid prn  Please Advise

## 2021-04-25 NOTE — Telephone Encounter (Signed)
PDMP okay, Rx sent 

## 2021-05-07 DIAGNOSIS — L910 Hypertrophic scar: Secondary | ICD-10-CM | POA: Diagnosis not present

## 2021-05-08 DIAGNOSIS — M7989 Other specified soft tissue disorders: Secondary | ICD-10-CM | POA: Diagnosis not present

## 2021-05-08 DIAGNOSIS — I872 Venous insufficiency (chronic) (peripheral): Secondary | ICD-10-CM | POA: Diagnosis not present

## 2021-05-08 DIAGNOSIS — I87393 Chronic venous hypertension (idiopathic) with other complications of bilateral lower extremity: Secondary | ICD-10-CM | POA: Diagnosis not present

## 2021-05-08 DIAGNOSIS — I83893 Varicose veins of bilateral lower extremities with other complications: Secondary | ICD-10-CM | POA: Diagnosis not present

## 2021-05-10 DIAGNOSIS — M9902 Segmental and somatic dysfunction of thoracic region: Secondary | ICD-10-CM | POA: Diagnosis not present

## 2021-05-10 DIAGNOSIS — M9903 Segmental and somatic dysfunction of lumbar region: Secondary | ICD-10-CM | POA: Diagnosis not present

## 2021-05-10 DIAGNOSIS — M9901 Segmental and somatic dysfunction of cervical region: Secondary | ICD-10-CM | POA: Diagnosis not present

## 2021-05-10 DIAGNOSIS — M9905 Segmental and somatic dysfunction of pelvic region: Secondary | ICD-10-CM | POA: Diagnosis not present

## 2021-05-23 DIAGNOSIS — I83891 Varicose veins of right lower extremities with other complications: Secondary | ICD-10-CM | POA: Diagnosis not present

## 2021-05-23 DIAGNOSIS — I83811 Varicose veins of right lower extremities with pain: Secondary | ICD-10-CM | POA: Diagnosis not present

## 2021-06-04 DIAGNOSIS — M7989 Other specified soft tissue disorders: Secondary | ICD-10-CM | POA: Diagnosis not present

## 2021-06-04 DIAGNOSIS — I83812 Varicose veins of left lower extremities with pain: Secondary | ICD-10-CM | POA: Diagnosis not present

## 2021-06-04 DIAGNOSIS — I83892 Varicose veins of left lower extremities with other complications: Secondary | ICD-10-CM | POA: Diagnosis not present

## 2021-06-06 ENCOUNTER — Other Ambulatory Visit: Payer: Self-pay

## 2021-06-06 MED ORDER — CARVEDILOL 6.25 MG PO TABS: 6.2500 mg | ORAL_TABLET | Freq: Two times a day (BID) | ORAL | 0 refills | Status: DC

## 2021-06-25 ENCOUNTER — Ambulatory Visit (INDEPENDENT_AMBULATORY_CARE_PROVIDER_SITE_OTHER): Payer: Medicare HMO | Admitting: Internal Medicine

## 2021-06-25 ENCOUNTER — Encounter: Payer: Self-pay | Admitting: Internal Medicine

## 2021-06-25 VITALS — BP 126/72 | HR 83 | Temp 97.9°F | Resp 16 | Ht 65.0 in | Wt 231.5 lb

## 2021-06-25 DIAGNOSIS — I83891 Varicose veins of right lower extremities with other complications: Secondary | ICD-10-CM | POA: Diagnosis not present

## 2021-06-25 DIAGNOSIS — I10 Essential (primary) hypertension: Secondary | ICD-10-CM

## 2021-06-25 DIAGNOSIS — F419 Anxiety disorder, unspecified: Secondary | ICD-10-CM | POA: Diagnosis not present

## 2021-06-25 DIAGNOSIS — N62 Hypertrophy of breast: Secondary | ICD-10-CM | POA: Diagnosis not present

## 2021-06-25 DIAGNOSIS — K224 Dyskinesia of esophagus: Secondary | ICD-10-CM

## 2021-06-25 DIAGNOSIS — F32A Depression, unspecified: Secondary | ICD-10-CM | POA: Diagnosis not present

## 2021-06-25 DIAGNOSIS — M7989 Other specified soft tissue disorders: Secondary | ICD-10-CM | POA: Diagnosis not present

## 2021-06-25 DIAGNOSIS — I83811 Varicose veins of right lower extremities with pain: Secondary | ICD-10-CM | POA: Diagnosis not present

## 2021-06-25 DIAGNOSIS — R69 Illness, unspecified: Secondary | ICD-10-CM | POA: Diagnosis not present

## 2021-06-25 NOTE — Patient Instructions (Signed)
Check the  blood pressure regularly ?BP GOAL is between 110/65 and  135/85. ?If it is consistently higher or lower, let me know ? ? ?  ? ?GO TO THE FRONT DESK, PLEASE SCHEDULE YOUR APPOINTMENTS ?Come back for  a physical exam in 6 months  ? ?  ?

## 2021-06-25 NOTE — Assessment & Plan Note (Signed)
YDS:[id number] well controlled on amlodipine, carvedilol, Imdur.  Last BMP okay, no change. ?Anxiety depression: Doing great emotionally, on Xanax, Wellbutrin, fluoxetine.  No change for now, she would like to consider adjustments of her medications in few months. ?Esophageal versus coronary spasm: Currently on amlodipine and Imdur, last visit with cardiology was approximately September 2022, they were considering adjusting medication at some point.  She is doing well, no change for now. ?Macromastia: Seems to be causing problems such as neck pain and a indentation in the shoulders ?Preventive care: COVID-vaccine discussed and recommended ?RTC CPX 12-2021 ?  ?

## 2021-06-25 NOTE — Progress Notes (Signed)
? ?Subjective:  ? ? Patient ID: Jessica Huang, female    DOB: [birth year], 66 y.o.   MRN: [id number] ? ?DOS:  06/25/2021 ?Type of visit - description: f/u ? ?Since the last office visit is doing well and have no major concerns. ?She is considering breast reduction surgery.  Has chronic neck and shoulder pain, she thinks macromastia is playing a role. ?Emotionally she is doing great. ? ?Review of Systems ?See above  ? ?Past Medical History:  ?Diagnosis Date  ? Allergy   ? Anxiety   ? takes Xanax daily  ? Anxiety and depression   ? takes Wellbutrin and Prozac daily  ? Back pain   ? buldging disc  ? Cataract   ? early  ? GERD (gastroesophageal reflux disease) 09-2012  ? dx by ENT   ? Headache(784.0) last time at least a yr ago  ? migraine-occasionally  ? Osteopenia 2004  ? Dexa, hip  ? PMB (postmenopausal bleeding)   ? Varicose veins   ? ? ?Past Surgical History:  ?Procedure Laterality Date  ? APPENDECTOMY    ? COLONOSCOPY    ? DILATATION & CURETTAGE/HYSTEROSCOPY WITH MYOSURE N/A 09/18/2018  ? Procedure: [place];  Surgeon: Servando Salina, MD;  Location: [facility];  Service: Gynecology;  Laterality: N/A;  ? ORIF SHOULDER FRACTURE Right 11/18/2013  ? Procedure: OPEN REDUCTION INTERNAL FIXATION (ORIF) RIGHT SHOULDER TUBEROSITY FRACTURE;  Surgeon: Marin Shutter, MD;  Location: [facility];  Service: Orthopedics;  Laterality: Right;  ? TONSILLECTOMY    ? VARICOSE VEIN SURGERY  2014  ? laser   ? ? ?Current Outpatient Medications  ?Medication Instructions  ? ALPRAZolam (XANAX) 0.5 MG tablet TAKE 1/2 TO 1 (ONE-HALF TO ONE) TABLET BY MOUTH THREE TIMES DAILY AS NEEDED FOR ANXIETY OR  SLEEP  ? amLODipine (NORVASC) 5 mg, Oral, Daily  ? atorvastatin (LIPITOR) 10 mg, Oral, Daily  ? buPROPion (WELLBUTRIN XL) 150 mg, Oral, Daily  ? Calcium Carb-Cholecalciferol (CALCIUM 1000 + D PO) 1,000 mg, Oral, Daily  ? carvedilol (COREG) 6.25 mg, Oral, 2 times daily with meals  ? FLUoxetine  (PROZAC) 20 MG tablet TAKE ONE-HALF TABLET BY MOUTH TWICE A DAY  ? fluticasone (FLONASE) 50 MCG/ACT nasal spray 1-2 sprays, Each Nare, Daily  ? isosorbide mononitrate (IMDUR) 30 mg, Oral, Daily  ? traMADol (ULTRAM) 50 MG tablet TAKE 1 TABLET BY MOUTH ONCE DAILY AS NEEDED  ? ? ?   ?Objective:  ? Physical Exam ?BP 126/72 (BP Location: Left Arm, Patient Position: Sitting, Cuff Size: Normal)   Pulse 83   Temp 97.9 ?F (36.6 ?C) (Oral)   Resp 16   Ht '5\' 5"'$  (1.651 m)   Wt 231 lb 8 oz (105 kg)   SpO2 98%   BMI 38.52 kg/m?  ?General:   ?Well developed, NAD, BMI noted. ?HEENT:  ?Normocephalic . Face symmetric, atraumatic ?Macromastia noted, she has indentation in both shoulders from her bra ?Lungs:  ?CTA B ?Normal respiratory effort, no intercostal retractions, no accessory muscle use. ?Heart: RRR,  no murmur.  ?Lower extremities: no pretibial edema bilaterally  ?Skin: Not pale. Not jaundice ?Neurologic:  ?alert & oriented X3.  ?Speech normal, gait appropriate for age and unassisted ?Psych--  ?Cognition and judgment appear intact.  ?Cooperative with normal attention span and concentration.  ?Behavior appropriate. ?No anxious or depressed appearing.  ? ?   ?Assessment   ? ?  ?Assessment ?HTN ?Anxiety depression ?Osteopenia, last dexa 06-2015 ?MSK: Back pain,  hip pain ?GERD DX by ENT 2014 ?Menopause age ~ 41 ?Chest pain: 11-2020 stress test neg; dx -- esophageal/coronary spasm, Rx amlodipine and isosorbide ? ? ?PLAN ?XBD:[id number] well controlled on amlodipine, carvedilol, Imdur.  Last BMP okay, no change. ?Anxiety depression: Doing great emotionally, on Xanax, Wellbutrin, fluoxetine.  No change for now, she would like to consider adjustments of her medications in few months. ?Esophageal versus coronary spasm: Currently on amlodipine and Imdur, last visit with cardiology was approximately September 2022, they were considering adjusting medication at some point.  She is doing well, no change for now. ?Macromastia: Seems to  be causing problems such as neck pain and a indentation in the shoulders ?Preventive care: COVID-vaccine discussed and recommended ?RTC CPX 12-2021 ?  ? ?This visit occurred during the SARS-CoV-2 public health emergency.  Safety protocols were in place, including screening questions prior to the visit, additional usage of staff PPE, and extensive cleaning of exam room while observing appropriate contact time as indicated for disinfecting solutions.  ? ?

## 2021-06-28 DIAGNOSIS — J0101 Acute recurrent maxillary sinusitis: Secondary | ICD-10-CM | POA: Diagnosis not present

## 2021-06-28 DIAGNOSIS — Z9109 Other allergy status, other than to drugs and biological substances: Secondary | ICD-10-CM | POA: Diagnosis not present

## 2021-07-06 DIAGNOSIS — J341 Cyst and mucocele of nose and nasal sinus: Secondary | ICD-10-CM | POA: Diagnosis not present

## 2021-07-06 DIAGNOSIS — J3489 Other specified disorders of nose and nasal sinuses: Secondary | ICD-10-CM | POA: Diagnosis not present

## 2021-07-06 DIAGNOSIS — J0101 Acute recurrent maxillary sinusitis: Secondary | ICD-10-CM | POA: Diagnosis not present

## 2021-07-09 DIAGNOSIS — I83812 Varicose veins of left lower extremities with pain: Secondary | ICD-10-CM | POA: Diagnosis not present

## 2021-07-09 DIAGNOSIS — M7989 Other specified soft tissue disorders: Secondary | ICD-10-CM | POA: Diagnosis not present

## 2021-07-09 DIAGNOSIS — I83892 Varicose veins of left lower extremities with other complications: Secondary | ICD-10-CM | POA: Diagnosis not present

## 2021-07-23 DIAGNOSIS — I83891 Varicose veins of right lower extremities with other complications: Secondary | ICD-10-CM | POA: Diagnosis not present

## 2021-07-23 DIAGNOSIS — I872 Venous insufficiency (chronic) (peripheral): Secondary | ICD-10-CM | POA: Diagnosis not present

## 2021-08-02 DIAGNOSIS — M9901 Segmental and somatic dysfunction of cervical region: Secondary | ICD-10-CM | POA: Diagnosis not present

## 2021-08-02 DIAGNOSIS — M9902 Segmental and somatic dysfunction of thoracic region: Secondary | ICD-10-CM | POA: Diagnosis not present

## 2021-08-02 DIAGNOSIS — M9903 Segmental and somatic dysfunction of lumbar region: Secondary | ICD-10-CM | POA: Diagnosis not present

## 2021-08-02 DIAGNOSIS — M9905 Segmental and somatic dysfunction of pelvic region: Secondary | ICD-10-CM | POA: Diagnosis not present

## 2021-08-06 DIAGNOSIS — I872 Venous insufficiency (chronic) (peripheral): Secondary | ICD-10-CM | POA: Diagnosis not present

## 2021-08-06 DIAGNOSIS — I83892 Varicose veins of left lower extremities with other complications: Secondary | ICD-10-CM | POA: Diagnosis not present

## 2021-08-22 ENCOUNTER — Other Ambulatory Visit: Payer: Self-pay

## 2021-08-22 ENCOUNTER — Other Ambulatory Visit: Payer: Self-pay | Admitting: Internal Medicine

## 2021-08-22 MED ORDER — FLUOXETINE HCL 20 MG PO TABS: 10.0000 mg | ORAL_TABLET | Freq: Two times a day (BID) | ORAL | 1 refills | Status: DC

## 2021-08-22 MED ORDER — AMLODIPINE BESYLATE 5 MG PO TABS: 5.0000 mg | ORAL_TABLET | Freq: Every day | ORAL | 1 refills | Status: DC

## 2021-08-27 ENCOUNTER — Other Ambulatory Visit: Payer: Self-pay | Admitting: Internal Medicine

## 2021-09-05 ENCOUNTER — Other Ambulatory Visit: Payer: Self-pay | Admitting: Internal Medicine

## 2021-09-11 ENCOUNTER — Telehealth: Payer: Self-pay | Admitting: Internal Medicine

## 2021-09-11 MED ORDER — TRAMADOL HCL 50 MG PO TABS: 50.0000 mg | ORAL_TABLET | Freq: Every day | ORAL | 0 refills | Status: DC | PRN

## 2021-09-11 NOTE — Telephone Encounter (Signed)
Requesting: tramadol '50mg'$   Contract:10/10/20 UDS: 10/10/20 Last Visit: 06/25/21 Next Visit:  12/27/21 Last Refill: 03/22/20 #30 and 1RF  Please Advise

## 2021-09-11 NOTE — Telephone Encounter (Signed)
PDMP okay, Rx sent 

## 2021-09-11 NOTE — Telephone Encounter (Signed)
Pt would like a new rx for tramadol. She sated she does not need an appt for this.   [facility] [address] - [place], [place] - [address]   [address], [place] [address]  Phone:  [phone]  Fax:  [phone]  DEA #:  --

## 2021-10-04 DIAGNOSIS — Z1329 Encounter for screening for other suspected endocrine disorder: Secondary | ICD-10-CM | POA: Diagnosis not present

## 2021-10-04 DIAGNOSIS — Z01419 Encounter for gynecological examination (general) (routine) without abnormal findings: Secondary | ICD-10-CM | POA: Diagnosis not present

## 2021-10-04 DIAGNOSIS — Z1231 Encounter for screening mammogram for malignant neoplasm of breast: Secondary | ICD-10-CM | POA: Diagnosis not present

## 2021-10-04 DIAGNOSIS — Z0142 Encounter for cervical smear to confirm findings of recent normal smear following initial abnormal smear: Secondary | ICD-10-CM | POA: Diagnosis not present

## 2021-10-04 DIAGNOSIS — Z01411 Encounter for gynecological examination (general) (routine) with abnormal findings: Secondary | ICD-10-CM | POA: Diagnosis not present

## 2021-10-04 DIAGNOSIS — Z124 Encounter for screening for malignant neoplasm of cervix: Secondary | ICD-10-CM | POA: Diagnosis not present

## 2021-10-04 DIAGNOSIS — Z6841 Body Mass Index (BMI) 40.0 and over, adult: Secondary | ICD-10-CM | POA: Diagnosis not present

## 2021-10-04 DIAGNOSIS — Z13220 Encounter for screening for lipoid disorders: Secondary | ICD-10-CM | POA: Diagnosis not present

## 2021-10-04 DIAGNOSIS — R87610 Atypical squamous cells of undetermined significance on cytologic smear of cervix (ASC-US): Secondary | ICD-10-CM | POA: Diagnosis not present

## 2021-10-04 DIAGNOSIS — Z0000 Encounter for general adult medical examination without abnormal findings: Secondary | ICD-10-CM | POA: Diagnosis not present

## 2021-10-04 DIAGNOSIS — Z131 Encounter for screening for diabetes mellitus: Secondary | ICD-10-CM | POA: Diagnosis not present

## 2021-10-04 LAB — LIPID PANEL
Cholesterol: 134 (ref 0–200)
HDL: 53 (ref 35–70)
LDL Cholesterol: 64
Triglycerides: 90 (ref 40–160)

## 2021-10-04 LAB — CBC AND DIFFERENTIAL
HCT: 40 (ref 36–46)
Hemoglobin: 13.7 (ref 12.0–16.0)
WBC: 6.6

## 2021-10-04 LAB — HEPATIC FUNCTION PANEL
ALT: 10 U/L (ref 7–35)
AST: 20 (ref 13–35)
Alkaline Phosphatase: 112 (ref 25–125)
Bilirubin, Total: 0.6

## 2021-10-04 LAB — HM MAMMOGRAPHY

## 2021-10-04 LAB — BASIC METABOLIC PANEL
BUN: 12 (ref 4–21)
CO2: 19 (ref 13–22)
Chloride: 109 — AB (ref 99–108)
Creatinine: 1 (ref 0.5–1.1)
Glucose: 98
Potassium: 4.3 mEq/L (ref 3.5–5.1)
Sodium: 143 (ref 137–147)

## 2021-10-04 LAB — COMPREHENSIVE METABOLIC PANEL
Albumin: 3.8 (ref 3.5–5.0)
Calcium: 8.8 (ref 8.7–10.7)
Globulin: 2.3
eGFR: 66

## 2021-10-04 LAB — CBC: RBC: 4.49 (ref 3.87–5.11)

## 2021-10-04 LAB — HEMOGLOBIN A1C: Hemoglobin A1C: 5.2

## 2021-10-09 ENCOUNTER — Encounter: Payer: Self-pay | Admitting: Internal Medicine

## 2021-10-09 ENCOUNTER — Other Ambulatory Visit: Payer: Self-pay

## 2021-10-09 MED ORDER — FLUOXETINE HCL 20 MG PO TABS: 10.0000 mg | ORAL_TABLET | Freq: Two times a day (BID) | ORAL | 1 refills | Status: DC

## 2021-10-17 DIAGNOSIS — M9905 Segmental and somatic dysfunction of pelvic region: Secondary | ICD-10-CM | POA: Diagnosis not present

## 2021-10-17 DIAGNOSIS — M9901 Segmental and somatic dysfunction of cervical region: Secondary | ICD-10-CM | POA: Diagnosis not present

## 2021-10-17 DIAGNOSIS — M9902 Segmental and somatic dysfunction of thoracic region: Secondary | ICD-10-CM | POA: Diagnosis not present

## 2021-10-17 DIAGNOSIS — M9903 Segmental and somatic dysfunction of lumbar region: Secondary | ICD-10-CM | POA: Diagnosis not present

## 2021-10-23 ENCOUNTER — Other Ambulatory Visit: Payer: Self-pay

## 2021-10-23 MED ORDER — BUPROPION HCL ER (XL) 150 MG PO TB24: 150.0000 mg | ORAL_TABLET | Freq: Every day | ORAL | 1 refills | Status: DC

## 2021-10-31 DIAGNOSIS — M85851 Other specified disorders of bone density and structure, right thigh: Secondary | ICD-10-CM | POA: Diagnosis not present

## 2021-10-31 DIAGNOSIS — Z780 Asymptomatic menopausal state: Secondary | ICD-10-CM | POA: Diagnosis not present

## 2021-10-31 DIAGNOSIS — M85852 Other specified disorders of bone density and structure, left thigh: Secondary | ICD-10-CM | POA: Diagnosis not present

## 2021-11-01 DIAGNOSIS — R87610 Atypical squamous cells of undetermined significance on cytologic smear of cervix (ASC-US): Secondary | ICD-10-CM | POA: Diagnosis not present

## 2021-11-01 LAB — HM PAP SMEAR

## 2021-12-25 ENCOUNTER — Encounter: Payer: Self-pay | Admitting: Internal Medicine

## 2021-12-27 ENCOUNTER — Ambulatory Visit (INDEPENDENT_AMBULATORY_CARE_PROVIDER_SITE_OTHER): Payer: Medicare HMO | Admitting: Internal Medicine

## 2021-12-27 ENCOUNTER — Encounter: Payer: Self-pay | Admitting: Internal Medicine

## 2021-12-27 VITALS — BP 126/64 | HR 59 | Temp 98.4°F | Resp 16 | Ht 65.0 in | Wt 225.0 lb

## 2021-12-27 DIAGNOSIS — Z23 Encounter for immunization: Secondary | ICD-10-CM

## 2021-12-27 DIAGNOSIS — Z79899 Other long term (current) drug therapy: Secondary | ICD-10-CM

## 2021-12-27 DIAGNOSIS — F419 Anxiety disorder, unspecified: Secondary | ICD-10-CM

## 2021-12-27 DIAGNOSIS — R69 Illness, unspecified: Secondary | ICD-10-CM | POA: Diagnosis not present

## 2021-12-27 DIAGNOSIS — M9905 Segmental and somatic dysfunction of pelvic region: Secondary | ICD-10-CM | POA: Diagnosis not present

## 2021-12-27 DIAGNOSIS — F32A Depression, unspecified: Secondary | ICD-10-CM

## 2021-12-27 DIAGNOSIS — M9903 Segmental and somatic dysfunction of lumbar region: Secondary | ICD-10-CM | POA: Diagnosis not present

## 2021-12-27 DIAGNOSIS — M9901 Segmental and somatic dysfunction of cervical region: Secondary | ICD-10-CM | POA: Diagnosis not present

## 2021-12-27 DIAGNOSIS — Z0000 Encounter for general adult medical examination without abnormal findings: Secondary | ICD-10-CM | POA: Diagnosis not present

## 2021-12-27 DIAGNOSIS — M9902 Segmental and somatic dysfunction of thoracic region: Secondary | ICD-10-CM | POA: Diagnosis not present

## 2021-12-27 NOTE — Assessment & Plan Note (Signed)
Here for CPX HTN: reports good ambulatory BPs, continue amlodipine, carvedilol, recent labs very good Dyslipidemia: On atorvastatin, recent labs great. Esophageal versus coronary spasm: On amlodipine, Imdur, doing well.  No change for now Anxiety depression: On Xanax, Wellbutrin, fluoxetine.  Her daughter went through a separation from her husband, things are settling down, to let me know if she likes to de-escalate medications. Tramadol: Take it very rarely for MSK issues. RTC 1 year

## 2021-12-27 NOTE — Patient Instructions (Addendum)
Please read the information about healthcare power of attorney  Recommend to proceed with the following vaccines at your pharmacy:  Shingrix (shingles) Covid booster (bivalent)   Check the  blood pressure regularly BP GOAL is between 110/65 and  135/85. If it is consistently higher or lower, let me know    GO TO THE LAB : Provide a urine sample GO TO THE FRONT DESK, PLEASE SCHEDULE YOUR APPOINTMENTS Come back for a physical exam in 1 year    "Living will", "[place] of attorney": Advanced care planning  (If you already have a living will or healthcare power of attorney, please bring the copy to be scanned in your chart.)  Advance care planning is a process that supports adults in  understanding and sharing their preferences regarding future medical care.   The patient's preferences are recorded in documents called Advance Directives.    Advanced directives are completed (and can be modified at any time) while the patient is in full mental capacity.   The documentation should be available at all times to the patient, the family and the healthcare providers.  Bring in a copy to be scanned in your chart is an excellent idea and is recommended   This legal documents direct treatment decision making and/or appoint a surrogate to make the decision if the patient is not capable to do so.    Advance directives can be documented in many types of formats,  documents have names such as:  Lliving will  Durable power of attorney for healthcare (healthcare proxy or healthcare power of attorney)  Combined directives  Physician orders for life-sustaining treatment    More information at:  [email or web address]

## 2021-12-27 NOTE — Progress Notes (Signed)
Subjective:    Patient ID: Jessica Huang, female    DOB: [birth year], 66 y.o.   MRN: [id number]  DOS:  12/27/2021 Type of visit - description: cpx  Here for CPX, feeling great.  No concerns.  Wt Readings from Last 3 Encounters:  12/27/21 225 lb (102.1 kg)  06/25/21 231 lb 8 oz (105 kg)  12/26/20 231 lb (104.8 kg)   Review of Systems   A 14 point review of systems is negative    Past Medical History:  Diagnosis Date   Allergy    Anxiety    takes Xanax daily   Anxiety and depression    takes Wellbutrin and Prozac daily   Back pain    buldging disc   Cataract    early   GERD (gastroesophageal reflux disease) 09-2012   dx by ENT    Headache(784.0) last time at least a yr ago   migraine-occasionally   Osteopenia 2004   Dexa, hip   PMB (postmenopausal bleeding)    Varicose veins     Past Surgical History:  Procedure Laterality Date   APPENDECTOMY     COLONOSCOPY     DILATATION & CURETTAGE/HYSTEROSCOPY WITH MYOSURE N/A 09/18/2018   Procedure: DILATATION & CURETTAGE/HYSTEROSCOPY WITH MYOSURE;  Surgeon: Servando Salina, MD;  Location: [facility];  Service: Gynecology;  Laterality: N/A;   ORIF SHOULDER FRACTURE Right 11/18/2013   Procedure: OPEN REDUCTION INTERNAL FIXATION (ORIF) RIGHT SHOULDER TUBEROSITY FRACTURE;  Surgeon: Marin Shutter, MD;  Location: [facility];  Service: Orthopedics;  Laterality: Right;   TONSILLECTOMY     VARICOSE VEIN SURGERY  2014   laser    Social History   Socioeconomic History   Marital status: Divorced    Spouse name: Not on file   Number of children: 2   Years of education: Not on file   Highest education level: Not on file  Occupational History   Occupation: retired Pharmacist, hospital   Tobacco Use   Smoking status: Never   Smokeless tobacco: Never  Vaping Use   Vaping Use: Never used  Substance and Sexual Activity   Alcohol use: Not Currently    Alcohol/week: 0.0 standard drinks of alcohol    Comment: socially    Drug use:  No   Sexual activity: Not Currently    Birth control/protection: Post-menopausal  Other Topics Concern   Not on file  Social History Narrative   divorce, 2 children, both live at home [employer] and [place](separated)       Social Determinants of Health   Financial Resource Strain: Not on file  Food Insecurity: Not on file  Transportation Needs: Not on file  Physical Activity: Not on file  Stress: Not on file  Social Connections: Not on file  Intimate Partner Violence: Not on file     Current Outpatient Medications  Medication Instructions   ALPRAZolam (XANAX) 0.5 MG tablet TAKE 1/2 TO 1 (ONE-HALF TO ONE) TABLET BY MOUTH THREE TIMES DAILY AS NEEDED FOR ANXIETY OR  SLEEP   amLODipine (NORVASC) 5 mg, Oral, Daily   atorvastatin (LIPITOR) 10 mg, Oral, Daily   buPROPion (WELLBUTRIN XL) 150 mg, Oral, Daily   Calcium Carb-Cholecalciferol (CALCIUM 1000 + D PO) 1,000 mg, Oral, Daily   carvedilol (COREG) 6.25 MG tablet TAKE ONE TABLET BY MOUTH TWICE A DAY WITH A MEAL   FLUoxetine (PROZAC) 10 mg, Oral, 2 times daily   fluticasone (FLONASE) 50 MCG/ACT nasal spray 1-2 sprays, Daily   isosorbide mononitrate (IMDUR)  30 MG 24 hr tablet TAKE ONE TABLET BY MOUTH ONE TIME DAILY   traMADol (ULTRAM) 50 mg, Oral, Daily PRN       Objective:   Physical Exam BP 126/64   Pulse (!) 59   Temp 98.4 F (36.9 C) (Oral)   Resp 16   Ht '5\' 5"'$  (1.651 m)   Wt 225 lb (102.1 kg)   SpO2 96%   BMI 37.44 kg/m  General: Well developed, NAD, BMI noted Neck: No  thyromegaly  HEENT:  Normocephalic . Face symmetric, atraumatic Lungs:  CTA B Normal respiratory effort, no intercostal retractions, no accessory muscle use. Heart: RRR,  no murmur.  Abdomen:  Not distended, soft, non-tender. No rebound or rigidity.   Lower extremities: no pretibial edema bilaterally  Skin: Exposed areas without rash. Not pale. Not jaundice Neurologic:  alert & oriented X3.  Speech normal, gait appropriate for age and  unassisted Strength symmetric and appropriate for age.  Psych: Cognition and judgment appear intact.  Cooperative with normal attention span and concentration.  Behavior appropriate. No anxious or depressed appearing.     Assessment    Assessment HTN Anxiety depression Osteopenia, last dexa 06-2015 MSK: Back pain, hip pain GERD DX by ENT 2014 Menopause age ~ 59 Chest pain: 11-2020 stress test neg; dx -- esophageal/coronary spasm, Rx amlodipine and isosorbide   PLAN Here for CPX HTN: reports good ambulatory BPs, continue amlodipine, carvedilol, recent labs very good Dyslipidemia: On atorvastatin, recent labs great. Esophageal versus coronary spasm: On amlodipine, Imdur, doing well.  No change for now Anxiety depression: On Xanax, Wellbutrin, fluoxetine.  Her daughter went through a separation from her husband, things are settling down, to let me know if she likes to de-escalate medications. Tramadol: Take it very rarely for MSK issues. RTC 1 year

## 2021-12-27 NOTE — Assessment & Plan Note (Addendum)
-  Td 11/2018 - PNM 20: 2022 -  zostavax 2017 - shingrex: Recommended   -   covid vax booster: pro>cons, recommended -   flu shot today --Cscope neg 1-09, cscope 09/2017 next 2029 per GI letter -Female care: sees gynecology   +FH breast ca: MMG 09-2021 Dexa @ gyn, on calcium and vitamin D. -Labs from 09/2021:  A1c 5.2, LFTs normal, LDL 64, total cholesterol 134, calcium 8.8, potassium 4.3, creatinine 1.0, hemoglobin 13.7 -Diet and exercise doing great, + weight loss. praised -POA discussed

## 2021-12-30 LAB — DRUG MONITORING PANEL 375977 , URINE
Alcohol Metabolites: NEGATIVE ng/mL (ref <500)
Alphahydroxyalprazolam: 58 ng/mL — ABNORMAL HIGH (ref <25)
Alphahydroxymidazolam: NEGATIVE ng/mL (ref <50)
Alphahydroxytriazolam: NEGATIVE ng/mL (ref <50)
Aminoclonazepam: NEGATIVE ng/mL (ref <25)
Amphetamines: NEGATIVE ng/mL (ref <500)
Barbiturates: NEGATIVE ng/mL (ref <300)
Benzodiazepines: POSITIVE ng/mL — AB (ref <100)
Cocaine Metabolite: NEGATIVE ng/mL (ref <150)
Desmethyltramadol: NEGATIVE ng/mL (ref <100)
Hydroxyethylflurazepam: NEGATIVE ng/mL (ref <50)
Lorazepam: NEGATIVE ng/mL (ref <50)
Marijuana Metabolite: NEGATIVE ng/mL (ref <20)
Nordiazepam: NEGATIVE ng/mL (ref <50)
Opiates: NEGATIVE ng/mL (ref <100)
Oxazepam: NEGATIVE ng/mL (ref <50)
Oxycodone: NEGATIVE ng/mL (ref <100)
Temazepam: NEGATIVE ng/mL (ref <50)
Tramadol: NEGATIVE ng/mL (ref <100)

## 2021-12-30 LAB — DM TEMPLATE

## 2022-01-10 ENCOUNTER — Ambulatory Visit (INDEPENDENT_AMBULATORY_CARE_PROVIDER_SITE_OTHER): Payer: Medicare HMO | Admitting: *Deleted

## 2022-01-10 DIAGNOSIS — Z0000 Encounter for general adult medical examination without abnormal findings: Secondary | ICD-10-CM | POA: Diagnosis not present

## 2022-01-10 NOTE — Progress Notes (Signed)
Subjective:   Jessica Huang is a 66 y.o. female who presents for Medicare Annual (Subsequent) preventive examination.  I connected with  DEBBORAH Huang on 01/10/22 by a audio enabled telemedicine application and verified that I am speaking with the correct person using two identifiers.  Patient Location: Home  Provider Location: Office/Clinic  I discussed the limitations of evaluation and management by telemedicine. The patient expressed understanding and agreed to proceed.   Review of Systems    Defer to PCP Cardiac Risk Factors include: advanced age (>3mn, >>47women);hypertension;dyslipidemia     Objective:    There were no vitals filed for this visit. There is no height or weight on file to calculate BMI.     01/10/2022    1:50 PM 09/18/2018    8:18 AM 02/18/2018    5:03 PM 11/16/2013    2:13 PM  Advanced Directives  Does Patient Have a Medical Advance Directive? Yes Yes Yes Patient has advance directive, copy not in Huang  Type of Advance Directive [place]ri[place]cliff ManorLiving will H[place]pherdLiving will  [place] will  Does patient want to make changes to medical advance directive? No - Patient declined  No - Patient declined   Copy of [place]in Huang? No - copy requested No - copy requested  Copy requested from family  Pre-existing out of facility DNR order (yellow form or pink MOST form)    No    Current Medications (verified) Outpatient Encounter Medications as of 01/10/2022  Medication Sig   ALPRAZolam (XANAX) 0.5 MG tablet TAKE 1/2 TO 1 (ONE-HALF TO ONE) TABLET BY MOUTH THREE TIMES DAILY AS NEEDED FOR ANXIETY OR  SLEEP   amLODipine (NORVASC) 5 MG tablet Take 1 tablet (5 mg total) by mouth daily.   atorvastatin (LIPITOR) 10 MG tablet Take 1 tablet (10 mg total) by mouth daily.   buPROPion (WELLBUTRIN XL) 150 MG 24 hr tablet Take 1 tablet (150 mg total) by mouth daily.   Calcium  Carb-Cholecalciferol (CALCIUM 1000 + D PO) Take 1,000 mg by mouth daily.   carvedilol (COREG) 6.25 MG tablet TAKE ONE TABLET BY MOUTH TWICE A DAY WITH A MEAL   FLUoxetine (PROZAC) 20 MG tablet Take 0.5 tablets (10 mg total) by mouth 2 (two) times daily.   fluticasone (FLONASE) 50 MCG/ACT nasal spray Place 1-2 sprays into both nostrils daily. (Patient not taking: Reported on 12/27/2021)   isosorbide mononitrate (IMDUR) 30 MG 24 hr tablet TAKE ONE TABLET BY MOUTH ONE TIME DAILY   traMADol (ULTRAM) 50 MG tablet Take 1 tablet (50 mg total) by mouth daily as needed.   No facility-administered encounter medications on file as of 01/10/2022.    Allergies (verified) Penicillins   History: Past Medical History:  Diagnosis Date   Allergy    Anxiety    takes Xanax daily   Anxiety and depression    takes Wellbutrin and Prozac daily   Back pain    buldging disc   Cataract    early   GERD (gastroesophageal reflux disease) 09-2012   dx by ENT    Headache(784.0) last time at least a yr ago   migraine-occasionally   Osteopenia 2004   Dexa, hip   PMB (postmenopausal bleeding)    Varicose veins    Past Surgical History:  Procedure Laterality Date   APPENDECTOMY     COLONOSCOPY     DILATATION & CURETTAGE/HYSTEROSCOPY WITH MYOSURE N/A 09/18/2018  Procedure: [place];  Surgeon: Servando Salina, MD;  [facility];  Service: Gynecology;  Laterality: N/A;   ORIF SHOULDER FRACTURE Right 11/18/2013   Procedure: OPEN REDUCTION INTERNAL FIXATION (ORIF) RIGHT SHOULDER TUBEROSITY FRACTURE;  Surgeon: Marin Shutter, MD;  Location: [facility];  Service: Orthopedics;  Laterality: Right;   TONSILLECTOMY     VARICOSE VEIN SURGERY  2014   laser    Family History  Problem Relation Age of Onset   Other Mother        valve replacement--M   Breast cancer Mother        M late 60s   Heart failure Mother        M   COPD Father        smoker    Diabetes Other        GPs , late onset   Colon polyps Sister    Heart attack Neg Hx    Colon cancer Neg Hx    Esophageal cancer Neg Hx    Stomach cancer Neg Hx    Rectal cancer Neg Hx    Social History   Socioeconomic History   Marital status: Divorced    Spouse name: Not on file   Number of children: 2   Years of education: Not on file   Highest education level: Not on file  Occupational History   Occupation: retired Pharmacist, hospital   Tobacco Use   Smoking status: Never   Smokeless tobacco: Never  Vaping Use   Vaping Use: Never used  Substance and Sexual Activity   Alcohol use: Not Currently    Alcohol/week: 0.0 standard drinks of alcohol    Comment: socially    Drug use: No   Sexual activity: Not Currently    Birth control/protection: Post-menopausal  Other Topics Concern   Not on file  Social History Narrative   divorce, 2 children, both live at home [place] and [place](separated)       Social Determinants of Health   Financial Resource Strain: Low Risk  (01/10/2022)   Overall Financial Resource Strain (CARDIA)    Difficulty of Paying Living Expenses: Not hard at all  Food Insecurity: No Food Insecurity (01/10/2022)   Hunger Vital Sign    Worried About Running Out of Food in the Last Year: Never true    Ran Out of Food in the Last Year: Never true  Transportation Needs: No Transportation Needs (01/10/2022)   PRAPARE - Hydrologist (Medical): No    Lack of Transportation (Non-Medical): No  Physical Activity: Inactive (01/10/2022)   Exercise Vital Sign    Days of Exercise per Week: 0 days    Minutes of Exercise per Session: 0 min  Stress: No Stress Concern Present (01/10/2022)   [place]    Feeling of Stress : Not at all  Social Connections: Moderately Integrated (01/10/2022)   Social Connection and Isolation Panel [NHANES]    Frequency of Communication with Friends and Family:  More than three times a week    Frequency of Social Gatherings with Friends and Family: Twice a week    Attends Religious Services: More than 4 times per year    Active Member of [employer] or Organizations: Yes    Attends Music therapist: More than 4 times per year    Marital Status: Divorced    Tobacco Counseling Counseling given: Not Answered   Clinical Intake:  Pre-visit preparation completed: Yes  Pain : No/denies pain  Diabetes: No  How often do you need to have someone help you when you read instructions, pamphlets, or other written materials from your doctor or pharmacy?: 1 - Never  Diabetic? No   Activities of Daily Living    01/10/2022    1:51 PM  In your present state of health, do you have any difficulty performing the following activities:  Hearing? 0  Vision? 0  Difficulty concentrating or making decisions? 0  Walking or climbing stairs? 0  Dressing or bathing? 0  Doing errands, shopping? 0  Preparing Food and eating ? N  Using the Toilet? N  In the past six months, have you accidently leaked urine? N  Do you have problems with loss of bowel control? N  Managing your Medications? N  Managing your Finances? N  Housekeeping or managing your Housekeeping? N    Patient Care Team: Colon Branch, MD as PCP - General Servando Salina, MD as Consulting Physician (Obstetrics and Gynecology) Lake Bells., MD as Referring Physician (Gastroenterology)  Indicate any recent Medical Services you may have received from other than [facility] providers in the past year (date may be approximate).     Assessment:   This is a routine wellness examination for [place].  Hearing/Vision screen No results found.  Dietary issues and exercise activities discussed: Current Exercise Habits: Home exercise routine, Type of exercise: walking, Time (Minutes): 30, Frequency (Times/Week): 2, Weekly Exercise (Minutes/Week): 60, Intensity: Mild, Exercise limited by: Other - see  comments   Goals Addressed   None    Depression Screen    01/10/2022    1:51 PM 12/27/2021   10:11 AM 06/25/2021   10:25 AM 10/10/2020    2:26 PM 11/19/2019    2:32 PM 05/03/2019   10:32 AM 11/16/2018    8:40 AM  PHQ 2/9 Scores  PHQ - 2 Score 0 0 0 4 0 0 5  PHQ- 9 Score  '1 1 12 '$ 0 0 21    Fall Risk    01/10/2022    1:50 PM 12/27/2021    9:53 AM 06/25/2021    9:54 AM 10/10/2020    2:00 PM 10/31/2015    2:06 PM  Fall Risk   Falls in the past year? 0 0 0 0 No  Number falls in past yr: 0 0 0 0   Injury with Fall? 0 0 0 0   Risk for fall due to : No Fall Risks      Follow up Falls evaluation completed Falls evaluation completed Falls evaluation completed Falls evaluation completed     [place]:  Any stairs in or around the home? Yes  If so, are there any without handrails? Yes  Home free of loose throw rugs in walkways, pet beds, electrical cords, etc? Yes  Adequate lighting in your home to reduce risk of falls? Yes   ASSISTIVE DEVICES UTILIZED TO PREVENT FALLS:  Life alert? No  Use of a cane, walker or w/c? No  Grab bars in the bathroom? No  Shower chair or bench in shower? Yes  Elevated toilet seat or a handicapped toilet? No   TIMED UP AND GO:  Was the test performed? No . Audio visit  Cognitive Function:        01/10/2022    1:55 PM  6CIT Screen  What Year? 0 points  What month? 0 points  What time? 0 points  Count back from 20 0 points  Months in reverse 0 points  Repeat phrase 0 points  Total Score 0 points    Immunizations Immunization History  Administered Date(s) Administered   Fluad Quad(high Dose 65+) 02/09/2021, 12/27/2021   Influenza Inj Mdck Quad With Preservative 01/29/2018   Influenza Split 12/14/2013   Influenza Whole 01/12/2013   Influenza,inj,Quad PF,6+ Mos 12/09/2018   Influenza-Unspecified 12/27/2016   PFIZER(Purple Top)SARS-COV-2 Vaccination 06/14/2019, 07/15/2019   PNEUMOCOCCAL CONJUGATE-20  12/26/2020   Td 11/28/2008   Tdap 11/16/2018   Zoster, Live 11/10/2015    TDAP status: Up to date  Flu Vaccine status: Up to date  Pneumococcal vaccine status: Up to date  Covid-19 vaccine status: Information provided on how to obtain vaccines.   Qualifies for Shingles Vaccine? Yes   Zostavax completed Yes   Shingrix Completed?: No.    Education has been provided regarding the importance of this vaccine. Patient has been advised to call insurance company to determine out of pocket expense if they have not yet received this vaccine. Advised may also receive vaccine at local pharmacy or Health Dept. Verbalized acceptance and understanding.  Screening Tests Health Maintenance  Topic Date Due   Zoster Vaccines- Shingrix (1 of 2) Never done   COVID-19 Vaccine (3 - Pfizer risk series) 08/12/2019   MAMMOGRAM  10/05/2022   COLONOSCOPY (Pts 45-9yr Insurance coverage will need to be confirmed)  10/21/2027   TETANUS/TDAP  11/15/2028   Pneumonia Vaccine 66 Years old  Completed   INFLUENZA VACCINE  Completed   DEXA SCAN  Completed   Hepatitis C Screening  Completed   HPV VACCINES  Aged Out    Health Maintenance  Health Maintenance Due  Topic Date Due   Zoster Vaccines- Shingrix (1 of 2) Never done   COVID-19 Vaccine (3 - Pfizer risk series) 08/12/2019    Colorectal cancer screening: Type of screening: Colonoscopy. Completed 10/20/17. Repeat every 10 years  Mammogram status: Completed 10/04/21. Repeat every year  Bone Density status: Completed 08/18/17. Results reflect: Bone density results: OSTEOPENIA. Repeat every 2 years.  Lung Cancer Screening: (Low Dose CT Chest recommended if Age 66-80years, 30 pack-year currently smoking OR have quit w/in 15years.) does not qualify.   Lung Cancer Screening Referral: N/a  Additional Screening:  Hepatitis C Screening: does qualify; Completed 06/19/15  Vision Screening: Recommended annual ophthalmology exams for early detection of glaucoma  and other disorders of the eye. Is the patient up to date with their annual eye exam?  Yes  Who is the provider or what is the name of the office in which the patient attends annual eye exams? Dr. TBradley FerrisIf pt is not established with a provider, would they like to be referred to a provider to establish care? No .   Dental Screening: Recommended annual dental exams for proper oral hygiene  Community Resource Referral / Chronic Care Management: CRR required this visit?  No   CCM required this visit?  No      Plan:     I have personally reviewed and noted the following in the patient's Huang:   Medical and social history Use of alcohol, tobacco or illicit drugs  Current medications and supplements including opioid prescriptions. Patient is currently taking opioid prescriptions. Information provided to patient regarding non-opioid alternatives. Patient advised to discuss non-opioid treatment plan with their provider. Functional ability and status Nutritional status Physical activity Advanced directives List of other physicians Hospitalizations, surgeries, and ER visits in previous 12 months Vitals  Screenings to include cognitive, depression, and falls Referrals and appointments  In addition, I have reviewed and discussed with patient certain preventive protocols, quality metrics, and best practice recommendations. A written personalized care plan for preventive services as well as general preventive health recommendations were provided to patient.   Due to this being a telephonic visit, the after visit summary with patients personalized plan was offered to patient via mail or my-Huang. Patient would like to access on my-Huang.   Beatris Ship, [place]   01/10/2022   Nurse Notes: None

## 2022-01-10 NOTE — Patient Instructions (Signed)
[name] , Thank you for taking time to come for your Medicare Wellness Visit. I appreciate your ongoing commitment to your health goals. Please review the following plan we discussed and let me know if I can assist you in the future.   These are the goals we discussed:  Goals   None     This is a list of the screening recommended for you and due dates:  Health Maintenance  Topic Date Due   Zoster (Shingles) Vaccine (1 of 2) Never done   COVID-19 Vaccine (3 - Pfizer risk series) 08/12/2019   Mammogram  10/05/2022   Colon Cancer Screening  10/21/2027   Tetanus Vaccine  11/15/2028   Pneumonia Vaccine  Completed   Flu Shot  Completed   DEXA scan (bone density measurement)  Completed   Hepatitis C Screening: USPSTF Recommendation to screen - Ages 63-79 yo.  Completed   HPV Vaccine  Aged Out     Next appointment: Follow up in one year for your annual wellness visit    Preventive Care 65 Years and Older, Female Preventive care refers to lifestyle choices and visits with your health care provider that can promote health and wellness. What does preventive care include? A yearly physical exam. This is also called an annual well check. Dental exams once or twice a year. Routine eye exams. Ask your health care provider how often you should have your eyes checked. Personal lifestyle choices, including: Daily care of your teeth and gums. Regular physical activity. Eating a healthy diet. Avoiding tobacco and drug use. Limiting alcohol use. Practicing safe sex. Taking low-dose aspirin every day. Taking vitamin and mineral supplements as recommended by your health care provider. What happens during an annual well check? The services and screenings done by your health care provider during your annual well check will depend on your age, overall health, lifestyle risk factors, and family history of disease. Counseling  Your health care provider may ask you questions about your: Alcohol  use. Tobacco use. Drug use. Emotional well-being. Home and relationship well-being. Sexual activity. Eating habits. History of falls. Memory and ability to understand (cognition). Work and work Statistician. Reproductive health. Screening  You may have the following tests or measurements: Height, weight, and BMI. Blood pressure. Lipid and cholesterol levels. These may be checked every 5 years, or more frequently if you are over 14 years old. Skin check. Lung cancer screening. You may have this screening every year starting at age 38 if you have a 30-pack-year history of smoking and currently smoke or have quit within the past 15 years. Fecal occult blood test (FOBT) of the stool. You may have this test every year starting at age 17. Flexible sigmoidoscopy or colonoscopy. You may have a sigmoidoscopy every 5 years or a colonoscopy every 10 years starting at age 56. Hepatitis C blood test. Hepatitis B blood test. Sexually transmitted disease (STD) testing. Diabetes screening. This is done by checking your blood sugar (glucose) after you have not eaten for a while (fasting). You may have this done every 1-3 years. Bone density scan. This is done to screen for osteoporosis. You may have this done starting at age 11. Mammogram. This may be done every 1-2 years. Talk to your health care provider about how often you should have regular mammograms. Talk with your health care provider about your test results, treatment options, and if necessary, the need for more tests. Vaccines  Your health care provider may recommend certain vaccines, such as:  Influenza vaccine. This is recommended every year. Tetanus, diphtheria, and acellular pertussis (Tdap, Td) vaccine. You may need a Td booster every 10 years. Zoster vaccine. You may need this after age 76. Pneumococcal 13-valent conjugate (PCV13) vaccine. One dose is recommended after age 58. Pneumococcal polysaccharide (PPSV23) vaccine. One dose is  recommended after age 58. Talk to your health care provider about which screenings and vaccines you need and how often you need them. This information is not intended to replace advice given to you by your health care provider. Make sure you discuss any questions you have with your health care provider. Document Released: 04/28/2015 Document Revised: 12/20/2015 Document Reviewed: 01/31/2015 Elsevier Interactive Patient Education  2017 [place] Prevention in the Home Falls can cause injuries. They can happen to people of all ages. There are many things you can do to make your home safe and to help prevent falls. What can I do on the outside of my home? Regularly fix the edges of walkways and driveways and fix any cracks. Remove anything that might make you trip as you walk through a door, such as a raised step or threshold. Trim any bushes or trees on the path to your home. Use bright outdoor lighting. Clear any walking paths of anything that might make someone trip, such as rocks or tools. Regularly check to see if handrails are loose or broken. Make sure that both sides of any steps have handrails. Any raised decks and porches should have guardrails on the edges. Have any leaves, snow, or ice cleared regularly. Use sand or salt on walking paths during winter. Clean up any spills in your garage right away. This includes oil or grease spills. What can I do in the bathroom? Use night lights. Install grab bars by the toilet and in the tub and shower. Do not use towel bars as grab bars. Use non-skid mats or decals in the tub or shower. If you need to sit down in the shower, use a plastic, non-slip stool. Keep the floor dry. Clean up any water that spills on the floor as soon as it happens. Remove soap buildup in the tub or shower regularly. Attach bath mats securely with double-sided non-slip rug tape. Do not have throw rugs and other things on the floor that can make you  trip. What can I do in the bedroom? Use night lights. Make sure that you have a light by your bed that is easy to reach. Do not use any sheets or blankets that are too big for your bed. They should not hang down onto the floor. Have a firm chair that has side arms. You can use this for support while you get dressed. Do not have throw rugs and other things on the floor that can make you trip. What can I do in the kitchen? Clean up any spills right away. Avoid walking on wet floors. Keep items that you use a lot in easy-to-reach places. If you need to reach something above you, use a strong step stool that has a grab bar. Keep electrical cords out of the way. Do not use floor polish or wax that makes floors slippery. If you must use wax, use non-skid floor wax. Do not have throw rugs and other things on the floor that can make you trip. What can I do with my stairs? Do not leave any items on the stairs. Make sure that there are handrails on both sides of the stairs and use them.  Fix handrails that are broken or loose. Make sure that handrails are as long as the stairways. Check any carpeting to make sure that it is firmly attached to the stairs. Fix any carpet that is loose or worn. Avoid having throw rugs at the top or bottom of the stairs. If you do have throw rugs, attach them to the floor with carpet tape. Make sure that you have a light switch at the top of the stairs and the bottom of the stairs. If you do not have them, ask someone to add them for you. What else can I do to help prevent falls? Wear shoes that: Do not have high heels. Have rubber bottoms. Are comfortable and fit you well. Are closed at the toe. Do not wear sandals. If you use a stepladder: Make sure that it is fully opened. Do not climb a closed stepladder. Make sure that both sides of the stepladder are locked into place. Ask someone to hold it for you, if possible. Clearly mark and make sure that you can  see: Any grab bars or handrails. First and last steps. Where the edge of each step is. Use tools that help you move around (mobility aids) if they are needed. These include: Canes. Walkers. Scooters. Crutches. Turn on the lights when you go into a dark area. Replace any light bulbs as soon as they burn out. Set up your furniture so you have a clear path. Avoid moving your furniture around. If any of your floors are uneven, fix them. If there are any pets around you, be aware of where they are. Review your medicines with your doctor. Some medicines can make you feel dizzy. This can increase your chance of falling. Ask your doctor what other things that you can do to help prevent falls. This information is not intended to replace advice given to you by your health care provider. Make sure you discuss any questions you have with your health care provider. Document Released: 01/26/2009 Document Revised: 09/07/2015 Document Reviewed: 05/06/2014 Elsevier Interactive Patient Education  2017 [employer].

## 2022-01-28 ENCOUNTER — Other Ambulatory Visit: Payer: Self-pay

## 2022-01-28 MED ORDER — BUPROPION HCL ER (XL) 150 MG PO TB24: 150.0000 mg | ORAL_TABLET | Freq: Every day | ORAL | 3 refills | Status: DC

## 2022-02-04 ENCOUNTER — Telehealth: Payer: Self-pay

## 2022-02-04 MED ORDER — ALPRAZOLAM 0.5 MG PO TABS: ORAL_TABLET | ORAL | 1 refills | Status: DC

## 2022-02-04 MED ORDER — TRAMADOL HCL 50 MG PO TABS: 50.0000 mg | ORAL_TABLET | Freq: Every day | ORAL | 0 refills | Status: DC | PRN

## 2022-02-04 NOTE — Telephone Encounter (Signed)
Pdmp okay, Rx sent 

## 2022-02-04 NOTE — Telephone Encounter (Signed)
Requesting:tramadol '50mg'$   Contract: 10/10/20 UDS: 12/27/21 Last Visit: 12/27/21 Next Visit: 12/30/22 Last Refill: 09/11/21 #30 and 0RF   Please Advise

## 2022-02-04 NOTE — Telephone Encounter (Signed)
Requesting:alprazolam 0.'5mg'$   Contract:10/10/20 UDS:12/27/21 Last Visit: 12/27/21 Next Visit:12/30/22 Last Refill:04/25/21 #90 and 1RF  Please Advise

## 2022-02-12 ENCOUNTER — Other Ambulatory Visit: Payer: Self-pay

## 2022-02-12 MED ORDER — AMLODIPINE BESYLATE 5 MG PO TABS: 5.0000 mg | ORAL_TABLET | Freq: Every day | ORAL | 1 refills | Status: DC

## 2022-02-12 MED ORDER — ISOSORBIDE MONONITRATE ER 30 MG PO TB24: 30.0000 mg | ORAL_TABLET | Freq: Every day | ORAL | 1 refills | Status: DC

## 2022-03-04 ENCOUNTER — Other Ambulatory Visit: Payer: Self-pay

## 2022-03-04 DIAGNOSIS — M9903 Segmental and somatic dysfunction of lumbar region: Secondary | ICD-10-CM | POA: Diagnosis not present

## 2022-03-04 DIAGNOSIS — M9905 Segmental and somatic dysfunction of pelvic region: Secondary | ICD-10-CM | POA: Diagnosis not present

## 2022-03-04 DIAGNOSIS — M9902 Segmental and somatic dysfunction of thoracic region: Secondary | ICD-10-CM | POA: Diagnosis not present

## 2022-03-04 DIAGNOSIS — M9901 Segmental and somatic dysfunction of cervical region: Secondary | ICD-10-CM | POA: Diagnosis not present

## 2022-03-04 MED ORDER — CARVEDILOL 6.25 MG PO TABS: ORAL_TABLET | ORAL | 1 refills | Status: DC

## 2022-03-11 ENCOUNTER — Other Ambulatory Visit: Payer: Self-pay

## 2022-03-11 MED ORDER — ATORVASTATIN CALCIUM 10 MG PO TABS: 10.0000 mg | ORAL_TABLET | Freq: Every day | ORAL | 1 refills | Status: DC

## 2022-03-20 DIAGNOSIS — H52203 Unspecified astigmatism, bilateral: Secondary | ICD-10-CM | POA: Diagnosis not present

## 2022-03-20 DIAGNOSIS — H524 Presbyopia: Secondary | ICD-10-CM | POA: Diagnosis not present

## 2022-03-20 DIAGNOSIS — H25013 Cortical age-related cataract, bilateral: Secondary | ICD-10-CM | POA: Diagnosis not present

## 2022-03-20 DIAGNOSIS — H5203 Hypermetropia, bilateral: Secondary | ICD-10-CM | POA: Diagnosis not present

## 2022-03-20 DIAGNOSIS — H25043 Posterior subcapsular polar age-related cataract, bilateral: Secondary | ICD-10-CM | POA: Diagnosis not present

## 2022-03-20 DIAGNOSIS — H43393 Other vitreous opacities, bilateral: Secondary | ICD-10-CM | POA: Diagnosis not present

## 2022-03-20 DIAGNOSIS — H2513 Age-related nuclear cataract, bilateral: Secondary | ICD-10-CM | POA: Diagnosis not present

## 2022-04-04 ENCOUNTER — Other Ambulatory Visit: Payer: Self-pay

## 2022-04-04 MED ORDER — FLUOXETINE HCL 20 MG PO TABS: 10.0000 mg | ORAL_TABLET | Freq: Two times a day (BID) | ORAL | 1 refills | Status: DC

## 2022-04-05 DIAGNOSIS — H52223 Regular astigmatism, bilateral: Secondary | ICD-10-CM | POA: Diagnosis not present

## 2022-04-05 DIAGNOSIS — H524 Presbyopia: Secondary | ICD-10-CM | POA: Diagnosis not present

## 2022-04-05 DIAGNOSIS — H5203 Hypermetropia, bilateral: Secondary | ICD-10-CM | POA: Diagnosis not present

## 2022-04-29 DIAGNOSIS — Z85828 Personal history of other malignant neoplasm of skin: Secondary | ICD-10-CM | POA: Diagnosis not present

## 2022-04-29 DIAGNOSIS — Z129 Encounter for screening for malignant neoplasm, site unspecified: Secondary | ICD-10-CM | POA: Diagnosis not present

## 2022-04-29 DIAGNOSIS — L570 Actinic keratosis: Secondary | ICD-10-CM | POA: Diagnosis not present

## 2022-04-29 DIAGNOSIS — C44619 Basal cell carcinoma of skin of left upper limb, including shoulder: Secondary | ICD-10-CM | POA: Diagnosis not present

## 2022-04-29 DIAGNOSIS — D225 Melanocytic nevi of trunk: Secondary | ICD-10-CM | POA: Diagnosis not present

## 2022-04-29 DIAGNOSIS — C44519 Basal cell carcinoma of skin of other part of trunk: Secondary | ICD-10-CM | POA: Diagnosis not present

## 2022-05-15 DIAGNOSIS — M9905 Segmental and somatic dysfunction of pelvic region: Secondary | ICD-10-CM | POA: Diagnosis not present

## 2022-05-15 DIAGNOSIS — M9901 Segmental and somatic dysfunction of cervical region: Secondary | ICD-10-CM | POA: Diagnosis not present

## 2022-05-15 DIAGNOSIS — M9902 Segmental and somatic dysfunction of thoracic region: Secondary | ICD-10-CM | POA: Diagnosis not present

## 2022-05-15 DIAGNOSIS — M9903 Segmental and somatic dysfunction of lumbar region: Secondary | ICD-10-CM | POA: Diagnosis not present

## 2022-06-07 ENCOUNTER — Other Ambulatory Visit: Payer: Self-pay

## 2022-06-07 MED ORDER — ATORVASTATIN CALCIUM 10 MG PO TABS: 10.0000 mg | ORAL_TABLET | Freq: Every day | ORAL | 1 refills | Status: DC

## 2022-06-10 ENCOUNTER — Ambulatory Visit (INDEPENDENT_AMBULATORY_CARE_PROVIDER_SITE_OTHER): Payer: Medicare HMO | Admitting: Internal Medicine

## 2022-06-10 VITALS — BP 132/70 | HR 70 | Temp 98.2°F | Resp 18 | Ht 65.0 in | Wt 223.1 lb

## 2022-06-10 DIAGNOSIS — J019 Acute sinusitis, unspecified: Secondary | ICD-10-CM | POA: Diagnosis not present

## 2022-06-10 MED ORDER — AZITHROMYCIN 250 MG PO TABS: ORAL_TABLET | ORAL | 0 refills | Status: DC

## 2022-06-10 NOTE — Patient Instructions (Signed)
Continue Flonase  Continue the nasal rinses  Start Astepro over-the-counter nasal spray: 2 sprays on each side of the nose daily.  You can use this for few weeks.  Take Zithromax, the antibiotic  Call if not gradually better  Try to minimize the use of decongestants

## 2022-06-10 NOTE — Progress Notes (Signed)
Subjective:    Patient ID: Jessica Huang, female    DOB: 1955-04-29, 67 y.o.   MRN: [id number]  DOS:  06/10/2022 Type of visit - description: acute  Symptoms started 2 weeks ago: Left sinus pain, congestion.  Blowing some discharge from there. Then developed left ear pain. Has some postnasal dripping.  Denies any fever or chills. No eye or vision problems. Taking OTC decongestants.   Review of Systems See above   Past Medical History:  Diagnosis Date   Allergy    Anxiety    takes Xanax daily   Anxiety and depression    takes Wellbutrin and Prozac daily   Back pain    buldging disc   Cataract    early   GERD (gastroesophageal reflux disease) 09-2012   dx by ENT    Headache(784.0) last time at least a yr ago   migraine-occasionally   Osteopenia 2004   Dexa, hip   PMB (postmenopausal bleeding)    Varicose veins     Past Surgical History:  Procedure Laterality Date   APPENDECTOMY     COLONOSCOPY     DILATATION & CURETTAGE/HYSTEROSCOPY WITH MYOSURE N/A 09/18/2018   Procedure: [place];  Surgeon: Servando Salina, MD;  Location[facility];  Service: Gynecology;  Laterality: N/A;   ORIF SHOULDER FRACTURE Right 11/18/2013   Procedure: OPEN REDUCTION INTERNAL FIXATION (ORIF) RIGHT SHOULDER TUBEROSITY FRACTURE;  Surgeon: Marin Shutter, MD;  Location: [facility];  Service: Orthopedics;  Laterality: Right;   TONSILLECTOMY     VARICOSE VEIN SURGERY  2014   laser     Current Outpatient Medications  Medication Instructions   ALPRAZolam (XANAX) 0.5 MG tablet TAKE 1/2 TO 1 (ONE-HALF TO ONE) TABLET BY MOUTH THREE TIMES DAILY AS NEEDED FOR ANXIETY OR  SLEEP   amLODipine (NORVASC) 5 mg, Oral, Daily   atorvastatin (LIPITOR) 10 mg, Oral, Daily   buPROPion (WELLBUTRIN XL) 150 mg, Oral, Daily   Calcium Carb-Cholecalciferol (CALCIUM 1000 + D PO) 1,000 mg, Oral, Daily   carvedilol (COREG) 6.25 MG tablet TAKE ONE TABLET BY MOUTH  TWICE A DAY WITH A MEAL   FLUoxetine (PROZAC) 10 mg, Oral, 2 times daily   fluticasone (FLONASE) 50 MCG/ACT nasal spray 1-2 sprays, Each Nare, Daily   isosorbide mononitrate (IMDUR) 30 mg, Oral, Daily   Soft Lens Products (SALINE) SOLN Does not apply, 280m <BR>165mmometasone 0.'6mg'$ /1034mR>Tobramycin '30mg'$ /1mL67mn <BR>Rinse- prescribed by ENT   traMADol (ULTRAM) 50 mg, Oral, Daily PRN       Objective:   Physical Exam BP 132/70   Pulse 70   Temp 98.2 F (36.8 C) (Oral)   Resp 18   Ht '5\' 5"'$  (1.651 m)   Wt 223 lb 2 oz (101.2 kg)   SpO2 98%   BMI 37.13 kg/m  General:   Well developed, NAD, BMI noted. HEENT:  Normocephalic . Face symmetric, atraumatic. TMs: Bulged bilaterally, slightly more on the left.  No redness.   Throat: Symmetric, normal rate. Nose: Mildly congested Sinuses: Mildly TTP on the left maxillary area Lungs:  CTA B Normal respiratory effort, no intercostal retractions, no accessory muscle use. Heart: RRR,  no murmur.  Lower extremities: no pretibial edema bilaterally  Skin: Not pale. Not jaundice Neurologic:  alert & oriented X3.  Speech normal, gait appropriate for age and unassisted Psych--  Cognition and judgment appear intact.  Cooperative with normal attention span and concentration.  Behavior appropriate. No anxious or depressed appearing.  Assessment     Assessment HTN Anxiety depression Osteopenia, last dexa 06-2015 MSK: Back pain, hip pain GERD DX by ENT 2014 Menopause age ~ 24 Chest pain: 11-2020 stress test neg; dx -- esophageal/coronary spasm, Rx amlodipine and isosorbide   PLAN Sinusitis: Reports a history of recurrent L sinusitis, has seen ENT before, was Rx nasal rinse which has helped significantly until now.  She is seems to have developed an acute sinusitis. Plan: Continue Flonase, nasal rinses, add Astepro for few weeks, Z-Pak.  Call if not better. Macromastia: Has an appointment pending with surgeon for breast reduction  evaluation.

## 2022-06-10 NOTE — Assessment & Plan Note (Signed)
Sinusitis: Reports a history of recurrent L sinusitis, has seen ENT before, was Rx nasal rinse which has helped significantly until now.  She is seems to have developed an acute sinusitis. Plan: Continue Flonase, nasal rinses, add Astepro for few weeks, Z-Pak.  Call if not better. Macromastia: Has an appointment pending with surgeon for breast reduction evaluation.

## 2022-07-03 DIAGNOSIS — M546 Pain in thoracic spine: Secondary | ICD-10-CM | POA: Diagnosis not present

## 2022-07-03 DIAGNOSIS — M542 Cervicalgia: Secondary | ICD-10-CM | POA: Diagnosis not present

## 2022-07-03 DIAGNOSIS — M25519 Pain in unspecified shoulder: Secondary | ICD-10-CM | POA: Diagnosis not present

## 2022-07-03 DIAGNOSIS — L304 Erythema intertrigo: Secondary | ICD-10-CM | POA: Diagnosis not present

## 2022-07-03 DIAGNOSIS — N62 Hypertrophy of breast: Secondary | ICD-10-CM | POA: Diagnosis not present

## 2022-07-23 ENCOUNTER — Other Ambulatory Visit: Payer: Self-pay

## 2022-07-23 MED ORDER — ISOSORBIDE MONONITRATE ER 30 MG PO TB24: 30.0000 mg | ORAL_TABLET | Freq: Every day | ORAL | 1 refills | Status: DC

## 2022-07-29 DIAGNOSIS — M9905 Segmental and somatic dysfunction of pelvic region: Secondary | ICD-10-CM | POA: Diagnosis not present

## 2022-07-29 DIAGNOSIS — M9903 Segmental and somatic dysfunction of lumbar region: Secondary | ICD-10-CM | POA: Diagnosis not present

## 2022-07-29 DIAGNOSIS — M9902 Segmental and somatic dysfunction of thoracic region: Secondary | ICD-10-CM | POA: Diagnosis not present

## 2022-07-29 DIAGNOSIS — M9901 Segmental and somatic dysfunction of cervical region: Secondary | ICD-10-CM | POA: Diagnosis not present

## 2022-08-07 ENCOUNTER — Other Ambulatory Visit: Payer: Self-pay | Admitting: Internal Medicine

## 2022-08-20 DIAGNOSIS — J0101 Acute recurrent maxillary sinusitis: Secondary | ICD-10-CM | POA: Diagnosis not present

## 2022-08-28 DIAGNOSIS — C44619 Basal cell carcinoma of skin of left upper limb, including shoulder: Secondary | ICD-10-CM | POA: Diagnosis not present

## 2022-08-28 DIAGNOSIS — C44519 Basal cell carcinoma of skin of other part of trunk: Secondary | ICD-10-CM | POA: Diagnosis not present

## 2022-09-05 ENCOUNTER — Other Ambulatory Visit: Payer: Self-pay

## 2022-09-05 MED ORDER — CARVEDILOL 6.25 MG PO TABS: ORAL_TABLET | ORAL | 1 refills | Status: DC

## 2022-09-17 DIAGNOSIS — M25551 Pain in right hip: Secondary | ICD-10-CM | POA: Diagnosis not present

## 2022-09-17 DIAGNOSIS — M5416 Radiculopathy, lumbar region: Secondary | ICD-10-CM | POA: Diagnosis not present

## 2022-09-17 DIAGNOSIS — M7061 Trochanteric bursitis, right hip: Secondary | ICD-10-CM | POA: Diagnosis not present

## 2022-09-20 ENCOUNTER — Telehealth: Payer: Self-pay

## 2022-09-20 MED ORDER — ALPRAZOLAM 0.5 MG PO TABS: 0.2500 mg | ORAL_TABLET | Freq: Three times a day (TID) | ORAL | 1 refills | Status: DC | PRN

## 2022-09-20 NOTE — Telephone Encounter (Signed)
PDMP okay, Rx sent 

## 2022-09-20 NOTE — Telephone Encounter (Signed)
Requesting: alprazolam 0.5mg   Contract: 10/10/20 UDS: 12/27/21 Last Visit: 12/27/21 Next Visit: 12/30/22 Last Refill: 02/04/22 #90 and 1RF   Please Advise

## 2022-10-01 ENCOUNTER — Other Ambulatory Visit: Payer: Self-pay

## 2022-10-01 MED ORDER — FLUOXETINE HCL 20 MG PO TABS: 10.0000 mg | ORAL_TABLET | Freq: Two times a day (BID) | ORAL | 1 refills | Status: DC

## 2022-10-08 DIAGNOSIS — R87610 Atypical squamous cells of undetermined significance on cytologic smear of cervix (ASC-US): Secondary | ICD-10-CM | POA: Diagnosis not present

## 2022-10-08 DIAGNOSIS — Z124 Encounter for screening for malignant neoplasm of cervix: Secondary | ICD-10-CM | POA: Diagnosis not present

## 2022-10-08 DIAGNOSIS — Z130 Encounter for screening for diseases of the blood and blood-forming organs and certain disorders involving the immune mechanism: Secondary | ICD-10-CM | POA: Diagnosis not present

## 2022-10-08 DIAGNOSIS — Z1231 Encounter for screening mammogram for malignant neoplasm of breast: Secondary | ICD-10-CM | POA: Diagnosis not present

## 2022-10-08 DIAGNOSIS — Z131 Encounter for screening for diabetes mellitus: Secondary | ICD-10-CM | POA: Diagnosis not present

## 2022-10-08 DIAGNOSIS — Z1329 Encounter for screening for other suspected endocrine disorder: Secondary | ICD-10-CM | POA: Diagnosis not present

## 2022-10-08 DIAGNOSIS — Z13220 Encounter for screening for lipoid disorders: Secondary | ICD-10-CM | POA: Diagnosis not present

## 2022-10-08 DIAGNOSIS — Z6838 Body mass index (BMI) 38.0-38.9, adult: Secondary | ICD-10-CM | POA: Diagnosis not present

## 2022-10-08 DIAGNOSIS — Z0000 Encounter for general adult medical examination without abnormal findings: Secondary | ICD-10-CM | POA: Diagnosis not present

## 2022-10-08 DIAGNOSIS — Z01411 Encounter for gynecological examination (general) (routine) with abnormal findings: Secondary | ICD-10-CM | POA: Diagnosis not present

## 2022-10-08 DIAGNOSIS — Z01419 Encounter for gynecological examination (general) (routine) without abnormal findings: Secondary | ICD-10-CM | POA: Diagnosis not present

## 2022-10-08 LAB — HEPATIC FUNCTION PANEL
ALT: 8 U/L (ref 7–35)
AST: 14 (ref 13–35)
Alkaline Phosphatase: 115 (ref 25–125)
Bilirubin, Total: 0.5

## 2022-10-08 LAB — BASIC METABOLIC PANEL
BUN: 15 (ref 4–21)
CO2: 23 — AB (ref 13–22)
Chloride: 108 (ref 99–108)
Creatinine: 1.1 (ref 0.5–1.1)
Glucose: 90
Potassium: 5 mEq/L (ref 3.5–5.1)
Sodium: 143 (ref 137–147)

## 2022-10-08 LAB — TSH: TSH: 1.92 (ref 0.41–5.90)

## 2022-10-08 LAB — CBC AND DIFFERENTIAL
HCT: 42 (ref 36–46)
Hemoglobin: 14.3 (ref 12.0–16.0)
Platelets: 213 10*3/uL (ref 150–400)
WBC: 8.7

## 2022-10-08 LAB — LIPID PANEL
A1c: 5.4
Cholesterol: 167 (ref 0–200)
HDL: 58 (ref 35–70)
LDL Cholesterol: 91
Triglycerides: 100 (ref 40–160)

## 2022-10-08 LAB — COMPREHENSIVE METABOLIC PANEL
Albumin: 3.8 (ref 3.5–5.0)
Calcium: 9.1 (ref 8.7–10.7)
Globulin: 2.3

## 2022-10-08 LAB — HM MAMMOGRAPHY

## 2022-10-08 LAB — CBC: RBC: 4.67 (ref 3.87–5.11)

## 2022-10-09 ENCOUNTER — Encounter: Payer: Self-pay | Admitting: Internal Medicine

## 2022-10-14 ENCOUNTER — Telehealth: Payer: Self-pay

## 2022-10-14 DIAGNOSIS — M9903 Segmental and somatic dysfunction of lumbar region: Secondary | ICD-10-CM | POA: Diagnosis not present

## 2022-10-14 DIAGNOSIS — M9905 Segmental and somatic dysfunction of pelvic region: Secondary | ICD-10-CM | POA: Diagnosis not present

## 2022-10-14 DIAGNOSIS — M9901 Segmental and somatic dysfunction of cervical region: Secondary | ICD-10-CM | POA: Diagnosis not present

## 2022-10-14 DIAGNOSIS — M9902 Segmental and somatic dysfunction of thoracic region: Secondary | ICD-10-CM | POA: Diagnosis not present

## 2022-10-14 NOTE — Telephone Encounter (Signed)
Requesting: tramadol 50mg   Contract: 12/27/21 UDS: 12/27/21 Last Visit: 06/10/22 Next Visit: 12/30/22 Last Refill: 02/04/22 #30 and 0RF  Please Advise

## 2022-10-15 MED ORDER — TRAMADOL HCL 50 MG PO TABS: 50.0000 mg | ORAL_TABLET | Freq: Every day | ORAL | 0 refills | Status: DC | PRN

## 2022-10-15 NOTE — Telephone Encounter (Signed)
PDMP okay, Rx sent 

## 2022-10-25 DIAGNOSIS — F419 Anxiety disorder, unspecified: Secondary | ICD-10-CM | POA: Diagnosis not present

## 2022-10-25 DIAGNOSIS — M546 Pain in thoracic spine: Secondary | ICD-10-CM | POA: Diagnosis not present

## 2022-10-25 DIAGNOSIS — I10 Essential (primary) hypertension: Secondary | ICD-10-CM | POA: Diagnosis not present

## 2022-10-25 DIAGNOSIS — F32A Depression, unspecified: Secondary | ICD-10-CM | POA: Diagnosis not present

## 2022-10-25 DIAGNOSIS — N6012 Diffuse cystic mastopathy of left breast: Secondary | ICD-10-CM | POA: Diagnosis not present

## 2022-10-25 DIAGNOSIS — L304 Erythema intertrigo: Secondary | ICD-10-CM | POA: Diagnosis not present

## 2022-10-25 DIAGNOSIS — N6011 Diffuse cystic mastopathy of right breast: Secondary | ICD-10-CM | POA: Diagnosis not present

## 2022-10-25 DIAGNOSIS — E669 Obesity, unspecified: Secondary | ICD-10-CM | POA: Diagnosis not present

## 2022-10-25 DIAGNOSIS — N62 Hypertrophy of breast: Secondary | ICD-10-CM | POA: Diagnosis not present

## 2022-10-25 DIAGNOSIS — E785 Hyperlipidemia, unspecified: Secondary | ICD-10-CM | POA: Diagnosis not present

## 2022-10-25 DIAGNOSIS — M542 Cervicalgia: Secondary | ICD-10-CM | POA: Diagnosis not present

## 2022-10-25 HISTORY — PX: BREAST REDUCTION SURGERY: SHX8

## 2022-11-28 ENCOUNTER — Encounter (INDEPENDENT_AMBULATORY_CARE_PROVIDER_SITE_OTHER): Payer: Self-pay

## 2022-12-05 ENCOUNTER — Other Ambulatory Visit: Payer: Self-pay

## 2022-12-05 MED ORDER — ATORVASTATIN CALCIUM 10 MG PO TABS: 10.0000 mg | ORAL_TABLET | Freq: Every day | ORAL | 1 refills | Status: DC

## 2022-12-30 ENCOUNTER — Encounter: Payer: Self-pay | Admitting: Internal Medicine

## 2022-12-30 ENCOUNTER — Ambulatory Visit (INDEPENDENT_AMBULATORY_CARE_PROVIDER_SITE_OTHER): Payer: Medicare HMO | Admitting: Internal Medicine

## 2022-12-30 VITALS — BP 122/68 | HR 65 | Temp 97.7°F | Resp 16 | Ht 65.0 in | Wt 223.5 lb

## 2022-12-30 DIAGNOSIS — Z79899 Other long term (current) drug therapy: Secondary | ICD-10-CM

## 2022-12-30 DIAGNOSIS — Z0000 Encounter for general adult medical examination without abnormal findings: Secondary | ICD-10-CM

## 2022-12-30 DIAGNOSIS — Z23 Encounter for immunization: Secondary | ICD-10-CM | POA: Diagnosis not present

## 2022-12-30 DIAGNOSIS — F419 Anxiety disorder, unspecified: Secondary | ICD-10-CM | POA: Diagnosis not present

## 2022-12-30 DIAGNOSIS — F32A Depression, unspecified: Secondary | ICD-10-CM

## 2022-12-30 NOTE — Assessment & Plan Note (Signed)
Here for CPX HTN: Ambulatory BPs always WNL.  Continue amlodipine, carvedilol, Imdur. Hyperlipidemia: On atorvastatin, last LDL  91.  10-year cardiovascular risk: 7.6%, controlled. Anxiety depression: Well-controlled, on Xanax, Wellbutrin, fluoxetine.  Check UDS, contract. Osteopenia: Per gynecology. H/o Esophageal vs coronary vasospasm: On amlodipine, Imdur.  No change.  Also taking aspirin. RTC 1 year.

## 2022-12-30 NOTE — Assessment & Plan Note (Signed)
Here for CPX - Td 11/2018 - PNM 20: 2022 -  zostavax 2017 -   flu shot today - Vax advise: shingrix, covid booster, RSV--Cscope neg 1-09, cscope 09/2017 next 2029 per GI letter -Female care: sees gynecology   +FH breast ca: MMG 09-2022 Dexa @ gyn, on calcium and vitamin D. -Labs from 09/2022: reviewed  -Diet and exercise counseling, room for improvement, recommend to increase physical activity gradually. -POA discussed

## 2022-12-30 NOTE — Patient Instructions (Addendum)
Vaccines I recommend: Shingrix (shingles) Covid booster- new this fall RSV vaccine   Check the  blood pressure regularly Blood pressure goal:  between 110/65 and  135/85. If it is consistently higher or lower, let me know    Next visit with me 1 year for your sickle exam.  Come back sooner if needed. Please schedule it at the front desk    "Health Care Power of attorney" ,  "Living will": Consider provided copy to be scanned in your chart.

## 2022-12-30 NOTE — Progress Notes (Signed)
Subjective:    Patient ID: Jessica Huang, female    DOB: [birth year], 66 y.o.   MRN: [id number]  DOS:  12/30/2022 Type of visit - description: cpx  Here for CPX. Doing well and has no concerns  Review of Systems   A 14 point review of systems is negative    Past Medical History:  Diagnosis Date   Allergy    Anxiety    takes Xanax daily   Anxiety and depression    takes Wellbutrin and Prozac daily   Back pain    buldging disc   Cataract    early   GERD (gastroesophageal reflux disease) 09-2012   dx by ENT    Headache(784.0) last time at least a yr ago   migraine-occasionally   Osteopenia 2004   Dexa, hip   PMB (postmenopausal bleeding)    Varicose veins     Past Surgical History:  Procedure Laterality Date   APPENDECTOMY     BREAST REDUCTION SURGERY Bilateral 10/25/2022   DILATATION & CURETTAGE/HYSTEROSCOPY WITH MYOSURE N/A 09/18/2018   Procedure: DILATATION & CURETTAGE/HYSTEROSCOPY WITH MYOSURE;  Surgeon: Maxie Better, MD;  Location: [facility];  Service: Gynecology;  Laterality: N/A;   ORIF SHOULDER FRACTURE Right 11/18/2013   Procedure: OPEN REDUCTION INTERNAL FIXATION (ORIF) RIGHT SHOULDER TUBEROSITY FRACTURE;  Surgeon: Senaida Lange, MD;  Location: [facility];  Service: Orthopedics;  Laterality: Right;   TONSILLECTOMY     VARICOSE VEIN SURGERY  2014   laser    Social History   Socioeconomic History   Marital status: Divorced    Spouse name: Not on file   Number of children: 2   Years of education: Not on file   Highest education level: Not on file  Occupational History   Occupation: retired Runner, broadcasting/film/video   Tobacco Use   Smoking status: Never   Smokeless tobacco: Never  Vaping Use   Vaping status: Never Used  Substance and Sexual Activity   Alcohol use: Not Currently    Alcohol/week: 0.0 standard drinks of alcohol    Comment: socially    Drug use: No   Sexual activity: Not Currently    Birth control/protection: Post-menopausal   Other Topics Concern   Not on file  Social History Narrative   divorce, 2 children, both live at home [place] and [place](separated)       Social Determinants of Health   Financial Resource Strain: Low Risk  (01/10/2022)   Overall Financial Resource Strain (CARDIA)    Difficulty of Paying Living Expenses: Not hard at all  Food Insecurity: No Food Insecurity (01/10/2022)   Hunger Vital Sign    Worried About Running Out of Food in the Last Year: Never true    Ran Out of Food in the Last Year: Never true  Transportation Needs: No Transportation Needs (01/10/2022)   PRAPARE - Administrator, Civil Service (Medical): No    Lack of Transportation (Non-Medical): No  Physical Activity: Inactive (01/10/2022)   Exercise Vital Sign    Days of Exercise per Week: 0 days    Minutes of Exercise per Session: 0 min  Stress: No Stress Concern Present (01/10/2022)   [employer] of Occupational Health - Occupational Stress Questionnaire    Feeling of Stress : Not at all  Social Connections: Unknown (10/25/2022)   Received from [facility]   Social Network    Social Network: Not on file  Intimate Partner Violence: Unknown (10/25/2022)   Received from [facility]   HITS    Physically Hurt: Not on file    Insult or Talk Down To: Not on file    Threaten Physical Harm: Not on file    Scream or Curse: Not on file     Current Outpatient Medications  Medication Instructions   ALPRAZolam (XANAX) 0.25-0.5 mg, Oral, 3 times daily PRN   amLODipine (NORVASC) 5 mg, Oral, Daily   aspirin EC 81 mg, Oral, Daily, Swallow whole.   atorvastatin (LIPITOR) 10 mg, Oral, Daily   buPROPion (WELLBUTRIN XL) 150 mg, Oral, Daily   Calcium Carb-Cholecalciferol (CALCIUM 1000 + D PO) 1,000 mg, Oral, Daily   carvedilol (COREG) 6.25 MG tablet TAKE ONE TABLET BY MOUTH TWICE A DAY WITH A MEAL   FLUoxetine (PROZAC) 10 mg, Oral, 2 times daily   fluticasone (FLONASE) 50 MCG/ACT nasal spray 1-2 sprays, Each  Nare, Daily PRN   isosorbide mononitrate (IMDUR) 30 mg, Oral, Daily   Soft Lens Products (SALINE) SOLN Does not apply, <BR>54mL mometasone 0.6mg /13mL<BR>Tobramycin 30mg /63mL con <BR>Rinse- prescribed by ENT   traMADol (ULTRAM) 50 mg, Oral, Daily PRN       Objective:   Physical Exam BP 122/68   Pulse 65   Temp 97.7 F (36.5 C) (Oral)   Resp 16   Ht 5\' 5"  (1.651 m)   Wt 223 lb 8 oz (101.4 kg)   SpO2 97%   BMI 37.19 kg/m  General: Well developed, NAD, BMI noted Neck: No  thyromegaly  HEENT:  Normocephalic . Face symmetric, atraumatic Lungs:  CTA B Normal respiratory effort, no intercostal retractions, no accessory muscle use. Heart: RRR,  no murmur.  Abdomen:  Not distended, soft, non-tender. No rebound or rigidity.   Lower extremities: no pretibial edema bilaterally  Skin: Exposed areas without rash. Not pale. Not jaundice Neurologic:  alert & oriented X3.  Speech normal, gait appropriate for age and unassisted Strength symmetric and appropriate for age.  Psych: Cognition and judgment appear intact.  Cooperative with normal attention span and concentration.  Behavior appropriate. No anxious or depressed appearing.     Assessment    Assessment HTN High Cholesterol Anxiety depression Osteopenia, last dexa 06-2015, f/u gyn MSK: Back pain, hip pain GERD DX by ENT 2014 Menopause age ~ 4 Chest pain: 11-2020 stress test neg; dx -- esophageal/coronary spasm, Rx amlodipine and isosorbide   PLAN Here for CPX - Td 11/2018 - PNM 20: 2022 -  zostavax 2017 -   flu shot today - Vax advise: shingrix, covid booster, RSV--Cscope neg 1-09, cscope 09/2017 next 2029 per GI letter -Female care: sees gynecology   +FH breast ca: MMG 09-2022 Dexa @ gyn, on calcium and vitamin D. -Labs from 09/2022: reviewed  -Diet and exercise counseling, room for improvement, recommend to increase physical activity gradually. -POA discussed HTN: Ambulatory BPs always WNL.  Continue  amlodipine, carvedilol, Imdur. Hyperlipidemia: On atorvastatin, last LDL  91.  10-year cardiovascular risk: 7.6%, controlled. Anxiety depression: Well-controlled, on Xanax, Wellbutrin, fluoxetine.  Check UDS, contract. Osteopenia: Per gynecology. H/o Esophageal vs coronary vasospasm: On amlodipine, Imdur.  No change.  Also taking aspirin. RTC 1 year.

## 2023-01-02 DIAGNOSIS — M9903 Segmental and somatic dysfunction of lumbar region: Secondary | ICD-10-CM | POA: Diagnosis not present

## 2023-01-02 DIAGNOSIS — M9901 Segmental and somatic dysfunction of cervical region: Secondary | ICD-10-CM | POA: Diagnosis not present

## 2023-01-02 DIAGNOSIS — M9902 Segmental and somatic dysfunction of thoracic region: Secondary | ICD-10-CM | POA: Diagnosis not present

## 2023-01-02 DIAGNOSIS — M9905 Segmental and somatic dysfunction of pelvic region: Secondary | ICD-10-CM | POA: Diagnosis not present

## 2023-01-02 LAB — DRUG TOX MONITOR 1 W/CONF, ORAL FLD

## 2023-01-13 ENCOUNTER — Telehealth: Payer: Self-pay | Admitting: Internal Medicine

## 2023-01-13 NOTE — Telephone Encounter (Signed)
Copied from CRM [id number]. Topic: Medicare AWV >> Jan 13, 2023  1:20 PM Payton Doughty wrote: Reason for CRM: Called LM 01/13/2023 to schedule AWV   Verlee Rossetti; Care Guide Ambulatory Clinical Support [place] [facility] Direct Dial: [phone]

## 2023-01-20 ENCOUNTER — Other Ambulatory Visit: Payer: Self-pay

## 2023-01-20 MED ORDER — BUPROPION HCL ER (XL) 150 MG PO TB24: 150.0000 mg | ORAL_TABLET | Freq: Every day | ORAL | 1 refills | Status: DC

## 2023-02-11 ENCOUNTER — Other Ambulatory Visit: Payer: Self-pay

## 2023-02-11 MED ORDER — AMLODIPINE BESYLATE 5 MG PO TABS: 5.0000 mg | ORAL_TABLET | Freq: Every day | ORAL | 1 refills | Status: DC

## 2023-02-18 ENCOUNTER — Ambulatory Visit: Payer: Medicare HMO | Admitting: *Deleted

## 2023-02-18 DIAGNOSIS — Z0000 Encounter for general adult medical examination without abnormal findings: Secondary | ICD-10-CM

## 2023-02-18 NOTE — Progress Notes (Signed)
Subjective:   Jessica Huang is a 67 y.o. female who presents for Medicare Annual (Subsequent) preventive examination.  Visit Complete: Virtual I connected with  Jessica Huang on 02/18/23 by a audio enabled telemedicine application and verified that I am speaking with the correct person using two identifiers.  Patient Location: Home  Provider Location: Office/Clinic  I discussed the limitations of evaluation and management by telemedicine. The patient expressed understanding and agreed to proceed.  Vital Signs: Because this visit was a virtual/telehealth visit, some criteria may be missing or patient reported. Any vitals not documented were not able to be obtained and vitals that have been documented are patient reported.   Cardiac Risk Factors include: advanced age (>77men, >25 women);dyslipidemia;hypertension     Objective:    There were no vitals filed for this visit. There is no height or weight on file to calculate BMI.     02/18/2023    3:41 PM 01/10/2022    1:50 PM 09/18/2018    8:18 AM 02/18/2018    5:03 PM 11/16/2013    2:13 PM  Advanced Directives  Does Patient Have a Medical Advance Directive? Yes Yes Yes Yes Patient has advance directive, copy not in chart  Type of Advance Directive Healthcare Power of [place];Living will Healthcare Power of [place];Living will Healthcare Power of [place];Living will  Healthcare Power of [place];Living will  Does patient want to make changes to medical advance directive? No - Patient declined No - Patient declined  No - Patient declined   Copy of Healthcare Power of Attorney in Chart? No - copy requested No - copy requested No - copy requested  Copy requested from family  Pre-existing out of facility DNR order (yellow form or pink MOST form)     No    Current Medications (verified) Outpatient Encounter Medications as of 02/18/2023  Medication Sig   ALPRAZolam (XANAX) 0.5 MG tablet Take 0.5-1 tablets (0.25-0.5 mg total) by mouth 3  (three) times daily as needed for anxiety or sleep.   amLODipine (NORVASC) 5 MG tablet Take 1 tablet (5 mg total) by mouth daily.   aspirin EC 81 MG tablet Take 81 mg by mouth daily. Swallow whole.   atorvastatin (LIPITOR) 10 MG tablet Take 1 tablet (10 mg total) by mouth daily.   buPROPion (WELLBUTRIN XL) 150 MG 24 hr tablet Take 1 tablet (150 mg total) by mouth daily.   Calcium Carb-Cholecalciferol (CALCIUM 1000 + D PO) Take 1,000 mg by mouth daily.   carvedilol (COREG) 6.25 MG tablet TAKE ONE TABLET BY MOUTH TWICE A DAY WITH A MEAL   FLUoxetine (PROZAC) 20 MG tablet Take 0.5 tablets (10 mg total) by mouth 2 (two) times daily.   fluticasone (FLONASE) 50 MCG/ACT nasal spray Place 1-2 sprays into both nostrils daily as needed for allergies or rhinitis.   isosorbide mononitrate (IMDUR) 30 MG 24 hr tablet Take 1 tablet (30 mg total) by mouth daily.   Soft Lens Products (SALINE) SOLN by Does not apply route.  10mL mometasone 0.6mg /48mL Tobramycin 30mg /9mL con  Rinse- prescribed by ENT   traMADol (ULTRAM) 50 MG tablet Take 1 tablet (50 mg total) by mouth daily as needed.   No facility-administered encounter medications on file as of 02/18/2023.    Allergies (verified) Penicillins   History: Past Medical History:  Diagnosis Date   Allergy    Anxiety    takes Xanax daily   Anxiety and depression    takes Wellbutrin and Prozac daily  Back pain    buldging disc   Cataract    early   GERD (gastroesophageal reflux disease) 09-2012   dx by ENT    Headache(784.0) last time at least a yr ago   migraine-occasionally   Osteopenia 2004   Dexa, hip   PMB (postmenopausal bleeding)    Varicose veins    Past Surgical History:  Procedure Laterality Date   APPENDECTOMY     BREAST REDUCTION SURGERY Bilateral 10/25/2022   DILATATION & CURETTAGE/HYSTEROSCOPY WITH MYOSURE N/A 09/18/2018   Procedure: DILATATION & CURETTAGE/HYSTEROSCOPY WITH MYOSURE;  Surgeon: Maxie Better, MD;   Location: [facility];  Service: Gynecology;  Laterality: N/A;   ORIF SHOULDER FRACTURE Right 11/18/2013   Procedure: OPEN REDUCTION INTERNAL FIXATION (ORIF) RIGHT SHOULDER TUBEROSITY FRACTURE;  Surgeon: Senaida Lange, MD;  Location: [facility] OR;  Service: Orthopedics;  Laterality: Right;   TONSILLECTOMY     VARICOSE VEIN SURGERY  2014   laser    Family History  Problem Relation Age of Onset   Other Mother        valve replacement--M   Breast cancer Mother        M late 51s   Heart failure Mother        M   COPD Father        smoker   Diabetes Other        GPs , late onset   Colon polyps Sister    Heart attack Neg Hx    Colon cancer Neg Hx    Esophageal cancer Neg Hx    Stomach cancer Neg Hx    Rectal cancer Neg Hx    Social History   Socioeconomic History   Marital status: Divorced    Spouse name: Not on file   Number of children: 2   Years of education: Not on file   Highest education level: Not on file  Occupational History   Occupation: retired Runner, broadcasting/film/video   Tobacco Use   Smoking status: Never   Smokeless tobacco: Never  Vaping Use   Vaping status: Never Used  Substance and Sexual Activity   Alcohol use: Not Currently    Alcohol/week: 0.0 standard drinks of alcohol    Comment: socially    Drug use: No   Sexual activity: Not Currently    Birth control/protection: Post-menopausal  Other Topics Concern   Not on file  Social History Narrative   divorce, 2 children, both live at home [place] and [place](separated)       Social Determinants of Health   Financial Resource Strain: Low Risk  (02/18/2023)   Overall Financial Resource Strain (CARDIA)    Difficulty of Paying Living Expenses: Not very hard  Food Insecurity: No Food Insecurity (02/18/2023)   Hunger Vital Sign    Worried About Running Out of Food in the Last Year: Never true    Ran Out of Food in the Last Year: Never true  Transportation Needs: No Transportation Needs (02/18/2023)   PRAPARE -  Administrator, Civil Service (Medical): No    Lack of Transportation (Non-Medical): No  Physical Activity: Insufficiently Active (02/18/2023)   Exercise Vital Sign    Days of Exercise per Week: 2 days    Minutes of Exercise per Session: 30 min  Stress: No Stress Concern Present (02/18/2023)   [employer] of Occupational Health - Occupational Stress Questionnaire    Feeling of Stress : Only a little  Social Connections: Moderately Integrated (02/18/2023)  Social Advertising account executive [NHANES]    Frequency of Communication with Friends and Family: More than three times a week    Frequency of Social Gatherings with Friends and Family: More than three times a week    Attends Religious Services: More than 4 times per year    Active Member of [employer] or Organizations: Yes    Attends Engineer, structural: More than 4 times per year    Marital Status: Divorced    Tobacco Counseling Counseling given: Not Answered   Clinical Intake:  Pre-visit preparation completed: Yes  Pain : No/denies pain  Nutritional Risks: None Diabetes: No  How often do you need to have someone help you when you read instructions, pamphlets, or other written materials from your doctor or pharmacy?: 1 - Never  Interpreter Needed?: No  Information entered by :: [employer], CMA   Activities of Daily Living    02/18/2023    3:42 PM  In your present state of health, do you have any difficulty performing the following activities:  Hearing? 0  Vision? 0  Difficulty concentrating or making decisions? 0  Walking or climbing stairs? 0  Dressing or bathing? 0  Doing errands, shopping? 0  Preparing Food and eating ? N  Using the Toilet? N  In the past six months, have you accidently leaked urine? N  Do you have problems with loss of bowel control? N  Managing your Medications? N  Managing your Finances? N  Housekeeping or managing your Housekeeping? N    Patient Care  Team: Wanda Plump, MD as PCP - General Maxie Better, MD as Consulting Physician (Obstetrics and Gynecology) Erling Cruz., MD as Referring Physician (Gastroenterology)  Indicate any recent Medical Services you may have received from other than [facility] providers in the past year (date may be approximate).     Assessment:   This is a routine wellness examination for Jessica Huang.  Hearing/Vision screen No results found.   Goals Addressed   None    Depression Screen    02/18/2023    3:51 PM 12/30/2022   10:23 AM 06/10/2022   10:12 AM 01/10/2022    1:51 PM 12/27/2021   10:11 AM 06/25/2021   10:25 AM 10/10/2020    2:26 PM  PHQ 2/9 Scores  PHQ - 2 Score 0 0 0 0 0 0 4  PHQ- 9 Score  0 0  1 1 12     Fall Risk    02/18/2023    3:45 PM 12/30/2022    9:54 AM 06/10/2022   10:12 AM 01/10/2022    1:50 PM 12/27/2021    9:53 AM  Fall Risk   Falls in the past year? 0 0 0 0 0  Number falls in past yr: 0 0 0 0 0  Injury with Fall? 0 0 0 0 0  Risk for fall due to : No Fall Risks   No Fall Risks   Follow up Falls evaluation completed Falls evaluation completed Falls evaluation completed Falls evaluation completed Falls evaluation completed    MEDICARE RISK AT HOME: Medicare Risk at Home Any stairs in or around the home?: Yes If so, are there any without handrails?: No Home free of loose throw rugs in walkways, pet beds, electrical cords, etc?: Yes Adequate lighting in your home to reduce risk of falls?: Yes Life alert?: No Use of a cane, walker or w/c?: No Grab bars in the bathroom?: No Shower chair or bench in shower?: Yes (  built in seat) Elevated toilet seat or a handicapped toilet?: No  TIMED UP AND GO:  Was the test performed?  No    Cognitive Function:        02/18/2023    3:52 PM 01/10/2022    1:55 PM  6CIT Screen  What Year? 0 points 0 points  What month? 0 points 0 points  What time? 0 points 0 points  Count back from 20 0 points 0 points  Months in reverse 0 points 0 points   Repeat phrase 0 points 0 points  Total Score 0 points 0 points    Immunizations Immunization History  Administered Date(s) Administered   Fluad Quad(high Dose 65+) 02/09/2021, 12/27/2021   Fluad Trivalent(High Dose 65+) 12/30/2022   Influenza Inj Mdck Quad With Preservative 01/29/2018   Influenza Split 12/14/2013   Influenza Whole 01/12/2013   Influenza,inj,Quad PF,6+ Mos 12/09/2018   Influenza-Unspecified 12/27/2016   PFIZER(Purple Top)SARS-COV-2 Vaccination 06/14/2019, 07/15/2019   PNEUMOCOCCAL CONJUGATE-20 12/26/2020   Td 11/28/2008   Tdap 08/22/2011, 11/16/2018   Tetanus 04/15/1994, 02/27/2005   Zoster, Live 11/10/2015    TDAP status: Up to date  Flu Vaccine status: Up to date  Pneumococcal vaccine status: Up to date  Covid-19 vaccine status: Information provided on how to obtain vaccines.   Qualifies for Shingles Vaccine? Yes   Zostavax completed Yes   Shingrix Completed?: No.    Education has been provided regarding the importance of this vaccine. Patient has been advised to call insurance company to determine out of pocket expense if they have not yet received this vaccine. Advised may also receive vaccine at local pharmacy or Health Dept. Verbalized acceptance and understanding.  Screening Tests Health Maintenance  Topic Date Due   Zoster Vaccines- Shingrix (1 of 2) 11/03/1974   COVID-19 Vaccine (3 - Pfizer risk series) 08/12/2019   Medicare Annual Wellness (AWV)  01/11/2023   MAMMOGRAM  10/07/2024   Colonoscopy  10/21/2027   DTaP/Tdap/Td (4 - Td or Tdap) 11/15/2028   Pneumonia Vaccine 78+ Years old  Completed   INFLUENZA VACCINE  Completed   DEXA SCAN  Completed   Hepatitis C Screening  Completed   HPV VACCINES  Aged Out    Health Maintenance  Health Maintenance Due  Topic Date Due   Zoster Vaccines- Shingrix (1 of 2) 11/03/1974   COVID-19 Vaccine (3 - Pfizer risk series) 08/12/2019   Medicare Annual Wellness (AWV)  01/11/2023    Colorectal cancer  screening: Type of screening: Colonoscopy. Completed 10/20/17. Repeat every 10 years  Mammogram status: Completed 10/08/22. Repeat every year  Bone Density status: Completed 08/18/17. Results reflect: Bone density results: OSTEOPENIA. Repeat every 2 years.GYN will order  Lung Cancer Screening: (Low Dose CT Chest recommended if Age 47-80 years, 20 pack-year currently smoking OR have quit w/in 15years.) does not qualify.   Additional Screening:  Hepatitis C Screening: does qualify; Completed 06/19/15  Vision Screening: Recommended annual ophthalmology exams for early detection of glaucoma and other disorders of the eye. Is the patient up to date with their annual eye exam?  Yes  Who is the provider or what is the name of the office in which the patient attends annual eye exams? Dr. Meribeth Mattes If pt is not established with a provider, would they like to be referred to a provider to establish care? No .   Dental Screening: Recommended annual dental exams for proper oral hygiene  Diabetic Foot Exam: N/a  Community Resource Referral / Chronic Care Management: CRR  required this visit?  No   CCM required this visit?  No     Plan:     I have personally reviewed and noted the following in the patient's chart:   Medical and social history Use of alcohol, tobacco or illicit drugs  Current medications and supplements including opioid prescriptions. Patient is not currently taking opioid prescriptions. Functional ability and status Nutritional status Physical activity Advanced directives List of other physicians Hospitalizations, surgeries, and ER visits in previous 12 months Vitals Screenings to include cognitive, depression, and falls Referrals and appointments  In addition, I have reviewed and discussed with patient certain preventive protocols, quality metrics, and best practice recommendations. A written personalized care plan for preventive services as well as general preventive health  recommendations were provided to patient.     Donne Anon, CMA   02/18/2023   After Visit Summary: (MyChart) Due to this being a telephonic visit, the after visit summary with patients personalized plan was offered to patient via MyChart   Nurse Notes: None

## 2023-02-18 NOTE — Patient Instructions (Signed)
Jessica Huang , Thank you for taking time to come for your Medicare Wellness Visit. I appreciate your ongoing commitment to your health goals. Please review the following plan we discussed and let me know if I can assist you in the future.   These are the goals we discussed:  Goals   None     This is a list of the screening recommended for you and due dates:  Health Maintenance  Topic Date Due   Zoster (Shingles) Vaccine (1 of 2) 11/03/1974   COVID-19 Vaccine (3 - Pfizer risk series) 08/12/2019   Medicare Annual Wellness Visit  02/18/2024   Mammogram  10/07/2024   Colon Cancer Screening  10/21/2027   DTaP/Tdap/Td vaccine (4 - Td or Tdap) 11/15/2028   Pneumonia Vaccine  Completed   Flu Shot  Completed   DEXA scan (bone density measurement)  Completed   Hepatitis C Screening  Completed   HPV Vaccine  Aged Out     Next appointment: Follow up in one year for your annual wellness visit.   Preventive Care 67 Years and Older, Female Preventive care refers to lifestyle choices and visits with your health care provider that can promote health and wellness. What does preventive care include? A yearly physical exam. This is also called an annual well check. Dental exams once or twice a year. Routine eye exams. Ask your health care provider how often you should have your eyes checked. Personal lifestyle choices, including: Daily care of your teeth and gums. Regular physical activity. Eating a healthy diet. Avoiding tobacco and drug use. Limiting alcohol use. Practicing safe sex. Taking low-dose aspirin every day. Taking vitamin and mineral supplements as recommended by your health care provider. What happens during an annual well check? The services and screenings done by your health care provider during your annual well check will depend on your age, overall health, lifestyle risk factors, and family history of disease. Counseling  Your health care provider may ask you questions  about your: Alcohol use. Tobacco use. Drug use. Emotional well-being. Home and relationship well-being. Sexual activity. Eating habits. History of falls. Memory and ability to understand (cognition). Work and work Astronomer. Reproductive health. Screening  You may have the following tests or measurements: Height, weight, and BMI. Blood pressure. Lipid and cholesterol levels. These may be checked every 5 years, or more frequently if you are over 98 years old. Skin check. Lung cancer screening. You may have this screening every year starting at age 20 if you have a 30-pack-year history of smoking and currently smoke or have quit within the past 15 years. Fecal occult blood test (FOBT) of the stool. You may have this test every year starting at age 69. Flexible sigmoidoscopy or colonoscopy. You may have a sigmoidoscopy every 5 years or a colonoscopy every 10 years starting at age 71. Hepatitis C blood test. Hepatitis B blood test. Sexually transmitted disease (STD) testing. Diabetes screening. This is done by checking your blood sugar (glucose) after you have not eaten for a while (fasting). You may have this done every 1-3 years. Bone density scan. This is done to screen for osteoporosis. You may have this done starting at age 20. Mammogram. This may be done every 1-2 years. Talk to your health care provider about how often you should have regular mammograms. Talk with your health care provider about your test results, treatment options, and if necessary, the need for more tests. Vaccines  Your health care provider may recommend certain vaccines,  such as: Influenza vaccine. This is recommended every year. Tetanus, diphtheria, and acellular pertussis (Tdap, Td) vaccine. You may need a Td booster every 10 years. Zoster vaccine. You may need this after age 35. Pneumococcal 13-valent conjugate (PCV13) vaccine. One dose is recommended after age 79. Pneumococcal polysaccharide (PPSV23)  vaccine. One dose is recommended after age 22. Talk to your health care provider about which screenings and vaccines you need and how often you need them. This information is not intended to replace advice given to you by your health care provider. Make sure you discuss any questions you have with your health care provider. Document Released: 04/28/2015 Document Revised: 12/20/2015 Document Reviewed: 01/31/2015 Elsevier Interactive Patient Education  2017 [employer].  Fall Prevention in the Home Falls can cause injuries. They can happen to people of all ages. There are many things you can do to make your home safe and to help prevent falls. What can I do on the outside of my home? Regularly fix the edges of walkways and driveways and fix any cracks. Remove anything that might make you trip as you walk through a door, such as a raised step or threshold. Trim any bushes or trees on the path to your home. Use bright outdoor lighting. Clear any walking paths of anything that might make someone trip, such as rocks or tools. Regularly check to see if handrails are loose or broken. Make sure that both sides of any steps have handrails. Any raised decks and porches should have guardrails on the edges. Have any leaves, snow, or ice cleared regularly. Use sand or salt on walking paths during winter. Clean up any spills in your garage right away. This includes oil or grease spills. What can I do in the bathroom? Use night lights. Install grab bars by the toilet and in the tub and shower. Do not use towel bars as grab bars. Use non-skid mats or decals in the tub or shower. If you need to sit down in the shower, use a plastic, non-slip stool. Keep the floor dry. Clean up any water that spills on the floor as soon as it happens. Remove soap buildup in the tub or shower regularly. Attach bath mats securely with double-sided non-slip rug tape. Do not have throw rugs and other things on the floor that  can make you trip. What can I do in the bedroom? Use night lights. Make sure that you have a light by your bed that is easy to reach. Do not use any sheets or blankets that are too big for your bed. They should not hang down onto the floor. Have a firm chair that has side arms. You can use this for support while you get dressed. Do not have throw rugs and other things on the floor that can make you trip. What can I do in the kitchen? Clean up any spills right away. Avoid walking on wet floors. Keep items that you use a lot in easy-to-reach places. If you need to reach something above you, use a strong step stool that has a grab bar. Keep electrical cords out of the way. Do not use floor polish or wax that makes floors slippery. If you must use wax, use non-skid floor wax. Do not have throw rugs and other things on the floor that can make you trip. What can I do with my stairs? Do not leave any items on the stairs. Make sure that there are handrails on both sides of the stairs and  use them. Fix handrails that are broken or loose. Make sure that handrails are as long as the stairways. Check any carpeting to make sure that it is firmly attached to the stairs. Fix any carpet that is loose or worn. Avoid having throw rugs at the top or bottom of the stairs. If you do have throw rugs, attach them to the floor with carpet tape. Make sure that you have a light switch at the top of the stairs and the bottom of the stairs. If you do not have them, ask someone to add them for you. What else can I do to help prevent falls? Wear shoes that: Do not have high heels. Have rubber bottoms. Are comfortable and fit you well. Are closed at the toe. Do not wear sandals. If you use a stepladder: Make sure that it is fully opened. Do not climb a closed stepladder. Make sure that both sides of the stepladder are locked into place. Ask someone to hold it for you, if possible. Clearly mark and make sure that you  can see: Any grab bars or handrails. First and last steps. Where the edge of each step is. Use tools that help you move around (mobility aids) if they are needed. These include: Canes. Walkers. Scooters. Crutches. Turn on the lights when you go into a dark area. Replace any light bulbs as soon as they burn out. Set up your furniture so you have a clear path. Avoid moving your furniture around. If any of your floors are uneven, fix them. If there are any pets around you, be aware of where they are. Review your medicines with your doctor. Some medicines can make you feel dizzy. This can increase your chance of falling. Ask your doctor what other things that you can do to help prevent falls. This information is not intended to replace advice given to you by your health care provider. Make sure you discuss any questions you have with your health care provider. Document Released: 01/26/2009 Document Revised: 09/07/2015 Document Reviewed: 05/06/2014 Elsevier Interactive Patient Education  2017 [employer].

## 2023-03-04 ENCOUNTER — Other Ambulatory Visit: Payer: Self-pay

## 2023-03-04 DIAGNOSIS — M9902 Segmental and somatic dysfunction of thoracic region: Secondary | ICD-10-CM | POA: Diagnosis not present

## 2023-03-04 DIAGNOSIS — M9905 Segmental and somatic dysfunction of pelvic region: Secondary | ICD-10-CM | POA: Diagnosis not present

## 2023-03-04 DIAGNOSIS — M9903 Segmental and somatic dysfunction of lumbar region: Secondary | ICD-10-CM | POA: Diagnosis not present

## 2023-03-04 DIAGNOSIS — M9901 Segmental and somatic dysfunction of cervical region: Secondary | ICD-10-CM | POA: Diagnosis not present

## 2023-03-04 MED ORDER — CARVEDILOL 6.25 MG PO TABS: 6.2500 mg | ORAL_TABLET | Freq: Two times a day (BID) | ORAL | 2 refills | Status: DC

## 2023-03-10 DIAGNOSIS — N62 Hypertrophy of breast: Secondary | ICD-10-CM | POA: Diagnosis not present

## 2023-03-20 DIAGNOSIS — R69 Illness, unspecified: Secondary | ICD-10-CM | POA: Diagnosis not present

## 2023-03-31 ENCOUNTER — Other Ambulatory Visit: Payer: Self-pay

## 2023-03-31 MED ORDER — FLUOXETINE HCL 20 MG PO TABS: 10.0000 mg | ORAL_TABLET | Freq: Two times a day (BID) | ORAL | 1 refills | Status: DC

## 2023-04-01 ENCOUNTER — Ambulatory Visit (INDEPENDENT_AMBULATORY_CARE_PROVIDER_SITE_OTHER): Payer: Medicare HMO | Admitting: Internal Medicine

## 2023-04-01 ENCOUNTER — Encounter: Payer: Self-pay | Admitting: Internal Medicine

## 2023-04-01 VITALS — BP 126/82 | HR 68 | Temp 98.2°F | Resp 16 | Ht 65.0 in | Wt 227.2 lb

## 2023-04-01 DIAGNOSIS — J069 Acute upper respiratory infection, unspecified: Secondary | ICD-10-CM | POA: Diagnosis not present

## 2023-04-01 DIAGNOSIS — H1033 Unspecified acute conjunctivitis, bilateral: Secondary | ICD-10-CM

## 2023-04-01 MED ORDER — GENTAMICIN SULFATE 0.3 % OP SOLN: 2.0000 [drp] | Freq: Three times a day (TID) | OPHTHALMIC | 0 refills | Status: DC

## 2023-04-01 NOTE — Patient Instructions (Signed)
Use the eyedrops x 5 days  You can also use artificial tears to refresh your eyes  Stop Pataday  For your respiratory symptoms: Flonase 2 sprays on each side of the nose daily Claritin over-the-counter 10 mg daily Call if not gradually better

## 2023-04-01 NOTE — Progress Notes (Signed)
Subjective:    Patient ID: Jessica Huang, female    DOB: 1955/11/24, 67 y.o.   MRN: [id number]  DOS:  04/01/2023 Type of visit - description: Acute visit  Symptoms started 2 weeks ago which right eye irritation, redness and  white discharge. Has similar but much milder symptoms on the left. Started to take Pataday eyedrops. At this point is much improved but likes to be checked.  Also developed sinus congestion and  some ear pressure   2 days ago. No fever or chills.  No cough.  Some sneezing and runny nose with clear discharge noted.   Review of Systems See above   Past Medical History:  Diagnosis Date   Allergy    Anxiety    takes Xanax daily   Anxiety and depression    takes Wellbutrin and Prozac daily   Back pain    buldging disc   Cataract    early   GERD (gastroesophageal reflux disease) 09-2012   dx by ENT    Headache(784.0) last time at least a yr ago   migraine-occasionally   Osteopenia 2004   Dexa, hip   PMB (postmenopausal bleeding)    Varicose veins     Past Surgical History:  Procedure Laterality Date   APPENDECTOMY     BREAST REDUCTION SURGERY Bilateral 10/25/2022   DILATATION & CURETTAGE/HYSTEROSCOPY WITH MYOSURE N/A 09/18/2018   Procedure: DILATATION & CURETTAGE/HYSTEROSCOPY WITH MYOSURE;  Surgeon: Maxie Better, MD;  Location: [facility];  Service: Gynecology;  Laterality: N/A;   ORIF SHOULDER FRACTURE Right 11/18/2013   Procedure: OPEN REDUCTION INTERNAL FIXATION (ORIF) RIGHT SHOULDER TUBEROSITY FRACTURE;  Surgeon: Senaida Lange, MD;  Location: [facility];  Service: Orthopedics;  Laterality: Right;   TONSILLECTOMY     VARICOSE VEIN SURGERY  2014   laser     Current Outpatient Medications  Medication Instructions   ALPRAZolam (XANAX) 0.25-0.5 mg, Oral, 3 times daily PRN   amLODipine (NORVASC) 5 mg, Oral, Daily   aspirin EC 81 mg, Daily   atorvastatin (LIPITOR) 10 mg, Oral, Daily   buPROPion (WELLBUTRIN XL) 150 mg, Oral,  Daily   Calcium Carb-Cholecalciferol (CALCIUM 1000 + D PO) 1,000 mg, Daily   carvedilol (COREG) 6.25 mg, Oral, 2 times daily with meals   FLUoxetine (PROZAC) 10 mg, Oral, 2 times daily   fluticasone (FLONASE) 50 MCG/ACT nasal spray 1-2 sprays, Daily PRN   isosorbide mononitrate (IMDUR) 30 mg, Oral, Daily   Olopatadine HCl (PATADAY) 0.2 % SOLN Apply to eye.   Soft Lens Products (SALINE) SOLN by Does not apply route.  10mL mometasone 0.6mg /26mL Tobramycin 30mg /77mL con  Rinse- prescribed by ENT   traMADol (ULTRAM) 50 mg, Oral, Daily PRN       Objective:   Physical Exam BP 126/82   Pulse 68   Temp 98.2 F (36.8 C) (Oral)   Resp 16   Ht 5\' 5"  (1.651 m)   Wt 227 lb 4 oz (103.1 kg)   SpO2 97%   BMI 37.82 kg/m  General:   Well developed, NAD, BMI noted. HEENT:  Normocephalic . Face symmetric, atraumatic Throat: Symmetric, no red. Nose: Congested Eyes: Conjunctiva slightly red, no discharge noted, anterior chambers normal, pupils equal and reactive. Lungs:  CTA B Normal respiratory effort, no intercostal retractions, no accessory muscle use. Heart: RRR,  no murmur.  Lower extremities: no pretibial edema bilaterally  Skin: Not pale. Not jaundice Neurologic:  alert & oriented X3.  Speech normal, gait appropriate  for age and unassisted Psych--  Cognition and judgment appear intact.  Cooperative with normal attention span and concentration.  Behavior appropriate. No anxious or depressed appearing.      Assessment     Assessment HTN High Cholesterol Anxiety depression Osteopenia, last dexa 06-2015, f/u gyn MSK: Back pain, hip pain GERD DX by ENT 2014 Menopause age ~ 72 Chest pain: 11-2020 stress test neg; dx -- esophageal/coronary spasm, Rx amlodipine and isosorbide   PLAN Resolving conjunctivitis R>>L: Stop Pataday, gentamicin for few days mostly as a precaution, call  if symptoms resurface. States has a routine f/u w/ opht soon. URI: Respiratory symptoms for a  couple of days, URI versus allergies, recommend Flonase, Claritin, call if not gradually better

## 2023-04-01 NOTE — Assessment & Plan Note (Signed)
Resolving conjunctivitis R>>L: Stop Pataday, gentamicin for few days mostly as a precaution, call  if symptoms resurface. States has a routine f/u w/ opht soon. URI: Respiratory symptoms for a couple of days, URI versus allergies, recommend Flonase, Claritin, call if not gradually better

## 2023-04-03 DIAGNOSIS — H52203 Unspecified astigmatism, bilateral: Secondary | ICD-10-CM | POA: Diagnosis not present

## 2023-04-03 DIAGNOSIS — H25013 Cortical age-related cataract, bilateral: Secondary | ICD-10-CM | POA: Diagnosis not present

## 2023-04-03 DIAGNOSIS — H2513 Age-related nuclear cataract, bilateral: Secondary | ICD-10-CM | POA: Diagnosis not present

## 2023-04-03 DIAGNOSIS — H25043 Posterior subcapsular polar age-related cataract, bilateral: Secondary | ICD-10-CM | POA: Diagnosis not present

## 2023-04-03 DIAGNOSIS — Z0100 Encounter for examination of eyes and vision without abnormal findings: Secondary | ICD-10-CM | POA: Diagnosis not present

## 2023-04-03 DIAGNOSIS — H524 Presbyopia: Secondary | ICD-10-CM | POA: Diagnosis not present

## 2023-04-03 DIAGNOSIS — H5203 Hypermetropia, bilateral: Secondary | ICD-10-CM | POA: Diagnosis not present

## 2023-04-03 DIAGNOSIS — H43393 Other vitreous opacities, bilateral: Secondary | ICD-10-CM | POA: Diagnosis not present

## 2023-04-07 ENCOUNTER — Telehealth: Payer: Self-pay

## 2023-04-07 MED ORDER — AZITHROMYCIN 250 MG PO TABS: ORAL_TABLET | ORAL | 0 refills | Status: DC

## 2023-04-07 NOTE — Telephone Encounter (Signed)
Rx sent 

## 2023-04-07 NOTE — Telephone Encounter (Signed)
Copied from CRM [id number]. Topic: Clinical - Medication Question >> Apr 07, 2023 11:17 AM Denese Killings wrote: Reason for CRM: Patient stated that she has not improved from medications prescribed by doctor. Patient wants antibiotic called in to [facility].

## 2023-04-07 NOTE — Telephone Encounter (Signed)
Spoke w/ Pt- informed Rx has been sent.

## 2023-04-07 NOTE — Telephone Encounter (Signed)
Please send a Z-Pak 

## 2023-04-17 ENCOUNTER — Other Ambulatory Visit (HOSPITAL_BASED_OUTPATIENT_CLINIC_OR_DEPARTMENT_OTHER): Payer: Self-pay

## 2023-04-17 ENCOUNTER — Telehealth: Payer: Self-pay

## 2023-04-17 NOTE — Telephone Encounter (Signed)
 Copied from CRM [id number]. Topic: Clinical - Medical Advice >> Apr 17, 2023  2:06 PM [name] wrote: Reason for CRM: Patient finished her azithromycin  and it relieved symptoms while on Rx but now the symptoms are back and she feels they are at the same level (they are not worse, but they are not better). She is wanting to know if Dr. Amon can call in the Rx again or if she needs a steroid. If an Rx is called in, please sent to [facility].

## 2023-04-18 ENCOUNTER — Other Ambulatory Visit (HOSPITAL_BASED_OUTPATIENT_CLINIC_OR_DEPARTMENT_OTHER): Payer: Self-pay

## 2023-04-18 MED ORDER — PREDNISONE 10 MG PO TABS: ORAL_TABLET | ORAL | 0 refills | Status: DC

## 2023-04-18 MED ORDER — CARVEDILOL 6.25 MG PO TABS: 6.2500 mg | ORAL_TABLET | Freq: Two times a day (BID) | ORAL | 1 refills | Status: DC

## 2023-04-18 MED ORDER — FLUOXETINE HCL 20 MG PO TABS: 10.0000 mg | ORAL_TABLET | Freq: Two times a day (BID) | ORAL | 0 refills | Status: DC

## 2023-04-18 MED ORDER — PREDNISONE 10 MG PO TABS
20.0000 mg | ORAL_TABLET | Freq: Every day | ORAL | 0 refills | Status: DC
  Filled 2023-04-18: qty 10, 5d supply, fill #0

## 2023-04-18 MED ORDER — BUPROPION HCL ER (XL) 150 MG PO TB24
150.0000 mg | ORAL_TABLET | Freq: Every day | ORAL | 0 refills | Status: DC
  Filled 2023-07-21: qty 90, 90d supply, fill #0

## 2023-04-18 MED ORDER — AMLODIPINE BESYLATE 5 MG PO TABS
5.0000 mg | ORAL_TABLET | Freq: Every day | ORAL | 0 refills | Status: DC
  Filled 2023-05-14: qty 90, 90d supply, fill #0

## 2023-04-18 NOTE — Telephone Encounter (Signed)
 Advise patient:  I sent a round of prednisone .  Take over-the-counter antihistaminics such as Claritin  10 mg daily OR  Allegra 180 mg daily.  Continue Robitussin DM or similar medication.  Office visit if not gradually improving in the next 10 to 14 days

## 2023-04-18 NOTE — Telephone Encounter (Signed)
 Spoke w/ Pt- informed of PCP recommendations. PCP sent prednisone to [facility], she requests I call and cancel, then resend it downstairs.

## 2023-04-18 NOTE — Telephone Encounter (Signed)
 Spoke w/ [facility]- cancelled prescription for prednisone and resent downstairs

## 2023-05-02 DIAGNOSIS — Z1330 Encounter for screening examination for mental health and behavioral disorders, unspecified: Secondary | ICD-10-CM | POA: Diagnosis not present

## 2023-05-02 DIAGNOSIS — Z9189 Other specified personal risk factors, not elsewhere classified: Secondary | ICD-10-CM | POA: Diagnosis not present

## 2023-05-02 DIAGNOSIS — Z1239 Encounter for other screening for malignant neoplasm of breast: Secondary | ICD-10-CM | POA: Diagnosis not present

## 2023-05-02 DIAGNOSIS — Z803 Family history of malignant neoplasm of breast: Secondary | ICD-10-CM | POA: Diagnosis not present

## 2023-05-02 DIAGNOSIS — N6091 Unspecified benign mammary dysplasia of right breast: Secondary | ICD-10-CM | POA: Diagnosis not present

## 2023-05-07 DIAGNOSIS — D2271 Melanocytic nevi of right lower limb, including hip: Secondary | ICD-10-CM | POA: Diagnosis not present

## 2023-05-07 DIAGNOSIS — D2272 Melanocytic nevi of left lower limb, including hip: Secondary | ICD-10-CM | POA: Diagnosis not present

## 2023-05-07 DIAGNOSIS — Z85828 Personal history of other malignant neoplasm of skin: Secondary | ICD-10-CM | POA: Diagnosis not present

## 2023-05-07 DIAGNOSIS — L570 Actinic keratosis: Secondary | ICD-10-CM | POA: Diagnosis not present

## 2023-05-07 DIAGNOSIS — D485 Neoplasm of uncertain behavior of skin: Secondary | ICD-10-CM | POA: Diagnosis not present

## 2023-05-07 DIAGNOSIS — D225 Melanocytic nevi of trunk: Secondary | ICD-10-CM | POA: Diagnosis not present

## 2023-05-14 ENCOUNTER — Other Ambulatory Visit (HOSPITAL_BASED_OUTPATIENT_CLINIC_OR_DEPARTMENT_OTHER): Payer: Self-pay

## 2023-05-14 ENCOUNTER — Telehealth: Payer: Self-pay | Admitting: Internal Medicine

## 2023-05-14 NOTE — Telephone Encounter (Signed)
Copied from CRM [id number]. Topic: Clinical - Medication Refill >> May 14, 2023 10:28 AM Claudine Mouton wrote: Most Recent Primary Care Visit:  Provider: Willow Ora E  Department: [facility]  Visit Type: OFFICE VISIT  Date: 04/01/2023  Medication: atorvastatin (LIPITOR) 10 MG tablet  ALPRAZolam (XANAX) 0.5 MG tablet   Has the patient contacted their pharmacy? Yes (Agent: If no, request that the patient contact the pharmacy for the refill. If patient does not wish to contact the pharmacy document the reason why and proceed with request.) (Agent: If yes, when and what did the pharmacy advise?)  Is this the correct pharmacy for this prescription? Yes If no, delete pharmacy and type the correct one.  This is the patient's preferred pharmacy:   [facility] [place] [facility] [address], [address] [place] [address] Phone: [phone] Fax: [phone]   Has the prescription been filled recently? Yes  Is the patient out of the medication? Yes  Has the patient been seen for an appointment in the last year OR does the patient have an upcoming appointment? Yes  Can we respond through MyChart? Yes  Agent: Please be advised that Rx refills may take up to 3 business days. We ask that you follow-up with your pharmacy.

## 2023-05-15 DIAGNOSIS — R600 Localized edema: Secondary | ICD-10-CM | POA: Diagnosis not present

## 2023-05-15 DIAGNOSIS — I872 Venous insufficiency (chronic) (peripheral): Secondary | ICD-10-CM | POA: Diagnosis not present

## 2023-05-15 DIAGNOSIS — I83893 Varicose veins of bilateral lower extremities with other complications: Secondary | ICD-10-CM | POA: Diagnosis not present

## 2023-05-15 DIAGNOSIS — I890 Lymphedema, not elsewhere classified: Secondary | ICD-10-CM | POA: Diagnosis not present

## 2023-05-16 ENCOUNTER — Other Ambulatory Visit (HOSPITAL_BASED_OUTPATIENT_CLINIC_OR_DEPARTMENT_OTHER): Payer: Self-pay

## 2023-05-16 DIAGNOSIS — M79671 Pain in right foot: Secondary | ICD-10-CM | POA: Diagnosis not present

## 2023-05-16 DIAGNOSIS — S86811A Strain of other muscle(s) and tendon(s) at lower leg level, right leg, initial encounter: Secondary | ICD-10-CM | POA: Diagnosis not present

## 2023-05-16 MED ORDER — METHYLPREDNISOLONE 4 MG PO TBPK
ORAL_TABLET | ORAL | 0 refills | Status: DC
  Filled 2023-05-16: qty 21, 6d supply, fill #0

## 2023-05-19 ENCOUNTER — Other Ambulatory Visit (HOSPITAL_BASED_OUTPATIENT_CLINIC_OR_DEPARTMENT_OTHER): Payer: Self-pay

## 2023-05-19 ENCOUNTER — Telehealth: Payer: Self-pay | Admitting: Internal Medicine

## 2023-05-19 MED ORDER — ATORVASTATIN CALCIUM 10 MG PO TABS
10.0000 mg | ORAL_TABLET | Freq: Every day | ORAL | 1 refills | Status: DC
  Filled 2023-05-19: qty 90, 90d supply, fill #0
  Filled 2023-09-09: qty 90, 90d supply, fill #1

## 2023-05-19 MED ORDER — ALPRAZOLAM 0.5 MG PO TABS
0.2500 mg | ORAL_TABLET | Freq: Three times a day (TID) | ORAL | 1 refills | Status: DC | PRN
  Filled 2023-05-19: qty 90, 30d supply, fill #0
  Filled 2023-10-15: qty 90, 30d supply, fill #1

## 2023-05-19 NOTE — Telephone Encounter (Signed)
Copied from CRM [id number]. Topic: Clinical - Medication Refill >> May 19, 2023 10:25 AM [name] wrote: Most Recent Primary Care Visit:  Provider: Willow Ora E  Department: [facility]  Visit Type: OFFICE VISIT  Date: 04/01/2023  Medication: atorvastatin (LIPITOR) 10 MG tablet ALPRAZolam (XANAX) 0.5 MG tablet   Has the patient contacted their pharmacy? Yes (Agent: If no, request that the patient contact the pharmacy for the refill. If patient does not wish to contact the pharmacy document the reason why and proceed with request.) (Agent: If yes, when and what did the pharmacy advise?)  Is this the correct pharmacy for this prescription? Yes If no, delete pharmacy and type the correct one.  This is the patient's preferred pharmacy:  [facility] [place] [facility] [address], [address] [place] [address] Phone: [phone] Fax: [phone]   Has the prescription been filled recently? Yes  Is the patient out of the medication? No  Has the patient been seen for an appointment in the last year OR does the patient have an upcoming appointment? Yes  Can we respond through MyChart? No  Agent: Please be advised that Rx refills may take up to 3 business days. We ask that you follow-up with your pharmacy.

## 2023-05-19 NOTE — Telephone Encounter (Signed)
 PDMP okay, Rx sent

## 2023-05-19 NOTE — Telephone Encounter (Signed)
Requesting: alprazolam 0.5mg   Contract: 12/30/22 UDS: 12/30/22 Last Visit: 04/01/23 Next Visit: 01/02/24 Last Refill: 09/20/22 #90 and 1RF   Please Advise

## 2023-05-22 ENCOUNTER — Other Ambulatory Visit (HOSPITAL_BASED_OUTPATIENT_CLINIC_OR_DEPARTMENT_OTHER): Payer: Self-pay

## 2023-05-22 DIAGNOSIS — H9202 Otalgia, left ear: Secondary | ICD-10-CM | POA: Diagnosis not present

## 2023-05-22 DIAGNOSIS — J0101 Acute recurrent maxillary sinusitis: Secondary | ICD-10-CM | POA: Diagnosis not present

## 2023-05-22 DIAGNOSIS — H6122 Impacted cerumen, left ear: Secondary | ICD-10-CM | POA: Diagnosis not present

## 2023-05-22 MED ORDER — DOXYCYCLINE HYCLATE 100 MG PO CAPS
100.0000 mg | ORAL_CAPSULE | Freq: Two times a day (BID) | ORAL | 0 refills | Status: DC
  Filled 2023-05-22: qty 20, 10d supply, fill #0

## 2023-06-10 DIAGNOSIS — R922 Inconclusive mammogram: Secondary | ICD-10-CM | POA: Diagnosis not present

## 2023-06-10 DIAGNOSIS — Z1239 Encounter for other screening for malignant neoplasm of breast: Secondary | ICD-10-CM | POA: Diagnosis not present

## 2023-06-10 DIAGNOSIS — N6091 Unspecified benign mammary dysplasia of right breast: Secondary | ICD-10-CM | POA: Diagnosis not present

## 2023-06-10 DIAGNOSIS — R92323 Mammographic fibroglandular density, bilateral breasts: Secondary | ICD-10-CM | POA: Diagnosis not present

## 2023-06-10 DIAGNOSIS — Z803 Family history of malignant neoplasm of breast: Secondary | ICD-10-CM | POA: Diagnosis not present

## 2023-06-10 DIAGNOSIS — Z9189 Other specified personal risk factors, not elsewhere classified: Secondary | ICD-10-CM | POA: Diagnosis not present

## 2023-06-10 DIAGNOSIS — Z9889 Other specified postprocedural states: Secondary | ICD-10-CM | POA: Diagnosis not present

## 2023-06-10 LAB — HM MAMMOGRAPHY

## 2023-06-13 ENCOUNTER — Other Ambulatory Visit (HOSPITAL_BASED_OUTPATIENT_CLINIC_OR_DEPARTMENT_OTHER): Payer: Self-pay

## 2023-06-13 MED ORDER — CYCLOBENZAPRINE HCL 10 MG PO TABS
10.0000 mg | ORAL_TABLET | Freq: Every evening | ORAL | 0 refills | Status: DC | PRN
  Filled 2023-06-13: qty 30, 30d supply, fill #0

## 2023-06-19 DIAGNOSIS — M79671 Pain in right foot: Secondary | ICD-10-CM | POA: Diagnosis not present

## 2023-06-19 DIAGNOSIS — R29898 Other symptoms and signs involving the musculoskeletal system: Secondary | ICD-10-CM | POA: Diagnosis not present

## 2023-06-19 DIAGNOSIS — M25674 Stiffness of right foot, not elsewhere classified: Secondary | ICD-10-CM | POA: Diagnosis not present

## 2023-06-25 DIAGNOSIS — R29898 Other symptoms and signs involving the musculoskeletal system: Secondary | ICD-10-CM | POA: Diagnosis not present

## 2023-06-25 DIAGNOSIS — M79671 Pain in right foot: Secondary | ICD-10-CM | POA: Diagnosis not present

## 2023-06-25 DIAGNOSIS — M25674 Stiffness of right foot, not elsewhere classified: Secondary | ICD-10-CM | POA: Diagnosis not present

## 2023-06-27 DIAGNOSIS — R29898 Other symptoms and signs involving the musculoskeletal system: Secondary | ICD-10-CM | POA: Diagnosis not present

## 2023-06-27 DIAGNOSIS — M79671 Pain in right foot: Secondary | ICD-10-CM | POA: Diagnosis not present

## 2023-06-27 DIAGNOSIS — M25674 Stiffness of right foot, not elsewhere classified: Secondary | ICD-10-CM | POA: Diagnosis not present

## 2023-07-01 ENCOUNTER — Other Ambulatory Visit (HOSPITAL_BASED_OUTPATIENT_CLINIC_OR_DEPARTMENT_OTHER): Payer: Self-pay

## 2023-07-01 DIAGNOSIS — M79671 Pain in right foot: Secondary | ICD-10-CM | POA: Diagnosis not present

## 2023-07-01 DIAGNOSIS — S86111A Strain of other muscle(s) and tendon(s) of posterior muscle group at lower leg level, right leg, initial encounter: Secondary | ICD-10-CM | POA: Diagnosis not present

## 2023-07-01 MED ORDER — MELOXICAM 7.5 MG PO TABS
ORAL_TABLET | ORAL | 0 refills | Status: DC
  Filled 2023-07-01: qty 60, 28d supply, fill #0

## 2023-07-01 MED ORDER — DICLOFENAC SODIUM 1 % EX GEL
2.0000 g | Freq: Four times a day (QID) | CUTANEOUS | 2 refills | Status: AC
  Filled 2023-07-01: qty 200, 24d supply, fill #0

## 2023-07-08 DIAGNOSIS — M79671 Pain in right foot: Secondary | ICD-10-CM | POA: Diagnosis not present

## 2023-07-08 DIAGNOSIS — R29898 Other symptoms and signs involving the musculoskeletal system: Secondary | ICD-10-CM | POA: Diagnosis not present

## 2023-07-08 DIAGNOSIS — M25674 Stiffness of right foot, not elsewhere classified: Secondary | ICD-10-CM | POA: Diagnosis not present

## 2023-07-20 DIAGNOSIS — M25571 Pain in right ankle and joints of right foot: Secondary | ICD-10-CM | POA: Diagnosis not present

## 2023-07-21 ENCOUNTER — Other Ambulatory Visit (HOSPITAL_BASED_OUTPATIENT_CLINIC_OR_DEPARTMENT_OTHER): Payer: Self-pay

## 2023-07-25 DIAGNOSIS — S86111A Strain of other muscle(s) and tendon(s) of posterior muscle group at lower leg level, right leg, initial encounter: Secondary | ICD-10-CM | POA: Diagnosis not present

## 2023-07-29 ENCOUNTER — Telehealth: Payer: Self-pay | Admitting: Internal Medicine

## 2023-07-29 ENCOUNTER — Other Ambulatory Visit (HOSPITAL_BASED_OUTPATIENT_CLINIC_OR_DEPARTMENT_OTHER): Payer: Self-pay

## 2023-07-29 MED ORDER — TRAMADOL HCL 50 MG PO TABS
50.0000 mg | ORAL_TABLET | Freq: Every day | ORAL | 0 refills | Status: DC | PRN
  Filled 2023-07-29: qty 30, 30d supply, fill #0

## 2023-07-29 NOTE — Telephone Encounter (Signed)
 Requesting: tramadol 50mg   Contract: 12/30/22 UDS: 12/30/22 Last Visit: 04/01/23 Next Visit: 01/02/24 Last Refill: 10/15/22 #30 and 0RF   Please Advise

## 2023-07-29 NOTE — Telephone Encounter (Signed)
 PDMP okay, Rx sent

## 2023-07-29 NOTE — Telephone Encounter (Signed)
 Copied from CRM [id number]. Topic: Clinical - Medication Refill >> Jul 29, 2023 10:01 AM Artemio Larry wrote: Most Recent Primary Care Visit:  Provider: Devonna Foley E  Department: [facility]  Visit Type: OFFICE VISIT  Date: 04/01/2023  Medication: traMADol (ULTRAM) 50 MG tablet  Has the patient contacted their pharmacy? Yes (Agent: If no, request that the patient contact the pharmacy for the refill. If patient does not wish to contact the pharmacy document the reason why and proceed with request.) (Agent: If yes, when and what did the pharmacy advise?)  Is this the correct pharmacy for this prescription? Yes If no, delete pharmacy and type the correct one.  This is the patient's preferred pharmacy:  [facility] [place] [facility] [address], [address] [place] [address] Phone: [phone] Fax: [phone]   Has the prescription been filled recently? Yes  Is the patient out of the medication? No  Has the patient been seen for an appointment in the last year OR does the patient have an upcoming appointment? Yes  Can we respond through MyChart? Yes  Agent: Please be advised that Rx refills may take up to 3 business days. We ask that you follow-up with your pharmacy.

## 2023-08-10 ENCOUNTER — Other Ambulatory Visit: Payer: Self-pay | Admitting: Internal Medicine

## 2023-08-11 ENCOUNTER — Other Ambulatory Visit (HOSPITAL_BASED_OUTPATIENT_CLINIC_OR_DEPARTMENT_OTHER): Payer: Self-pay

## 2023-08-11 ENCOUNTER — Other Ambulatory Visit (HOSPITAL_BASED_OUTPATIENT_CLINIC_OR_DEPARTMENT_OTHER): Payer: Self-pay | Admitting: Orthopaedic Surgery

## 2023-08-11 MED ORDER — MELOXICAM 7.5 MG PO TABS
ORAL_TABLET | ORAL | 0 refills | Status: AC
  Filled 2023-08-11: qty 60, 30d supply, fill #0

## 2023-08-11 MED ORDER — AMLODIPINE BESYLATE 5 MG PO TABS
5.0000 mg | ORAL_TABLET | Freq: Every day | ORAL | 1 refills | Status: DC
  Filled 2023-08-11: qty 90, 90d supply, fill #0
  Filled 2023-11-12: qty 90, 90d supply, fill #1

## 2023-08-25 DIAGNOSIS — M79671 Pain in right foot: Secondary | ICD-10-CM | POA: Diagnosis not present

## 2023-08-31 ENCOUNTER — Telehealth: Payer: Self-pay | Admitting: Family

## 2023-08-31 DIAGNOSIS — J019 Acute sinusitis, unspecified: Secondary | ICD-10-CM | POA: Diagnosis not present

## 2023-08-31 MED ORDER — DOXYCYCLINE HYCLATE 100 MG PO TABS
100.0000 mg | ORAL_TABLET | Freq: Two times a day (BID) | ORAL | 0 refills | Status: DC
  Filled 2023-08-31: qty 20, 10d supply, fill #0

## 2023-08-31 NOTE — Progress Notes (Signed)
E-Visit for Sinus Problems  We are sorry that you are not feeling well.  Here is how we plan to help!  Based on what you have shared with me it looks like you have sinusitis.  Sinusitis is inflammation and infection in the sinus cavities of the head.  Based on your presentation I believe you most likely have Acute Bacterial Sinusitis.  This is an infection caused by bacteria and is treated with antibiotics. I have prescribed Doxycycline 100mg by mouth twice a day for 10 days. You may use an oral decongestant such as Mucinex D or if you have glaucoma or high blood pressure use plain Mucinex. Saline nasal spray help and can safely be used as often as needed for congestion.  If you develop worsening sinus pain, fever or notice severe headache and vision changes, or if symptoms are not better after completion of antibiotic, please schedule an appointment with a health care provider.    Sinus infections are not as easily transmitted as other respiratory infection, however we still recommend that you avoid close contact with loved ones, especially the very young and elderly.  Remember to wash your hands thoroughly throughout the day as this is the number one way to prevent the spread of infection!  Home Care: Only take medications as instructed by your medical team. Complete the entire course of an antibiotic. Do not take these medications with alcohol. A steam or ultrasonic humidifier can help congestion.  You can place a towel over your head and breathe in the steam from hot water coming from a faucet. Avoid close contacts especially the very young and the elderly. Cover your mouth when you cough or sneeze. Always remember to wash your hands.  Get Help Right Away If: You develop worsening fever or sinus pain. You develop a severe head ache or visual changes. Your symptoms persist after you have completed your treatment plan.  Make sure you Understand these instructions. Will watch your  condition. Will get help right away if you are not doing well or get worse.  Thank you for choosing an e-visit.  Your e-visit answers were reviewed by a board certified advanced clinical practitioner to complete your personal care plan. Depending upon the condition, your plan could have included both over the counter or prescription medications.  Please review your pharmacy choice. Make sure the pharmacy is open so you can pick up prescription now. If there is a problem, you may contact your provider through MyChart messaging and have the prescription routed to another pharmacy.  Your safety is important to us. If you have drug allergies check your prescription carefully.   For the next 24 hours you can use MyChart to ask questions about today's visit, request a non-urgent call back, or ask for a work or school excuse. You will get an email in the next two days asking about your experience. I hope that your e-visit has been valuable and will speed your recovery.   Approximately 5 minutes was spent documenting and reviewing patient's chart.   

## 2023-09-01 ENCOUNTER — Other Ambulatory Visit (HOSPITAL_BASED_OUTPATIENT_CLINIC_OR_DEPARTMENT_OTHER): Payer: Self-pay

## 2023-09-23 ENCOUNTER — Other Ambulatory Visit (HOSPITAL_BASED_OUTPATIENT_CLINIC_OR_DEPARTMENT_OTHER): Payer: Self-pay

## 2023-09-23 MED ORDER — AMOXICILLIN-POT CLAVULANATE 875-125 MG PO TABS
1.0000 | ORAL_TABLET | Freq: Two times a day (BID) | ORAL | 0 refills | Status: DC
  Filled 2023-09-23: qty 20, 10d supply, fill #0

## 2023-09-25 ENCOUNTER — Other Ambulatory Visit (HOSPITAL_BASED_OUTPATIENT_CLINIC_OR_DEPARTMENT_OTHER): Payer: Self-pay

## 2023-09-25 DIAGNOSIS — M76821 Posterior tibial tendinitis, right leg: Secondary | ICD-10-CM | POA: Diagnosis not present

## 2023-09-25 DIAGNOSIS — M19071 Primary osteoarthritis, right ankle and foot: Secondary | ICD-10-CM | POA: Diagnosis not present

## 2023-09-25 MED ORDER — MELOXICAM 7.5 MG PO TABS
7.5000 mg | ORAL_TABLET | Freq: Every day | ORAL | 1 refills | Status: DC
  Filled 2023-09-25: qty 30, 30d supply, fill #0
  Filled 2023-11-04: qty 30, 30d supply, fill #1

## 2023-10-15 ENCOUNTER — Other Ambulatory Visit (HOSPITAL_BASED_OUTPATIENT_CLINIC_OR_DEPARTMENT_OTHER): Payer: Self-pay

## 2023-10-15 ENCOUNTER — Other Ambulatory Visit: Payer: Self-pay | Admitting: Internal Medicine

## 2023-10-15 ENCOUNTER — Other Ambulatory Visit: Payer: Self-pay

## 2023-10-15 MED ORDER — BUPROPION HCL ER (XL) 150 MG PO TB24
150.0000 mg | ORAL_TABLET | Freq: Every day | ORAL | 1 refills | Status: DC
  Filled 2023-10-15: qty 90, 90d supply, fill #0
  Filled 2024-01-15: qty 90, 90d supply, fill #1

## 2023-11-10 DIAGNOSIS — Z13220 Encounter for screening for lipoid disorders: Secondary | ICD-10-CM | POA: Diagnosis not present

## 2023-11-10 DIAGNOSIS — Z1329 Encounter for screening for other suspected endocrine disorder: Secondary | ICD-10-CM | POA: Diagnosis not present

## 2023-11-10 DIAGNOSIS — Z01419 Encounter for gynecological examination (general) (routine) without abnormal findings: Secondary | ICD-10-CM | POA: Diagnosis not present

## 2023-11-10 DIAGNOSIS — Z1331 Encounter for screening for depression: Secondary | ICD-10-CM | POA: Diagnosis not present

## 2023-11-10 DIAGNOSIS — Z0000 Encounter for general adult medical examination without abnormal findings: Secondary | ICD-10-CM | POA: Diagnosis not present

## 2023-11-10 DIAGNOSIS — Z131 Encounter for screening for diabetes mellitus: Secondary | ICD-10-CM | POA: Diagnosis not present

## 2023-11-10 LAB — LIPID PANEL
Cholesterol: 155 (ref 0–200)
HDL: 62 (ref 35–70)
LDL Cholesterol: 78
Triglycerides: 81 (ref 40–160)

## 2023-11-10 LAB — BASIC METABOLIC PANEL WITH GFR
BUN: 14 (ref 4–21)
CO2: 19 (ref 13–22)
Chloride: 106 (ref 99–108)
Creatinine: 1.1 (ref 0.5–1.1)
Glucose: 93
Potassium: 4.6 meq/L (ref 3.5–5.1)
Sodium: 142 (ref 137–147)

## 2023-11-10 LAB — COMPREHENSIVE METABOLIC PANEL WITH GFR
Albumin: 4 (ref 3.5–5.0)
Calcium: 9.2 (ref 8.7–10.7)
Globulin: 2.6
eGFR: 58

## 2023-11-10 LAB — CBC AND DIFFERENTIAL
HCT: 44 (ref 36–46)
Hemoglobin: 14.4 (ref 12.0–16.0)
Platelets: 281 K/uL (ref 150–400)
WBC: 9.3

## 2023-11-10 LAB — HEPATIC FUNCTION PANEL
ALT: 9 U/L (ref 7–35)
AST: 20 (ref 13–35)
Alkaline Phosphatase: 151 — AB (ref 25–125)
Bilirubin, Total: 0.4

## 2023-11-10 LAB — TSH: TSH: 2.14 (ref 0.41–5.90)

## 2023-11-10 LAB — CBC: RBC: 4.83 (ref 3.87–5.11)

## 2023-11-11 LAB — HEMOGLOBIN A1C: Hemoglobin A1C: 5.1

## 2023-11-12 ENCOUNTER — Other Ambulatory Visit: Payer: Self-pay

## 2023-11-29 ENCOUNTER — Telehealth: Admitting: Physician Assistant

## 2023-11-29 DIAGNOSIS — B9689 Other specified bacterial agents as the cause of diseases classified elsewhere: Secondary | ICD-10-CM | POA: Diagnosis not present

## 2023-11-29 DIAGNOSIS — J019 Acute sinusitis, unspecified: Secondary | ICD-10-CM

## 2023-11-29 MED ORDER — DOXYCYCLINE HYCLATE 100 MG PO TABS
100.0000 mg | ORAL_TABLET | Freq: Two times a day (BID) | ORAL | 0 refills | Status: DC
  Filled 2023-11-29: qty 20, 10d supply, fill #0

## 2023-11-29 NOTE — Progress Notes (Signed)
 I have spent 5 minutes in review of e-visit questionnaire, review and updating patient chart, medical decision making and response to patient.   Elsie Velma Lunger, PA-C

## 2023-11-29 NOTE — Progress Notes (Signed)

## 2023-12-01 ENCOUNTER — Other Ambulatory Visit (HOSPITAL_BASED_OUTPATIENT_CLINIC_OR_DEPARTMENT_OTHER): Payer: Self-pay

## 2023-12-01 DIAGNOSIS — Z853 Personal history of malignant neoplasm of breast: Secondary | ICD-10-CM | POA: Diagnosis not present

## 2023-12-01 DIAGNOSIS — R928 Other abnormal and inconclusive findings on diagnostic imaging of breast: Secondary | ICD-10-CM | POA: Diagnosis not present

## 2023-12-01 DIAGNOSIS — Z1239 Encounter for other screening for malignant neoplasm of breast: Secondary | ICD-10-CM | POA: Diagnosis not present

## 2023-12-05 DIAGNOSIS — M19071 Primary osteoarthritis, right ankle and foot: Secondary | ICD-10-CM | POA: Diagnosis not present

## 2023-12-16 DIAGNOSIS — M85851 Other specified disorders of bone density and structure, right thigh: Secondary | ICD-10-CM | POA: Diagnosis not present

## 2023-12-16 DIAGNOSIS — M85852 Other specified disorders of bone density and structure, left thigh: Secondary | ICD-10-CM | POA: Diagnosis not present

## 2023-12-16 DIAGNOSIS — Z13820 Encounter for screening for osteoporosis: Secondary | ICD-10-CM | POA: Diagnosis not present

## 2023-12-16 LAB — HM DEXA SCAN

## 2023-12-19 ENCOUNTER — Other Ambulatory Visit: Payer: Self-pay | Admitting: Internal Medicine

## 2023-12-19 ENCOUNTER — Other Ambulatory Visit (HOSPITAL_BASED_OUTPATIENT_CLINIC_OR_DEPARTMENT_OTHER): Payer: Self-pay

## 2023-12-19 MED ORDER — ATORVASTATIN CALCIUM 10 MG PO TABS
10.0000 mg | ORAL_TABLET | Freq: Every day | ORAL | 1 refills | Status: AC
  Filled 2023-12-19: qty 90, 90d supply, fill #0
  Filled 2024-03-24: qty 90, 90d supply, fill #1

## 2024-01-01 ENCOUNTER — Encounter: Payer: Self-pay | Admitting: Internal Medicine

## 2024-01-02 ENCOUNTER — Encounter: Payer: Self-pay | Admitting: Internal Medicine

## 2024-01-02 ENCOUNTER — Ambulatory Visit (INDEPENDENT_AMBULATORY_CARE_PROVIDER_SITE_OTHER): Payer: Medicare HMO | Admitting: Internal Medicine

## 2024-01-02 VITALS — BP 116/74 | HR 76 | Temp 98.1°F | Resp 16 | Ht 65.0 in | Wt 226.5 lb

## 2024-01-02 DIAGNOSIS — Z0000 Encounter for general adult medical examination without abnormal findings: Secondary | ICD-10-CM | POA: Diagnosis not present

## 2024-01-02 DIAGNOSIS — F419 Anxiety disorder, unspecified: Secondary | ICD-10-CM | POA: Diagnosis not present

## 2024-01-02 DIAGNOSIS — Z23 Encounter for immunization: Secondary | ICD-10-CM | POA: Diagnosis not present

## 2024-01-02 DIAGNOSIS — R7989 Other specified abnormal findings of blood chemistry: Secondary | ICD-10-CM

## 2024-01-02 DIAGNOSIS — Z79899 Other long term (current) drug therapy: Secondary | ICD-10-CM | POA: Diagnosis not present

## 2024-01-02 DIAGNOSIS — F320 Major depressive disorder, single episode, mild: Secondary | ICD-10-CM

## 2024-01-02 LAB — COMPREHENSIVE METABOLIC PANEL WITH GFR
ALT: 9 U/L (ref 0–35)
AST: 16 U/L (ref 0–37)
Albumin: 3.7 g/dL (ref 3.5–5.2)
Alkaline Phosphatase: 119 U/L — ABNORMAL HIGH (ref 39–117)
BUN: 16 mg/dL (ref 6–23)
CO2: 23 meq/L (ref 19–32)
Calcium: 8.8 mg/dL (ref 8.4–10.5)
Chloride: 108 meq/L (ref 96–112)
Creatinine, Ser: 1.02 mg/dL (ref 0.40–1.20)
GFR: 56.66 mL/min — ABNORMAL LOW (ref >60.00)
Glucose, Bld: 85 mg/dL (ref 70–99)
Potassium: 4.1 meq/L (ref 3.5–5.1)
Sodium: 140 meq/L (ref 135–145)
Total Bilirubin: 0.6 mg/dL (ref 0.2–1.2)
Total Protein: 6.4 g/dL (ref 6.0–8.3)

## 2024-01-02 LAB — VITAMIN D 25 HYDROXY (VIT D DEFICIENCY, FRACTURES): VITD: 31.09 ng/mL (ref 30.00–100.00)

## 2024-01-02 LAB — GAMMA GT: GGT: 14 U/L (ref 7–51)

## 2024-01-02 MED ORDER — FLUOXETINE HCL 20 MG PO TABS: 10.0000 mg | ORAL_TABLET | Freq: Every day | ORAL | Status: AC

## 2024-01-02 NOTE — Progress Notes (Signed)
 Subjective:    Patient ID: Jessica Huang, female    DOB: 20-Aug-1955, 68 y.o.   MRN: [id number]  DOS:  01/02/2024 Type of visit - description: CPX  Here for CPX. She has changed some of her medications and feeling great.  Review of Systems  A 14 point review of systems is negative    Past Medical History:  Diagnosis Date   Allergy    Anxiety    takes Xanax  daily   Anxiety and depression    takes Wellbutrin  and Prozac  daily   Back pain    buldging disc   Cataract    early   GERD (gastroesophageal reflux disease) 09-2012   dx by ENT    Headache(784.0) last time at least a yr ago   migraine-occasionally   Osteopenia 2004   Dexa, hip   PMB (postmenopausal bleeding)    Varicose veins     Past Surgical History:  Procedure Laterality Date   APPENDECTOMY     BREAST REDUCTION SURGERY Bilateral 10/25/2022   DILATATION & CURETTAGE/HYSTEROSCOPY WITH MYOSURE N/A 09/18/2018   Procedure: DILATATION & CURETTAGE/HYSTEROSCOPY WITH MYOSURE;  Surgeon: Rutherford Gain, MD;  Location: [facility];  Service: Gynecology;  Laterality: N/A;   ORIF SHOULDER FRACTURE Right 11/18/2013   Procedure: OPEN REDUCTION INTERNAL FIXATION (ORIF) RIGHT SHOULDER TUBEROSITY FRACTURE;  Surgeon: Franky CHRISTELLA Pointer, MD;  Location: [facility];  Service: Orthopedics;  Laterality: Right;   TONSILLECTOMY     VARICOSE VEIN SURGERY  2014   laser    Social History   Social History Narrative   divorce, 2 children, both live at home [place] and [place](separated)         Current Outpatient Medications  Medication Instructions   ALPRAZolam  (XANAX ) 0.25-0.5 mg, Oral, 3 times daily PRN   amLODipine  (NORVASC ) 5 mg, Oral, Daily   aspirin EC 81 mg, Daily   atorvastatin  (LIPITOR) 10 mg, Oral, Daily   buPROPion  (WELLBUTRIN  XL) 150 mg, Oral, Daily   Calcium  Carb-Cholecalciferol (CALCIUM  1000 + D PO) 1,000 mg, Daily   diclofenac  Sodium (VOLTAREN  ARTHRITIS PAIN) 1 % GEL Apply 2 g topically 4 (four) times  daily to the affected areas.   FLUoxetine  (PROZAC ) 10 mg, Oral, Daily   fluticasone  (FLONASE ) 50 MCG/ACT nasal spray 1-2 sprays, Daily PRN   Soft Lens Products (SALINE) SOLN by Does not apply route.  10mL mometasone 0.6mg /71mL Tobramycin 30mg /85mL con  Rinse- prescribed by ENT   traMADol  (ULTRAM ) 50 mg, Oral, Daily PRN       Objective:   Physical Exam BP 116/74   Pulse 76   Temp 98.1 F (36.7 C) (Oral)   Resp 16   Ht 5' 5 (1.651 m)   Wt 226 lb 8 oz (102.7 kg)   SpO2 95%   BMI 37.69 kg/m  General: Well developed, NAD, BMI noted Neck: No  thyromegaly  HEENT:  Normocephalic . Face symmetric, atraumatic Lungs:  CTA B Normal respiratory effort, no intercostal retractions, no accessory muscle use. Heart: RRR,  no murmur.  Abdomen:  Not distended, soft, non-tender. No rebound or rigidity.   Lower extremities: no pretibial edema bilaterally  Skin: Exposed areas without rash. Not pale. Not jaundice Neurologic:  alert & oriented X3.  Speech normal, gait appropriate for age and unassisted Strength symmetric and appropriate for age.  Psych: Cognition and judgment appear intact.  Cooperative with normal attention span and concentration.  Behavior appropriate. No anxious or depressed appearing.     Assessment  Assessment HTN High Cholesterol Anxiety depression Osteopenia, last dexa 06-2015, f/u gyn MSK: Back pain, hip pain GERD DX by ENT 2014 Menopause age ~ 72 Chest pain: 11-2020 stress test neg; dx -- esophageal/coronary spasm, Rx amlodipine  and isosorbide    PLAN Here for CPX - Td 11/2018- PNM 20: 2022; zostavax 2017 - Flu  shot today - Vax advise: shingrix, covid booster, - CCS: Cscope neg 1-09, cscope 09/2017 next 2029 per GI letter -Female care: sees gynecology   +FH breast ca: Per patient, follow-up at [facility], had a MMG 05/2023, breast MRI 11-2023 per pt -Labs from 09/2022: reviewed.  Will get CMP GGT vitamin D . -Diet and exercise: Discussed  Other  issues addressed.  Had blood work done elsewhere.  Reviewed. HTN: On amlodipine , self d/c Carvedilol , BP looks very well.  No change. High cholesterol: On atorvastatin , last FLP very good.  No change Anxiety depression: On Wellbutrin , self decrease fluoxetine  to 10 mg daily and doing well.  On Xanax , check UDS otherwise no change. Esophageal versus coronary spasm: Self stopped isosorbide  a while back, sxs have not resurfaced. Osteopenia: Per gynecology, T-score -1.8  (12/16/23).  Check vitamin D  Increase alkaline phosphatase: Recheck labs, including a GGT. RTC 1 year.  Sooner if needed.

## 2024-01-02 NOTE — Patient Instructions (Signed)
 Vaccines I recommend: COVID booster Shingrix   Check the  blood pressure regularly Blood pressure goal:  between 110/65 and  135/85. If it is consistently higher or lower, let me know     GO TO THE LAB :  Get the blood work   Your results will be posted on MyChart with my comments  Go to the front desk for the checkout Please make an appointment for a physical exam in 1 year

## 2024-01-02 NOTE — Assessment & Plan Note (Signed)
 Here for CPX   Other issues addressed.  Had blood work done elsewhere.  Reviewed. HTN: On amlodipine , self d/c Carvedilol , BP looks very well.  No change. High cholesterol: On atorvastatin , last FLP very good.  No change Anxiety depression: On Wellbutrin , self decrease fluoxetine  to 10 mg daily and doing well.  On Xanax , check UDS otherwise no change. Esophageal versus coronary spasm: Self stopped isosorbide  a while back, sxs have not resurfaced. Osteopenia: Per gynecology, T-score -1.8  (12/16/23).  Check vitamin D  Increase alkaline phosphatase: Recheck labs, including a GGT. RTC 1 year.  Sooner if needed.

## 2024-01-02 NOTE — Assessment & Plan Note (Signed)
 Here for CPX - Td 11/2018- PNM 20: 2022; zostavax 2017 - Flu  shot today - Vax advise: shingrix, covid booster, - CCS: Cscope neg 1-09, cscope 09/2017 next 2029 per GI letter -Female care: sees gynecology   +FH breast ca: Per patient, follow-up at [facility], had a MMG 05/2023, breast MRI 11-2023 per pt -Labs from 09/2022: reviewed.  Will get CMP GGT vitamin D . -Diet and exercise: Discussed

## 2024-01-04 LAB — DM TEMPLATE

## 2024-01-05 LAB — DRUG MONITORING PANEL 375977 , URINE
Alphahydroxyalprazolam: 117 ng/mL — ABNORMAL HIGH (ref <25)
Alphahydroxymidazolam: NEGATIVE ng/mL (ref <50)
Alphahydroxytriazolam: NEGATIVE ng/mL (ref <50)
Aminoclonazepam: NEGATIVE ng/mL (ref <25)
Amphetamines: NEGATIVE ng/mL (ref <500)
Barbiturates: NEGATIVE ng/mL (ref <300)
Benzodiazepines: POSITIVE ng/mL — AB (ref <100)
Cocaine Metabolite: NEGATIVE ng/mL (ref <150)
Desmethyltramadol: NEGATIVE ng/mL (ref <100)
Ethyl Sulfate (ETS): 897 ng/mL — ABNORMAL HIGH (ref <100)
Hydroxyethylflurazepam: NEGATIVE ng/mL (ref <50)
Lorazepam: NEGATIVE ng/mL (ref <50)
Marijuana Metabolite: NEGATIVE ng/mL (ref <20)
Nordiazepam: NEGATIVE ng/mL (ref <50)
Opiates: NEGATIVE ng/mL (ref <100)
Oxazepam: NEGATIVE ng/mL (ref <50)
Oxycodone: NEGATIVE ng/mL (ref <100)
Temazepam: NEGATIVE ng/mL (ref <50)
Tramadol Comments: 1430 ng/mL — AB (ref <500)
Tramadol: NEGATIVE ng/mL (ref <100)
Tramadol: POSITIVE ng/mL — AB (ref <500)

## 2024-01-05 LAB — DM TEMPLATE

## 2024-01-06 ENCOUNTER — Ambulatory Visit: Payer: Self-pay | Admitting: Internal Medicine

## 2024-02-10 ENCOUNTER — Other Ambulatory Visit: Payer: Self-pay

## 2024-02-10 ENCOUNTER — Other Ambulatory Visit: Payer: Self-pay | Admitting: Internal Medicine

## 2024-02-10 ENCOUNTER — Other Ambulatory Visit (HOSPITAL_BASED_OUTPATIENT_CLINIC_OR_DEPARTMENT_OTHER): Payer: Self-pay

## 2024-02-10 MED ORDER — AMLODIPINE BESYLATE 5 MG PO TABS
5.0000 mg | ORAL_TABLET | Freq: Every day | ORAL | 3 refills | Status: AC
  Filled 2024-02-10: qty 90, 90d supply, fill #0
  Filled 2024-05-04: qty 90, 90d supply, fill #1

## 2024-02-10 MED ORDER — ALPRAZOLAM 0.5 MG PO TABS
0.2500 mg | ORAL_TABLET | Freq: Three times a day (TID) | ORAL | 1 refills | Status: AC | PRN
  Filled 2024-02-10 (×2): qty 90, 30d supply, fill #0

## 2024-02-10 MED ORDER — TRAMADOL HCL 50 MG PO TABS
50.0000 mg | ORAL_TABLET | Freq: Every day | ORAL | 0 refills | Status: DC | PRN
  Filled 2024-02-10: qty 30, 30d supply, fill #0

## 2024-02-10 NOTE — Telephone Encounter (Signed)
 PDMP okay, Rx sent

## 2024-02-10 NOTE — Telephone Encounter (Signed)
 Requesting: alprazolam  and tramadol   Contract: 01/16/23 UDS: 01/02/24 Last Visit: 01/02/24 Next Visit:  01/03/25 Last Refill: see med list   Please Advise

## 2024-02-26 ENCOUNTER — Ambulatory Visit (INDEPENDENT_AMBULATORY_CARE_PROVIDER_SITE_OTHER): Admitting: *Deleted

## 2024-02-26 VITALS — Ht 65.0 in | Wt 226.0 lb

## 2024-02-26 DIAGNOSIS — Z0000 Encounter for general adult medical examination without abnormal findings: Secondary | ICD-10-CM | POA: Diagnosis not present

## 2024-02-26 NOTE — Progress Notes (Signed)
 Please attest this visit in the absence of patient primary care provider.    Chief Complaint  Patient presents with   Medicare Wellness     Subjective:   Jessica Huang is a 68 y.o. female who presents for a Medicare Annual Wellness Visit.  Allergies (verified) Penicillins   History: Past Medical History:  Diagnosis Date   Allergy    Anxiety    takes Xanax  daily   Anxiety and depression    takes Wellbutrin  and Prozac  daily   Back pain    buldging disc   Cataract    early   GERD (gastroesophageal reflux disease) 09-2012   dx by ENT    Headache(784.0) last time at least a yr ago   migraine-occasionally   Osteopenia 2004   Dexa, hip   PMB (postmenopausal bleeding)    Varicose veins    Past Surgical History:  Procedure Laterality Date   APPENDECTOMY     BREAST REDUCTION SURGERY Bilateral 10/25/2022   DILATATION & CURETTAGE/HYSTEROSCOPY WITH MYOSURE N/A 09/18/2018   Procedure: DILATATION & CURETTAGE/HYSTEROSCOPY WITH MYOSURE;  Surgeon: Rutherford Gain, MD;  Location: [facility];  Service: Gynecology;  Laterality: N/A;   ORIF SHOULDER FRACTURE Right 11/18/2013   Procedure: OPEN REDUCTION INTERNAL FIXATION (ORIF) RIGHT SHOULDER TUBEROSITY FRACTURE;  Surgeon: Franky CHRISTELLA Pointer, MD;  Location: [facility] OR;  Service: Orthopedics;  Laterality: Right;   TONSILLECTOMY     VARICOSE VEIN SURGERY  2014   laser    Family History  Problem Relation Age of Onset   Other Mother        valve replacement--M   Breast cancer Mother        M late 33s   Heart failure Mother        M   COPD Father        smoker   Diabetes Other        GPs , late onset   Colon polyps Sister    Heart attack Neg Hx    Colon cancer Neg Hx    Esophageal cancer Neg Hx    Stomach cancer Neg Hx    Rectal cancer Neg Hx    Social History   Occupational History   Occupation: retired runner, broadcasting/film/video   Tobacco Use   Smoking status: Never   Smokeless tobacco: Never  Vaping Use   Vaping status:  Never Used  Substance and Sexual Activity   Alcohol use: Not Currently   Drug use: No   Sexual activity: Not Currently    Birth control/protection: Post-menopausal   Tobacco Counseling Counseling given: Not Answered  SDOH Screenings   Food Insecurity: No Food Insecurity (02/26/2024)  Housing: Low Risk  (02/26/2024)  Transportation Needs: No Transportation Needs (02/26/2024)  Utilities: Not At Risk (02/26/2024)  Alcohol Screen: Low Risk  (02/18/2023)  Depression (PHQ2-9): Low Risk  (02/26/2024)  Financial Resource Strain: Low Risk  (02/18/2023)  Physical Activity: Insufficiently Active (02/26/2024)  Social Connections: Moderately Integrated (02/26/2024)  Stress: No Stress Concern Present (02/26/2024)  Tobacco Use: Low Risk  (02/26/2024)  Health Literacy: Adequate Health Literacy (02/18/2023)   See flowsheets for full screening details  Depression Screen PHQ 2 & 9 Depression Scale- Over the past 2 weeks, how often have you been bothered by any of the following problems? Little interest or pleasure in doing things: 0 Feeling down, depressed, or hopeless (PHQ Adolescent also includes...irritable): 0 PHQ-2 Total Score: 0 Trouble falling or staying asleep, or sleeping too much: 0 Feeling tired  or having little energy: 0 Poor appetite or overeating (PHQ Adolescent also includes...weight loss): 0 Feeling bad about yourself - or that you are a failure or have let yourself or your family down: 0 Trouble concentrating on things, such as reading the newspaper or watching television (PHQ Adolescent also includes...like school work): 0 Moving or speaking so slowly that other people could have noticed. Or the opposite - being so fidgety or restless that you have been moving around a lot more than usual: 0 Thoughts that you would be better off dead, or of hurting yourself in some way: 0 PHQ-9 Total Score: 0     Goals Addressed             This Visit's Progress    to increase exercise to  3-4 days weekly / 30 min a day         Visit info / Clinical Intake: Medicare Wellness Visit Type:: Subsequent Annual Wellness Visit Persons participating in visit:: patient Medicare Wellness Visit Mode:: Telephone If telephone:: video declined Because this visit was a virtual/telehealth visit:: pt reported vitals If Telephone or Video please confirm:: I connected with the patient using audio enabled telemedicine application and verified that I am speaking with the correct person using two identifiers; I discussed the limitations of evaluation and management by telemedicine; The patient expressed understanding and agreed to proceed Patient Location:: home Provider Location:: office Information given by:: patient Interpreter Needed?: No Pre-visit prep was completed: yes AWV questionnaire completed by patient prior to visit?: no Living arrangements:: with family/others (2 daughters live with her) Patient's Overall Health Status Rating: very good Typical amount of pain: some (depends on activities she has been doing) Does pain affect daily life?: no Are you currently prescribed opioids?: no  Dietary Habits and Nutritional Risks How many meals a day?: 2 Eats fruit and vegetables daily?: yes Most meals are obtained by: preparing own meals In the last 2 weeks, have you had any of the following?: none Diabetic:: no  Functional Status Activities of Daily Living (to include ambulation/medication): Independent Ambulation: Independent Medication Administration: Independent Home Management: Independent Manage your own finances?: yes Primary transportation is: driving Concerns about vision?: no *vision screening is required for WT[facility]* (up to date with Dr Renate) Concerns about hearing?: no  Fall Screening Falls in the past year?: 0 Number of falls in past year: 0 Was there an injury with Fall?: 0 Fall Risk Category Calculator: 0 Patient Fall Risk Level: Low Fall Risk  Fall  Risk Patient at Risk for Falls Due to: No Fall Risks Fall risk Follow up: Falls evaluation completed  Home and Transportation Safety: All rugs have non-skid backing?: yes All stairs or steps have railings?: yes Grab bars in the bathtub or shower?: (!) no Have non-skid surface in bathtub or shower?: yes Good home lighting?: yes Regular seat belt use?: yes Hospital stays in the last year:: no  Cognitive Assessment Difficulty concentrating, remembering, or making decisions? : no Will 6CIT or Mini Cog be Completed: yes What year is it?: 0 points What month is it?: 0 points Give patient an address phrase to remember (5 components): [address] [place]  About what time is it?: 0 points Count backwards from 20 to 1: 0 points Say the months of the year in reverse: 0 points Repeat the address phrase from earlier: 0 points 6 CIT Score: 0 points  Advance Directives (For Healthcare) Does Patient Have a Medical Advance Directive?: Yes Does patient want to make changes  to medical advance directive?: No - Guardian declined Type of Advance Directive: Healthcare Power of [place]; Living will Copy of Healthcare Power of Attorney in Chart?: No - copy requested Copy of Living Will in Chart?: No - copy requested  Reviewed/Updated  Reviewed/Updated: Reviewed All (Medical, Surgical, Family, Medications, Allergies, Care Teams, Patient Goals)        Objective:    Today's Vitals   02/26/24 1103  Weight: 226 lb (102.5 kg)  Height: 5' 5 (1.651 m)   Body mass index is 37.61 kg/m.  Current Medications (verified) Outpatient Encounter Medications as of 02/26/2024  Medication Sig   ALPRAZolam  (XANAX ) 0.5 MG tablet Take 0.5-1 tablets (0.25-0.5 mg total) by mouth 3 (three) times daily as needed for anxiety or sleep.   amLODipine  (NORVASC ) 5 MG tablet Take 1 tablet (5 mg total) by mouth daily.   aspirin EC 81 MG tablet Take 81 mg by mouth daily. Swallow whole.   atorvastatin  (LIPITOR) 10  MG tablet Take 1 tablet (10 mg total) by mouth daily.   buPROPion  (WELLBUTRIN  XL) 150 MG 24 hr tablet Take 1 tablet (150 mg total) by mouth daily.   Calcium  Carb-Cholecalciferol (CALCIUM  1000 + D PO) Take 1,000 mg by mouth daily.   diclofenac  Sodium (VOLTAREN  ARTHRITIS PAIN) 1 % GEL Apply 2 g topically 4 (four) times daily to the affected areas.   FLUoxetine  (PROZAC ) 20 MG tablet Take 0.5 tablets (10 mg total) by mouth daily.   fluticasone  (FLONASE ) 50 MCG/ACT nasal spray Place 1-2 sprays into both nostrils daily as needed for allergies or rhinitis.   Soft Lens Products (SALINE) SOLN by Does not apply route.  10mL mometasone 0.6mg /39mL Tobramycin 30mg /63mL con  Rinse- prescribed by ENT   traMADol  (ULTRAM ) 50 MG tablet Take 1 tablet (50 mg total) by mouth daily as needed.   No facility-administered encounter medications on file as of 02/26/2024.   Hearing/Vision screen No results found. Immunizations and Health Maintenance Health Maintenance  Topic Date Due   Zoster Vaccines- Shingrix (1 of 2) 11/03/1974   COVID-19 Vaccine (3 - Pfizer risk series) 08/12/2019   Medicare Annual Wellness (AWV)  02/25/2025   Mammogram  06/09/2025   Colonoscopy  10/21/2027   DTaP/Tdap/Td (4 - Td or Tdap) 11/15/2028   Pneumococcal Vaccine: 50+ Years  Completed   Influenza Vaccine  Completed   DEXA SCAN  Completed   Hepatitis C Screening  Completed   Meningococcal B Vaccine  Aged Out        Assessment/Plan:  This is a routine wellness examination for Arianni.  Patient Care Team: Amon Aloysius BRAVO, MD as PCP - General Rutherford Gain, MD as Consulting Physician (Obstetrics and Gynecology) Ladora Gunnar DEL., MD as Referring Physician (Gastroenterology) Arnaldo Juliene RAMAN, MD as Attending Physician (Family Medicine) Renate Lynwood Hussar, MD as Referring Physician  I have personally reviewed and noted the following in the patient's chart:   Medical and social history Use of alcohol, tobacco or illicit drugs   Current medications and supplements including opioid prescriptions. Functional ability and status Nutritional status Physical activity Advanced directives List of other physicians Hospitalizations, surgeries, and ER visits in previous 12 months Vitals Screenings to include cognitive, depression, and falls Referrals and appointments  No orders of the defined types were placed in this encounter.  In addition, I have reviewed and discussed with patient certain preventive protocols, quality metrics, and best practice recommendations. A written personalized care plan for preventive services as well as general preventive health recommendations  were provided to patient.   Lolita Libra, CMA   02/26/2024   Return in 1 year (on 02/25/2025).  After Visit Summary: (MyChart) Due to this being a telephonic visit, the after visit summary with patients personalized plan was offered to patient via MyChart   Nurse Notes: nothing significant to report

## 2024-02-26 NOTE — Patient Instructions (Addendum)
 Jessica Huang,  Thank you for taking the time for your Medicare Wellness Visit. I appreciate your continued commitment to your health goals. Please review the care plan we discussed, and feel free to reach out if I can assist you further.  Please note that Annual Wellness Visits do not include a physical exam. Some assessments may be limited, especially if the visit was conducted virtually. If needed, we may recommend an in-person follow-up with your provider.  GOALS: to increase exercise to 3-4 days a week / 30 min a day   Ongoing Care Seeing your primary care provider every 3 to 6 months helps us  monitor your health and provide consistent, personalized care.   Dr Amon:  01/03/25 10am Annual Wellness Visit: 03/01/25  Recommended Screenings:  You will need to get the following vaccines at your local pharmacy: Shingles  Health Maintenance  Topic Date Due   Zoster (Shingles) Vaccine (1 of 2) 11/03/1974   COVID-19 Vaccine (3 - Pfizer risk series) 08/12/2019   Medicare Annual Wellness Visit  02/18/2024   Breast Cancer Screening  06/09/2025   Colon Cancer Screening  10/21/2027   DTaP/Tdap/Td vaccine (4 - Td or Tdap) 11/15/2028   Pneumococcal Vaccine for age over 27  Completed   Flu Shot  Completed   DEXA scan (bone density measurement)  Completed   Hepatitis C Screening  Completed   Meningitis B Vaccine  Aged Out       02/26/2024   11:04 AM  Advanced Directives  Does Patient Have a Medical Advance Directive? Yes  Type of Estate Agent of [place];Living will  Does patient want to make changes to medical advance directive? No - Guardian declined  Copy of Healthcare Power of Attorney in Chart? No - copy requested   Bring a copy of your health care power of attorney and living will to the office to be added to your chart at your convenience. You can mail a copy to [facility] [address] [place], [place] [address] or email to  [email or web address]   Vision: Annual vision screenings are recommended for early detection of glaucoma, cataracts, and diabetic retinopathy. These exams can also reveal signs of chronic conditions such as diabetes and high blood pressure.  Dental: Annual dental screenings help detect early signs of oral cancer, gum disease, and other conditions linked to overall health, including heart disease and diabetes.  Please see the attached documents for additional preventive care recommendations.

## 2024-05-04 ENCOUNTER — Other Ambulatory Visit (HOSPITAL_BASED_OUTPATIENT_CLINIC_OR_DEPARTMENT_OTHER): Payer: Self-pay

## 2024-05-04 ENCOUNTER — Other Ambulatory Visit: Payer: Self-pay | Admitting: Internal Medicine

## 2024-05-04 MED ORDER — BUPROPION HCL ER (XL) 150 MG PO TB24
150.0000 mg | ORAL_TABLET | Freq: Every day | ORAL | 2 refills | Status: AC
  Filled 2024-05-04: qty 90, 90d supply, fill #0

## 2024-05-12 ENCOUNTER — Other Ambulatory Visit (HOSPITAL_BASED_OUTPATIENT_CLINIC_OR_DEPARTMENT_OTHER): Payer: Self-pay

## 2024-05-12 MED ORDER — TRIAMCINOLONE ACETONIDE 0.1 % EX CREA
1.0000 | TOPICAL_CREAM | Freq: Two times a day (BID) | CUTANEOUS | 1 refills | Status: AC
  Filled 2024-05-12: qty 30, 15d supply, fill #0

## 2024-05-21 ENCOUNTER — Other Ambulatory Visit: Payer: Self-pay

## 2024-05-21 ENCOUNTER — Telehealth: Payer: Self-pay | Admitting: Internal Medicine

## 2024-05-21 ENCOUNTER — Other Ambulatory Visit (HOSPITAL_BASED_OUTPATIENT_CLINIC_OR_DEPARTMENT_OTHER): Payer: Self-pay

## 2024-05-21 MED ORDER — TRAMADOL HCL 50 MG PO TABS
50.0000 mg | ORAL_TABLET | Freq: Every day | ORAL | 0 refills | Status: AC | PRN
  Filled 2024-05-21: qty 30, 30d supply, fill #0

## 2024-05-21 NOTE — Telephone Encounter (Signed)
 Requesting: tramadol  50mg   Contract: 01/16/23 UDS: 01/02/24 Last Visit: 01/02/24 Next Visit: 01/03/25 Last Refill: 02/10/24 #30 and 0RF   Please Advise

## 2024-05-21 NOTE — Telephone Encounter (Signed)
 PDMP okay, takes tramadol  sporadically, RF sent

## 2025-01-03 ENCOUNTER — Encounter: Admitting: Internal Medicine

## 2025-03-01 ENCOUNTER — Ambulatory Visit
